# Patient Record
Sex: Female | Born: 1940 | Race: Black or African American | Hispanic: No | Marital: Single | Smoking: Former smoker
Health system: Southern US, Community
[De-identification: ages and names within clinical notes are randomized; demographics above are authoritative.]

## PROBLEM LIST (undated history)

## (undated) DIAGNOSIS — I10 Essential (primary) hypertension: Secondary | ICD-10-CM

## (undated) DIAGNOSIS — J9690 Respiratory failure, unspecified, unspecified whether with hypoxia or hypercapnia: Secondary | ICD-10-CM

## (undated) DIAGNOSIS — J449 Chronic obstructive pulmonary disease, unspecified: Secondary | ICD-10-CM

## (undated) DIAGNOSIS — R0602 Shortness of breath: Secondary | ICD-10-CM

## (undated) DIAGNOSIS — R7303 Prediabetes: Secondary | ICD-10-CM

## (undated) DIAGNOSIS — I509 Heart failure, unspecified: Secondary | ICD-10-CM

## (undated) HISTORY — DX: Essential (primary) hypertension: I10

## (undated) HISTORY — DX: Chronic obstructive pulmonary disease, unspecified: J44.9

## (undated) HISTORY — DX: Prediabetes: R73.03

## (undated) HISTORY — DX: Heart failure, unspecified: I50.9

---

## 2012-08-08 DIAGNOSIS — Z23 Encounter for immunization: Secondary | ICD-10-CM | POA: Diagnosis not present

## 2013-07-15 DIAGNOSIS — I509 Heart failure, unspecified: Secondary | ICD-10-CM

## 2013-07-15 HISTORY — DX: Heart failure, unspecified: I50.9

## 2013-07-19 ENCOUNTER — Encounter (HOSPITAL_COMMUNITY): Payer: Self-pay | Admitting: Emergency Medicine

## 2013-07-19 ENCOUNTER — Emergency Department (HOSPITAL_COMMUNITY): Payer: Medicare Other

## 2013-07-19 ENCOUNTER — Inpatient Hospital Stay (HOSPITAL_COMMUNITY)
Admission: EM | Admit: 2013-07-19 | Discharge: 2013-07-29 | DRG: 291 | Disposition: A | Discharge status: Rehabilitation Facility | Payer: Medicare Other | Attending: Family Medicine | Admitting: Family Medicine

## 2013-07-19 DIAGNOSIS — I472 Ventricular tachycardia, unspecified: Secondary | ICD-10-CM | POA: Diagnosis not present

## 2013-07-19 DIAGNOSIS — I2789 Other specified pulmonary heart diseases: Secondary | ICD-10-CM | POA: Diagnosis present

## 2013-07-19 DIAGNOSIS — I10 Essential (primary) hypertension: Secondary | ICD-10-CM

## 2013-07-19 DIAGNOSIS — F17200 Nicotine dependence, unspecified, uncomplicated: Secondary | ICD-10-CM | POA: Diagnosis present

## 2013-07-19 DIAGNOSIS — J40 Bronchitis, not specified as acute or chronic: Secondary | ICD-10-CM | POA: Diagnosis not present

## 2013-07-19 DIAGNOSIS — I613 Nontraumatic intracerebral hemorrhage in brain stem: Secondary | ICD-10-CM

## 2013-07-19 DIAGNOSIS — D696 Thrombocytopenia, unspecified: Secondary | ICD-10-CM | POA: Diagnosis not present

## 2013-07-19 DIAGNOSIS — E872 Acidosis, unspecified: Secondary | ICD-10-CM | POA: Diagnosis not present

## 2013-07-19 DIAGNOSIS — G9340 Encephalopathy, unspecified: Secondary | ICD-10-CM

## 2013-07-19 DIAGNOSIS — G929 Unspecified toxic encephalopathy: Secondary | ICD-10-CM | POA: Diagnosis not present

## 2013-07-19 DIAGNOSIS — J9600 Acute respiratory failure, unspecified whether with hypoxia or hypercapnia: Secondary | ICD-10-CM | POA: Diagnosis not present

## 2013-07-19 DIAGNOSIS — I5022 Chronic systolic (congestive) heart failure: Secondary | ICD-10-CM

## 2013-07-19 DIAGNOSIS — J438 Other emphysema: Secondary | ICD-10-CM | POA: Diagnosis not present

## 2013-07-19 DIAGNOSIS — E8729 Other acidosis: Secondary | ICD-10-CM | POA: Diagnosis present

## 2013-07-19 DIAGNOSIS — I509 Heart failure, unspecified: Secondary | ICD-10-CM

## 2013-07-19 DIAGNOSIS — I4729 Other ventricular tachycardia: Secondary | ICD-10-CM | POA: Diagnosis not present

## 2013-07-19 DIAGNOSIS — I6350 Cerebral infarction due to unspecified occlusion or stenosis of unspecified cerebral artery: Secondary | ICD-10-CM | POA: Diagnosis not present

## 2013-07-19 DIAGNOSIS — G9389 Other specified disorders of brain: Secondary | ICD-10-CM | POA: Diagnosis not present

## 2013-07-19 DIAGNOSIS — K5900 Constipation, unspecified: Secondary | ICD-10-CM | POA: Diagnosis present

## 2013-07-19 DIAGNOSIS — R0602 Shortness of breath: Secondary | ICD-10-CM | POA: Diagnosis not present

## 2013-07-19 DIAGNOSIS — J441 Chronic obstructive pulmonary disease with (acute) exacerbation: Secondary | ICD-10-CM | POA: Diagnosis present

## 2013-07-19 DIAGNOSIS — I319 Disease of pericardium, unspecified: Secondary | ICD-10-CM | POA: Diagnosis not present

## 2013-07-19 DIAGNOSIS — E876 Hypokalemia: Secondary | ICD-10-CM | POA: Diagnosis not present

## 2013-07-19 DIAGNOSIS — E870 Hyperosmolality and hypernatremia: Secondary | ICD-10-CM | POA: Diagnosis present

## 2013-07-19 DIAGNOSIS — D649 Anemia, unspecified: Secondary | ICD-10-CM | POA: Diagnosis present

## 2013-07-19 DIAGNOSIS — I5031 Acute diastolic (congestive) heart failure: Principal | ICD-10-CM | POA: Diagnosis present

## 2013-07-19 DIAGNOSIS — J90 Pleural effusion, not elsewhere classified: Secondary | ICD-10-CM | POA: Diagnosis not present

## 2013-07-19 DIAGNOSIS — I169 Hypertensive crisis, unspecified: Secondary | ICD-10-CM

## 2013-07-19 DIAGNOSIS — I619 Nontraumatic intracerebral hemorrhage, unspecified: Secondary | ICD-10-CM | POA: Diagnosis not present

## 2013-07-19 DIAGNOSIS — I160 Hypertensive urgency: Secondary | ICD-10-CM

## 2013-07-19 HISTORY — DX: Respiratory failure, unspecified, unspecified whether with hypoxia or hypercapnia: J96.90

## 2013-07-19 HISTORY — DX: Heart failure, unspecified: I50.9

## 2013-07-19 HISTORY — DX: Chronic obstructive pulmonary disease, unspecified: J44.9

## 2013-07-19 HISTORY — DX: Essential (primary) hypertension: I10

## 2013-07-19 HISTORY — DX: Shortness of breath: R06.02

## 2013-07-19 LAB — URINALYSIS, ROUTINE W REFLEX MICROSCOPIC
Bilirubin Urine: NEGATIVE
Glucose, UA: NEGATIVE mg/dL
KETONES UR: NEGATIVE mg/dL
LEUKOCYTES UA: NEGATIVE
NITRITE: NEGATIVE
PROTEIN: 100 mg/dL — AB
Specific Gravity, Urine: 1.016 (ref 1.005–1.030)
UROBILINOGEN UA: 1 mg/dL (ref 0.0–1.0)
pH: 5.5 (ref 5.0–8.0)

## 2013-07-19 LAB — CBC WITH DIFFERENTIAL/PLATELET
BASOS PCT: 0 % (ref 0–1)
Basophils Absolute: 0 10*3/uL (ref 0.0–0.1)
Eosinophils Absolute: 0.1 10*3/uL (ref 0.0–0.7)
Eosinophils Relative: 2 % (ref 0–5)
HCT: 36.5 % (ref 36.0–46.0)
HEMOGLOBIN: 11.8 g/dL — AB (ref 12.0–15.0)
Lymphocytes Relative: 24 % (ref 12–46)
Lymphs Abs: 1.1 10*3/uL (ref 0.7–4.0)
MCH: 25.7 pg — ABNORMAL LOW (ref 26.0–34.0)
MCHC: 32.3 g/dL (ref 30.0–36.0)
MCV: 79.3 fL (ref 78.0–100.0)
MONOS PCT: 10 % (ref 3–12)
Monocytes Absolute: 0.5 10*3/uL (ref 0.1–1.0)
NEUTROS ABS: 2.9 10*3/uL (ref 1.7–7.7)
NEUTROS PCT: 63 % (ref 43–77)
PLATELETS: 108 10*3/uL — AB (ref 150–400)
RBC: 4.6 MIL/uL (ref 3.87–5.11)
RDW: 17.6 % — ABNORMAL HIGH (ref 11.5–15.5)
WBC: 4.6 10*3/uL (ref 4.0–10.5)

## 2013-07-19 LAB — BASIC METABOLIC PANEL
BUN: 9 mg/dL (ref 6–23)
CO2: 31 mEq/L (ref 19–32)
Calcium: 8.5 mg/dL (ref 8.4–10.5)
Chloride: 108 mEq/L (ref 96–112)
Creatinine, Ser: 0.8 mg/dL (ref 0.50–1.10)
GFR, EST AFRICAN AMERICAN: 84 mL/min — AB (ref >90)
GFR, EST NON AFRICAN AMERICAN: 72 mL/min — AB (ref >90)
Glucose, Bld: 112 mg/dL — ABNORMAL HIGH (ref 70–99)
POTASSIUM: 3.7 meq/L (ref 3.7–5.3)
SODIUM: 149 meq/L — AB (ref 137–147)

## 2013-07-19 LAB — URINE MICROSCOPIC-ADD ON

## 2013-07-19 LAB — POCT I-STAT 3, ART BLOOD GAS (G3+)
Acid-Base Excess: 4 mmol/L — ABNORMAL HIGH (ref 0.0–2.0)
Bicarbonate: 31.6 mEq/L — ABNORMAL HIGH (ref 20.0–24.0)
O2 SAT: 96 %
PCO2 ART: 63.9 mmHg — AB (ref 35.0–45.0)
PH ART: 7.302 — AB (ref 7.350–7.450)
PO2 ART: 97 mmHg (ref 80.0–100.0)
Patient temperature: 98.6
TCO2: 33 mmol/L (ref 0–100)

## 2013-07-19 LAB — PRO B NATRIURETIC PEPTIDE: PRO B NATRI PEPTIDE: 11566 pg/mL — AB (ref 0–125)

## 2013-07-19 LAB — TROPONIN I

## 2013-07-19 MED ORDER — FUROSEMIDE 10 MG/ML IJ SOLN
40.0000 mg | Freq: Once | INTRAMUSCULAR | Status: AC
  Administered 2013-07-19: 40 mg via INTRAVENOUS
  Filled 2013-07-19: qty 4

## 2013-07-19 MED ORDER — SODIUM CHLORIDE 0.9 % IJ SOLN
3.0000 mL | Freq: Two times a day (BID) | INTRAMUSCULAR | Status: DC
  Administered 2013-07-20 – 2013-07-29 (×12): 3 mL via INTRAVENOUS

## 2013-07-19 MED ORDER — ASPIRIN EC 81 MG PO TBEC
81.0000 mg | DELAYED_RELEASE_TABLET | Freq: Every day | ORAL | Status: DC
  Administered 2013-07-19 – 2013-07-21 (×3): 81 mg via ORAL
  Filled 2013-07-19 (×3): qty 1

## 2013-07-19 MED ORDER — ACETAMINOPHEN 650 MG RE SUPP: 650.0000 mg | Freq: Four times a day (QID) | RECTAL | Status: DC | PRN

## 2013-07-19 MED ORDER — FUROSEMIDE 10 MG/ML IJ SOLN
40.0000 mg | Freq: Three times a day (TID) | INTRAMUSCULAR | Status: DC
  Administered 2013-07-19 – 2013-07-20 (×3): 40 mg via INTRAVENOUS
  Filled 2013-07-19 (×4): qty 4

## 2013-07-19 MED ORDER — NITROGLYCERIN IN D5W 200-5 MCG/ML-% IV SOLN
2.0000 ug/min | INTRAVENOUS | Status: DC
Start: 2013-07-19 — End: 2013-07-20
  Administered 2013-07-19: 10 ug/min via INTRAVENOUS
  Administered 2013-07-20: 175 ug/min via INTRAVENOUS
  Filled 2013-07-19 (×4): qty 250

## 2013-07-19 MED ORDER — IPRATROPIUM-ALBUTEROL 0.5-2.5 (3) MG/3ML IN SOLN
3.0000 mL | RESPIRATORY_TRACT | Status: DC | PRN
  Administered 2013-07-28 (×2): 3 mL via RESPIRATORY_TRACT
  Filled 2013-07-19 (×2): qty 3

## 2013-07-19 MED ORDER — FUROSEMIDE 10 MG/ML IJ SOLN: 20.0000 mg | Freq: Once | INTRAMUSCULAR | Status: DC

## 2013-07-19 MED ORDER — SODIUM CHLORIDE 0.9 % IJ SOLN: 3.0000 mL | INTRAMUSCULAR | Status: DC | PRN

## 2013-07-19 MED ORDER — SODIUM CHLORIDE 0.9 % IV SOLN: 250.0000 mL | INTRAVENOUS | Status: DC | PRN

## 2013-07-19 MED ORDER — HEPARIN SODIUM (PORCINE) 5000 UNIT/ML IJ SOLN
5000.0000 [IU] | Freq: Three times a day (TID) | INTRAMUSCULAR | Status: DC
  Administered 2013-07-19 – 2013-07-21 (×5): 5000 [IU] via SUBCUTANEOUS
  Filled 2013-07-19 (×8): qty 1

## 2013-07-19 MED ORDER — LOSARTAN POTASSIUM 50 MG PO TABS
100.0000 mg | ORAL_TABLET | Freq: Every day | ORAL | Status: DC
  Administered 2013-07-19 – 2013-07-20 (×2): 100 mg via ORAL
  Filled 2013-07-19 (×2): qty 2

## 2013-07-19 MED ORDER — SODIUM CHLORIDE 0.9 % IJ SOLN
3.0000 mL | Freq: Two times a day (BID) | INTRAMUSCULAR | Status: DC
  Administered 2013-07-21 – 2013-07-27 (×6): 3 mL via INTRAVENOUS

## 2013-07-19 MED ORDER — ACETAMINOPHEN 325 MG PO TABS: 650.0000 mg | ORAL_TABLET | Freq: Four times a day (QID) | ORAL | Status: DC | PRN

## 2013-07-19 NOTE — ED Notes (Signed)
[facility] following a eval. For CHF. And hypertension. No past Hx of HTN or CHF. Pt is not taking any MEDS.

## 2013-07-19 NOTE — H&P (Signed)
Family Medicine Teaching [facility] Ad[facility] History and Physical Service Pager: [phone]  Patient name: Angelica Mcbride Medical record number: [id number] Date of birth: 04/20/1941 Age: 73 y.o. Gender: female  Primary Care Provider: No PCP Per Patient  Chief Complaint: Dysonea  Assessment and Plan: Angelica Mcbride is a 73 y.o. year old female presenting with acute congestive heart failure with the background of likely COPD and HTN. Her dyspnea is concerning for multifactorial etiology, however for now we'll focus on treating her for volume overload due to acute CHF and begin treating for COPD exacerbation if she develops fevers or does not improve as expected.  Dyspnea: Likely acute CHF on COPD. - Considering hypertensive urgency and the nitro drip we'll admit to step down unit. - Respiratory acidosis with CO2 of 63 and pH of 7.30 with some compensation versus evidence of chronic CO2 retention with bicarbonate of 31. - EKG with left axis deviation, left ventricle hypertrophy, and nonspecific flipped T waves in lateral leads - proBNP 11K, pulmonary congestion on CXR - Diurese with 40 IV Lasix TID, will hold on treatment for COPD exacerbation - Risk Stratification labs: A1c, TSH, lipid panel - Strict I/Os, TTE in the am - Rule out ACS with 2 additional Troponins and repeat EKG - Duonebs with wheezing, however this is possibly cardiac wheezing  Hypertension with Hypertensive urgency  - Nitro drip started in ED, continue in Step down - Rule out ACS - Continue  - monitor  Hypernatremia, mild - Sodium 149, follow BMP in a.m. - No treatment necessary at this time   FEN/GI: Heart healthy diet, Strict I/O, Saline Lock IV Prophylaxis: Heparin Disposition: Step down, Tele when transitioned off of Nitro drip Code Status: Full  History of Present Illness: Angelica Mcbride is a 73 y.o. year old female presenting with dyspnea for 2 weeks. She states that her current episode began with  bilateral lower extremity edema about 2 months ago. Her legs continued to swell since then. For the past 2 weeks she's noticed progressive dyspnea, 3 pillow orthopnea, and PND. She also notes a 50-pack-year history of smoking and approximately 40 years of chronic cough productive of thick yellow or green sputum. She denies any worsening cough, sputum production, fever, or chills. She states that she has not seen a doctor in over 20 years and does not know of any medical problems that she has. She denies any history of surgeries and has had one child at the age of 73. She denies headache, vision change, weakness, numbness, chest pain, hematochezia, melena, dysuria, or confusion. She states that currently it hurts to bend her knees as they are so swollen.  IOn the ED she's been treated with 40 mg IV Lasix which produced good urine output. She states that she is breathing easier since the diuretic was given.   Patient Active Problem List   Diagnosis Date Noted  . Congestive heart failure 07/19/2013  . Hypertensive urgency 07/19/2013   Past Medical History: History reviewed. No pertinent past medical history. Past Surgical History: History reviewed. No pertinent past surgical history. Social History: History  Substance Use Topics  . Smoking status: Current Every Day Smoker -- 1.00 packs/day for 51 years    Types: Cigarettes  . Smokeless tobacco: Not on file  . Alcohol Use: No   For any additional social history documentation, please refer to relevant sections of EMR.  Family History: Family History  Problem Relation Age of Onset  . Diabetes Daughter    Allergies: No Known  Allergies No current facility-administered medications on file prior to encounter.   No current outpatient prescriptions on file prior to encounter.   Review Of Systems: Per HPI, Otherwise 12 point review of systems was performed and was unremarkable.  Physical Exam: BP 179/96  Pulse 86  Temp(Src) 97.5 F (36.4  C) (Oral)  Resp 28  Ht 5' 7.5" (1.715 m)  Wt 141 lb 5 oz (64.099 kg)  BMI 21.79 kg/m2  SpO2 97% Exam: Gen: NAD, alert, cooperative with exam HEENT: NCAT, EOMI, PERRL, MMM CV: RRR, good S1/S2, no murmur Resp: Diffuse expiratory wheeze, overall diminished breath sounds, mild increased work of breathing Abd: SNTND, BS present, no guarding or organomegaly Ext: No edema, warm Neuro: Alert and oriented, strength 4/5 in BL Upper and lower extremities, EOMI, normal sensation throughout Skin: no visible lesions  Labs and Imaging: CBC BMET   Recent Labs Lab 07/19/13 1310  WBC 4.6  HGB 11.8*  HCT 36.5  PLT 108*    Recent Labs Lab 07/19/13 1310  NA 149*  K 3.7  CL 108  CO2 31  BUN 9  CREATININE 0.80  GLUCOSE 112*  CALCIUM 8.5     ProBNP: 11,566 UA: Small hemoglobin, 100 protein, Michael a with Hyline casts  Recent Labs Lab 07/19/13 1347  PHART 7.302*  PCO2ART 63.9*  PO2ART 97.0  HCO3 31.6*  TCO2 33  O2SAT 96.0   EKG with left axis deviation, left ventricle hypertrophy, and nonspecific flipped T waves in lateral leads  Kevin Fenton, MD 07/19/2013, 7:10 PM

## 2013-07-19 NOTE — ED Provider Notes (Signed)
CSN: [id number]     Arrival date & time 07/19/13  1230 History   First MD Initiated Contact with Patient 07/19/13 1253     Chief Complaint  Patient presents with  . Shortness of Breath   (Consider location/radiation/quality/duration/timing/severity/associated sxs/prior Treatment) HPI Comments: Angelica Mcbride is a 73 y.o. female who presents for evaluation of leg swelling shortness of breath and sleepiness. The bones are persistent, and ongoing and worsening for several weeks. The patient has been sitting, sleeping, up in an office chair for many years. She does not get regular medical treatments of any type. Her daughter took her to an urgent care center today, who felt that she was in acute congestive heart failure, so sent her here. The patient has not had a recent headache, chest pain, back, pain or leg pain. She does have difficulty walking, secondary to the swelling in her legs. There are no other known modifying factors.   Patient is a 74 y.o. female presenting with shortness of breath. The history is provided by the patient and a relative.  Shortness of Breath   History reviewed. No pertinent past medical history. History reviewed. No pertinent past surgical history. No family history on file. History  Substance Use Topics  . Smoking status: Never Smoker   . Smokeless tobacco: Not on file  . Alcohol Use: No   OB History   Grav Para Term Preterm Abortions TAB SAB Ect Mult Living                 Review of Systems  Respiratory: Positive for shortness of breath.   All other systems reviewed and are negative.    Allergies  Review of patient's allergies indicates no known allergies.  Home Medications   Current Outpatient Rx  Name  Route  Sig  Dispense  Refill  . acetaminophen (TYLENOL) 500 MG tablet   Oral   Take 500 mg by mouth every 8 (eight) hours as needed for mild pain.         Marland Kitchen EPINEPHrine Base (PRIMATENE MIST IN)   Inhalation   Inhale 1-2 puffs into the lungs  every 8 (eight) hours as needed (for congestion).         Marland Kitchen guaiFENesin (MUCINEX) 600 MG 12 hr tablet   Oral   Take 600 mg by mouth 2 (two) times daily as needed for to loosen phlegm.          BP 214/173  Pulse 101  Temp(Src) 97.5 F (36.4 C) (Oral)  Resp 28  Ht 5' 7.5" (1.715 m)  Wt 141 lb 5 oz (64.099 kg)  BMI 21.79 kg/m2  SpO2 93% Physical Exam  Nursing note and vitals reviewed. Constitutional: She is oriented to person, place, and time. She appears well-developed.  Elderly, frail  HENT:  Head: Normocephalic and atraumatic.  Right Ear: External ear normal.  Left Ear: External ear normal.  Eyes: Conjunctivae and EOM are normal. Pupils are equal, round, and reactive to light.  Neck: Normal range of motion and phonation normal. Neck supple.  Cardiovascular: Normal rate, regular rhythm, normal heart sounds and intact distal pulses.   Pulmonary/Chest: Effort normal. She exhibits no bony tenderness.  Decreased breath sounds, bilateral  Abdominal: Soft. Normal appearance. There is no tenderness.  Musculoskeletal: Normal range of motion. She exhibits edema. She exhibits no tenderness.  Bilateral lower extremity  Neurological: She is alert and oriented to person, place, and time. No cranial nerve deficit or sensory deficit. She exhibits normal muscle tone.  Coordination normal.  She is somewhat sleepy, but arousable and lucid.  Skin: Skin is warm, dry and intact.  Psychiatric: She has a normal mood and affect. Her behavior is normal.    ED Course  Procedures (including critical care time) Medications  nitroGLYCERIN 0.2 mg/mL in dextrose 5 % infusion (not administered)  furosemide (LASIX) injection 40 mg (40 mg Intravenous Given 07/19/13 1645)     Patient Vitals for the past 24 hrs:  BP Temp Temp src Pulse Resp SpO2 Height Weight  07/19/13 1645 214/173 mmHg - - 101 - 93 % - -  07/19/13 1630 219/127 mmHg - - 109 - 96 % - -  07/19/13 1615 174/98 mmHg - - 84 - 97 % - -   07/19/13 1600 170/96 mmHg - - 81 - 98 % - -  07/19/13 1545 194/117 mmHg - - 100 - 97 % - -  07/19/13 1530 179/105 mmHg - - 84 - 94 % - -  07/19/13 1517 194/108 mmHg 97.5 F (36.4 C) Oral 90 28 96 % - -  07/19/13 1515 191/115 mmHg - - 87 - 96 % - -  07/19/13 1500 196/123 mmHg - - 98 - 96 % - -  07/19/13 1445 192/117 mmHg - - 95 - 95 % - -  07/19/13 1430 207/118 mmHg - - 96 - 96 % - -  07/19/13 1415 191/114 mmHg - - 85 - 98 % - -  07/19/13 1400 183/106 mmHg - - 91 28 98 % - -  07/19/13 1345 189/113 mmHg - - 91 - 98 % - -  07/19/13 1330 189/110 mmHg - - 92 21 98 % - -  07/19/13 1315 174/123 mmHg - - 91 28 99 % - -  07/19/13 1312 152/92 mmHg - - 94 28 100 % 5' 7.5" (1.715 m) 141 lb 5 oz (64.099 kg)  07/19/13 1234 198/110 mmHg 97.8 F (36.6 C) Oral 93 35 92 % - -   5:25 PM Reevaluation with update and discussion. After initial assessment and treatment, an updated evaluation reveals she remains alert and comfortable. BP has increased significantly in the last hour. NTG drip ordered . Rosi Secrist L    CRITICAL CARE Performed by: Mancel BaleWENTZ,Charmin Aguiniga L Total critical care time: 50 minutes Critical care time was exclusive of separately billable procedures and treating other patients. Critical care was necessary to treat or prevent imminent or life-threatening deterioration. Critical care was time spent personally by me on the following activities: development of treatment plan with patient and/or surrogate as well as nursing, discussions with consultants, evaluation of patient's response to treatment, examination of patient, obtaining history from patient or surrogate, ordering and performing treatments and interventions, ordering and review of laboratory studies, ordering and review of radiographic studies, pulse oximetry and re-evaluation of patient's condition.  Labs Review Labs Reviewed  CBC WITH DIFFERENTIAL - Abnormal; Notable for the following:    Hemoglobin 11.8 (*)    MCH 25.7 (*)     RDW 17.6 (*)    Platelets 108 (*)    All other components within normal limits  BASIC METABOLIC PANEL - Abnormal; Notable for the following:    Sodium 149 (*)    Glucose, Bld 112 (*)    GFR calc non Af Amer 72 (*)    GFR calc Af Amer 84 (*)    All other components within normal limits  PRO B NATRIURETIC PEPTIDE - Abnormal; Notable for the following:    Pro B Natriuretic peptide (BNP)  11566.0 (*)    All other components within normal limits  URINALYSIS, ROUTINE W REFLEX MICROSCOPIC - Abnormal; Notable for the following:    Hgb urine dipstick SMALL (*)    Protein, ur 100 (*)    All other components within normal limits  URINE MICROSCOPIC-ADD ON - Abnormal; Notable for the following:    Casts HYALINE CASTS (*)    All other components within normal limits  POCT I-STAT 3, BLOOD GAS (G3+) - Abnormal; Notable for the following:    pH, Arterial 7.302 (*)    pCO2 arterial 63.9 (*)    Bicarbonate 31.6 (*)    Acid-Base Excess 4.0 (*)    All other components within normal limits  URINE CULTURE  TROPONIN I  BLOOD GAS, ARTERIAL   Imaging Review Dg Chest Port 1 View  07/19/2013   CLINICAL DATA:  Shortness of breath, history smoking  EXAM: PORTABLE CHEST - 1 VIEW  COMPARISON:  Portable exam 1703 hr without priors for comparison  FINDINGS: Enlargement of cardiac silhouette with pulmonary vascular congestion.  Mediastinal contours normal.  Bibasilar pleural effusions and atelectasis.  Minimal accentuation of perihilar markings the question bronchitis.  No definite acute pulmonary edema or segmental consolidation.  No pneumothorax.  Bones demineralized.  IMPRESSION: Enlargement of cardiac silhouette with pulmonary vascular congestion.  Bronchitic changes with bibasilar effusions and probable mild bibasilar atelectasis.   Electronically Signed   By: Ulyses Southward M.D.   On: 07/19/2013 17:17    EKG Interpretation    Date/Time:  Monday July 19 2013 12:41:34 EST Ventricular Rate:  84 PR  Interval:  159 QRS Duration: 92 QT Interval:  411 QTC Calculation: 486 R Axis:   -56 Text Interpretation:  Sinus rhythm Probable left atrial enlargement Left anterior fascicular block Abnormal R-wave progression, late transition Left ventricular hypertrophy Nonspecific T abnormalities, lateral leads ST elevation, consider inferior injury Borderline prolonged QT interval No old tracing to compare Confirmed by [facility]  MD, Obryan Radu ([id number]) on 07/19/2013 4:35:53 PM            MDM   1. Congestive heart failure   2. Hypertensive crisis    Hypertensive crisis, new onset with congestive heart failure. She will require aggressive treatment with diuresis, nitrates, and close monitoring in a step down unit. Patient remained lucid and cooperative during the evaluation. Doubt CVA, significant bacterial infection or instability.   Nursing Notes Reviewed/ Care Coordinated, and agree without changes. Applicable Imaging Reviewed.  Interpretation of Laboratory Data incorporated into ED treatment   Plan: Admit    Flint Melter, MD 07/19/13 1752

## 2013-07-20 DIAGNOSIS — I319 Disease of pericardium, unspecified: Secondary | ICD-10-CM | POA: Diagnosis not present

## 2013-07-20 DIAGNOSIS — G9340 Encephalopathy, unspecified: Secondary | ICD-10-CM | POA: Diagnosis not present

## 2013-07-20 DIAGNOSIS — J9600 Acute respiratory failure, unspecified whether with hypoxia or hypercapnia: Secondary | ICD-10-CM | POA: Diagnosis not present

## 2013-07-20 DIAGNOSIS — E872 Acidosis, unspecified: Secondary | ICD-10-CM

## 2013-07-20 DIAGNOSIS — I5031 Acute diastolic (congestive) heart failure: Secondary | ICD-10-CM | POA: Diagnosis not present

## 2013-07-20 DIAGNOSIS — I509 Heart failure, unspecified: Secondary | ICD-10-CM | POA: Diagnosis not present

## 2013-07-20 DIAGNOSIS — I10 Essential (primary) hypertension: Secondary | ICD-10-CM | POA: Diagnosis not present

## 2013-07-20 DIAGNOSIS — I6350 Cerebral infarction due to unspecified occlusion or stenosis of unspecified cerebral artery: Secondary | ICD-10-CM | POA: Diagnosis not present

## 2013-07-20 LAB — BASIC METABOLIC PANEL
BUN: 8 mg/dL (ref 6–23)
BUN: 7 mg/dL (ref 6–23)
CALCIUM: 8.2 mg/dL — AB (ref 8.4–10.5)
CALCIUM: 8.4 mg/dL (ref 8.4–10.5)
CO2: 39 meq/L — AB (ref 19–32)
CO2: 44 mEq/L (ref 19–32)
Chloride: 99 mEq/L (ref 96–112)
Chloride: 97 mEq/L (ref 96–112)
Creatinine, Ser: 0.81 mg/dL (ref 0.50–1.10)
Creatinine, Ser: 0.93 mg/dL (ref 0.50–1.10)
GFR calc Af Amer: 70 mL/min — ABNORMAL LOW (ref >90)
GFR calc non Af Amer: 71 mL/min — ABNORMAL LOW (ref >90)
GFR calc non Af Amer: 60 mL/min — ABNORMAL LOW (ref >90)
GFR, EST AFRICAN AMERICAN: 83 mL/min — AB (ref >90)
GLUCOSE: 111 mg/dL — AB (ref 70–99)
Glucose, Bld: 115 mg/dL — ABNORMAL HIGH (ref 70–99)
Potassium: 3.4 mEq/L — ABNORMAL LOW (ref 3.7–5.3)
Potassium: 3.8 mEq/L (ref 3.7–5.3)
SODIUM: 146 meq/L (ref 137–147)
Sodium: 147 mEq/L (ref 137–147)

## 2013-07-20 LAB — URINE CULTURE
Colony Count: NO GROWTH
Culture: NO GROWTH

## 2013-07-20 LAB — POCT I-STAT 3, ART BLOOD GAS (G3+)
ACID-BASE EXCESS: 17 mmol/L — AB (ref 0.0–2.0)
Bicarbonate: 45.3 mEq/L — ABNORMAL HIGH (ref 20.0–24.0)
O2 Saturation: 81 %
PO2 ART: 49 mmHg — AB (ref 80.0–100.0)
TCO2: 47 mmol/L (ref 0–100)
pCO2 arterial: 71.4 mmHg (ref 35.0–45.0)
pH, Arterial: 7.412 (ref 7.350–7.450)

## 2013-07-20 LAB — CBC
HCT: 34.7 % — ABNORMAL LOW (ref 36.0–46.0)
Hemoglobin: 11 g/dL — ABNORMAL LOW (ref 12.0–15.0)
MCH: 25.6 pg — AB (ref 26.0–34.0)
MCHC: 31.7 g/dL (ref 30.0–36.0)
MCV: 80.7 fL (ref 78.0–100.0)
PLATELETS: 96 10*3/uL — AB (ref 150–400)
RBC: 4.3 MIL/uL (ref 3.87–5.11)
RDW: 17.1 % — ABNORMAL HIGH (ref 11.5–15.5)
WBC: 7.1 10*3/uL (ref 4.0–10.5)

## 2013-07-20 LAB — LIPID PANEL
Cholesterol: 132 mg/dL (ref 0–200)
HDL: 40 mg/dL (ref >39)
LDL Cholesterol: 80 mg/dL (ref 0–99)
Total CHOL/HDL Ratio: 3.3 RATIO
Triglycerides: 62 mg/dL (ref <150)
VLDL: 12 mg/dL (ref 0–40)

## 2013-07-20 LAB — HEMOGLOBIN A1C
HEMOGLOBIN A1C: 6.3 % — AB (ref <5.7)
Mean Plasma Glucose: 134 mg/dL — ABNORMAL HIGH (ref <117)

## 2013-07-20 LAB — TROPONIN I: Troponin I: <0.3 ng/mL (ref <0.30)

## 2013-07-20 LAB — MRSA PCR SCREENING: MRSA by PCR: NEGATIVE

## 2013-07-20 LAB — TSH: TSH: 0.657 u[IU]/mL (ref 0.350–4.500)

## 2013-07-20 LAB — GLUCOSE, CAPILLARY
GLUCOSE-CAPILLARY: 124 mg/dL — AB (ref 70–99)
GLUCOSE-CAPILLARY: 120 mg/dL — AB (ref 70–99)

## 2013-07-20 LAB — LACTIC ACID, PLASMA: Lactic Acid, Venous: 0.9 mmol/L (ref 0.5–2.2)

## 2013-07-20 MED ORDER — PNEUMOCOCCAL VAC POLYVALENT 25 MCG/0.5ML IJ INJ
0.5000 mL | INJECTION | INTRAMUSCULAR | Status: AC
  Administered 2013-07-25: 0.5 mL via INTRAMUSCULAR
  Filled 2013-07-20 (×2): qty 0.5

## 2013-07-20 MED ORDER — FUROSEMIDE 10 MG/ML IJ SOLN
40.0000 mg | Freq: Every day | INTRAMUSCULAR | Status: DC
  Administered 2013-07-21 – 2013-07-23 (×3): 40 mg via INTRAVENOUS
  Filled 2013-07-20 (×4): qty 4

## 2013-07-20 MED ORDER — POTASSIUM CHLORIDE CRYS ER 20 MEQ PO TBCR
40.0000 meq | EXTENDED_RELEASE_TABLET | Freq: Once | ORAL | Status: AC
  Administered 2013-07-20: 40 meq via ORAL
  Filled 2013-07-20: qty 2

## 2013-07-20 MED ORDER — HYDROCHLOROTHIAZIDE 10 MG/ML ORAL SUSPENSION: 6.2500 mg | Freq: Every day | ORAL | Status: DC

## 2013-07-20 MED ORDER — INFLUENZA VAC SPLIT QUAD 0.5 ML IM SUSP
0.5000 mL | INTRAMUSCULAR | Status: AC
  Administered 2013-07-25: 0.5 mL via INTRAMUSCULAR
  Filled 2013-07-20 (×2): qty 0.5

## 2013-07-20 MED ORDER — AMLODIPINE BESYLATE 5 MG PO TABS
5.0000 mg | ORAL_TABLET | Freq: Every day | ORAL | Status: DC
  Administered 2013-07-20: 5 mg via ORAL
  Filled 2013-07-20 (×2): qty 1

## 2013-07-20 MED ORDER — LOSARTAN POTASSIUM 50 MG PO TABS
50.0000 mg | ORAL_TABLET | Freq: Every day | ORAL | Status: DC
  Administered 2013-07-21: 50 mg via ORAL
  Filled 2013-07-20 (×3): qty 1

## 2013-07-20 NOTE — Progress Notes (Signed)
Family Medicine Teaching Service Daily Progress Note Intern Pager: [phone]  Patient name: Angelica Mcbride Medical record number: [id number] Date of birth: 29-Mar-1941 Age: 73 y.o. Gender: female  Primary Care Provider: No PCP Per Patient Consultants: None Code Status: Full  Pt Overview and Major Events to Date:  1/5: Admitted for acute CHF  Assessment and Plan: Arnetta Odeh is a 73 y.o. year old female presenting with acute congestive heart failure with the background of likely COPD and HTN. Her dyspnea is concerning for multifactorial etiology, however for now we'll focus on treating her for volume overload due to acute CHF and begin treating for COPD exacerbation if she develops fevers or does not improve as expected.   Acute CHF: History of worsening dyspnea in setting of no medical care. BNP: 11566.0.  - CXR with cardiac enlargement, pulmonary congestion, and bibasilar effusions on bronchitic changes - ECG 1/6 unchanged from 1/5: LAD, LVH, LAFB, and nonspecific flipped T waves in lateral leads  - Continue to diurese with Lasix 40mg  IV TID  - Recheck BMP tomorrow AM - Strict I/Os: ~1L charted UOP - TTE ordered - Risk stratification labs: Hb A1c and TSH pending.  - Lipid panel wnl, LDL 80  Hypertension with Hypertensive urgency  - Still elevated BP (152-246/72-133) though improved as NTG gtt increased (currently 200 mcg/min) - Troponin neg x3  COPD: - Respiratory acidosis with CO2 of 63 and pH of 7.30 with some compensation versus evidence of chronic CO2 retention with bicarbonate of 31.  - will hold on treatment for COPD exacerbation  - Duonebs with wheezing, however this is possibly cardiac wheezing   Hypernatremia, mild: Resolved  - Sodium 149 > 146  FEN/GI: Heart healthy diet, Strict I/O, Saline Lock IV  Prophylaxis: Heparin   Disposition: Continue ntg gtt in SDU, transfer to telemetry once off nitroglycerin  Subjective: Pt denies pain. No CP, SOB, dizziness, HA. No  appetite. Daughter at bedside reports good sleep and ability to get to bedside commode. Declines nicotine patch.   Objective: Temp:  [97.5 F (36.4 C)-98.3 F (36.8 C)] 98.1 F (36.7 C) (01/06 0815) Pulse Rate:  [69-110] 103 (01/06 0815) Resp:  [19-36] 24 (01/06 0815) BP: (152-249)/(72-173) 168/73 mmHg (01/06 0815) SpO2:  [91 %-100 %] 96 % (01/06 0815) Weight:  [128 lb 12 oz (58.4 kg)-141 lb 5 oz (64.099 kg)] 128 lb 12 oz (58.4 kg) (01/06 0500) Physical Exam: Gen: elderly female in NAD sleeping in bed HEENT: NCAT, EOMI, PERRL, MMM  CV: RRR, no murmur  Resp: Diffuse expiratory wheeze, overall diminished breath sounds, mild increased work of breathing  Abd: SNTND, BS present, no guarding or organomegaly  Ext: No edema, warm, no tenderness in bilateral legs.  Neuro: Alert and oriented, strength 4/5 in BL Upper and lower extremities, EOMI, normal sensation throughout   Laboratory:  Recent Labs Lab 07/19/13 1310  WBC 4.6  HGB 11.8*  HCT 36.5  PLT 108*    Recent Labs Lab 07/19/13 1310 07/20/13 0730  NA 149* 146  K 3.7 3.4*  CL 108 99  CO2 31 39*  BUN 9 8  CREATININE 0.80 0.81  CALCIUM 8.5 8.2*  GLUCOSE 112* 115*    07/20/2013 07:30  Cholesterol 132  Triglycerides 62  HDL 40  LDL (calc) 80  VLDL 12  Total CHOL/HDL Ratio 3.3    07/19/2013 13:08  Pro B Natriuretic peptide (BNP) 11566.0 (H)    07/19/2013 13:21  Color, Urine YELLOW  APPearance CLEAR  Specific Gravity, Urine  1.016  pH 5.5  Glucose NEGATIVE  Bilirubin Urine NEGATIVE  Ketones, ur NEGATIVE  Protein 100 (A)  Urobilinogen, UA 1.0  Nitrite NEGATIVE  Leukocytes, UA NEGATIVE  Hgb urine dipstick SMALL (A)  WBC, UA 0-2  RBC / HPF 0-2  Squamous Epithelial / LPF RARE  Bacteria, UA RARE  Casts HYALINE CASTS (A)   Imaging/Diagnostic Tests: CXR 1VW FINDINGS:  Enlargement of cardiac silhouette with pulmonary vascular  congestion.  Mediastinal contours normal.  Bibasilar pleural effusions and  atelectasis.  Minimal accentuation of perihilar markings the question bronchitis.  No definite acute pulmonary edema or segmental consolidation.  No pneumothorax.  Bones demineralized.  IMPRESSION:  Enlargement of cardiac silhouette with pulmonary vascular  congestion.  Bronchitic changes with bibasilar effusions and probable mild  bibasilar atelectasis.   Hazeline Junkeryan Grunz, MD 07/20/2013, 8:41 AM PGY-1, [facility] FPTS Intern pager: [phone], text pages welcome

## 2013-07-20 NOTE — H&P (Signed)
FMTS Attending Admission Note: Keimani Laufer MD [phone] pager office [phone] I  have seen and examined this patient, reviewed their chart. I have discussed this patient with the resident. I agree with the resident's findings, assessment and care plan. 

## 2013-07-20 NOTE — Progress Notes (Addendum)
Called to pt room for worsening lethargy and confusion. Patient markedly somnolent, poor air movement.  GCS of 11. Obtained ABG for concerns of worsening CO2 narcosis and acidosis; CBG normal PH normal, elevated CO2 but fully compensated.  Hypoxia, pt off Oxygen.  Continue Supplemental O2 Blood pressure is markedly lower than in the past.  Has responded extraordinarily well to 5 of amlodipine and 100 of losartan.  Nitroglycerin was stopped and over the next 45 minutes patient's mental status significantly improved with BP returning to SBPs 150s-160s.  Goal <180/110 Likely hypoperfusion due to relative hypotension. Lactate normal.  Continue to monitor.  Andrena MewsMichael D Glender [place], DO [facility] Resident - PGY-3 07/20/2013 11:58 PM

## 2013-07-20 NOTE — Progress Notes (Signed)
Brief Progress Note:  ECHO results reviewed.  No Systolic dysfunction noted but likely diastolic dysfunction given moderate LVH and pulmonary HTN.  BP improved but still elevated.  Plan to add CCB to avoid BB effect on COPD and no indication for HF.  Plan to wean off NITRO.  Lungs with poor air movement  throughout but without wheezes.  No crackles. 2+/4 pitting edema but legs appear to have decreased in size given skin turgor changes.  Continue to diuresis. Will plan to transfer out of SDU once off Nitro drip.  Andrena MewsMichael D Jolan [place], DO [facility] Resident - PGY-3 07/20/2013 2:49 PM

## 2013-07-20 NOTE — Progress Notes (Signed)
  Echocardiogram 2D Echocardiogram has been performed.  Angelica Mcbride, Angelica Mcbride 07/20/2013, 11:53 AM

## 2013-07-20 NOTE — Progress Notes (Signed)
2015: Called MD d/t pt being much more lethargic and unable to answer questions.   2200: Pt much more alert and able to appropriately answer questions and interact in a conversation.

## 2013-07-20 NOTE — Progress Notes (Signed)
FMTS Attending  Note: Angelica Kelsay,MD I  have seen and examined this patient, reviewed their chart. I have discussed this patient with the resident. I agree with the resident's findings, assessment and care plan.  Patient's BP seem to be improving, plan to add another class of antihypertensive to her current regimen of Losartan 100 mg and Lasix. To taper off Nitroglycerin drip. Monitor I/Os and daily weight for CHF, she seem to be doing well and getting well diuresis with Lasix 40 mg IV TID, continue that for now. F/U risk stratification labs and ECHO.

## 2013-07-20 NOTE — Progress Notes (Signed)
CRITICAL VALUE ALERT  Critical value received:  CO2 40  Date of notification:  07-20-13  Time of notification:  2304  Critical value read back:yes  Nurse who received alert:  Neville RouteAllison Horton, RN  MD notified (1st page):  MD aware of results d/t ABG drawn earlier

## 2013-07-20 NOTE — Care Management Note (Addendum)
    Page 1 of 2   07/29/2013     2:44:34 PM   CARE MANAGEMENT NOTE 07/29/2013  Patient:  Angelica Mcbride,Angelica Mcbride   Account Number:  [id number]  Date Initiated:  07/20/2013  Documentation initiated by:  Junius CreamerWELL,DEBBIE  Subjective/Objective Assessment:   adm w chf, htn     Action/Plan:   lives alone   Anticipated DC Date:  07/29/2013   Anticipated DC Plan:  LONG TERM ACUTE CARE (LTAC)  In-house referral  NA      DC Planning Services  CM consult      [facility]  [facility]  DURABLE MEDICAL EQUIPMENT   Choice offered to / List presented to:  NA   DME arranged  WALKER - Lavone NianOLLING      DME agency  [facility]     [facility] L[facility] arranged  HH-2 PT      Status of service:  Completed, signed off Medicare Important Message given?   (If response is "NO", the following Medicare IM given date fields will be blank) Date Medicare IM given:   Date Additional Medicare IM given:    Discharge Disposition:  HOME W HOME HEALTH SERVICES  Per UR Regulation:  Reviewed for med. necessity/level of care/duration of stay  If discussed at Long Length of Stay Meetings, dates discussed:   07/27/2013    Comments:  07/29/2013 Patient was evlauted for CIR today however, CIR/[name] determined patient  doing well enough to d/c home. HH:  PT and rolling walker.  [name]:  [facility]:  [name]. D/c date 07/29/2013 Donato Schultzrystal Hutchinson RN, BSN, MSHL, CCM 07/29/2013  1/6 0845 debbie dowell rn,bsn will moniter for dc planning needs as pt progresses.

## 2013-07-21 ENCOUNTER — Inpatient Hospital Stay (HOSPITAL_COMMUNITY): Payer: Medicare Other

## 2013-07-21 DIAGNOSIS — G9340 Encephalopathy, unspecified: Secondary | ICD-10-CM | POA: Diagnosis not present

## 2013-07-21 DIAGNOSIS — I509 Heart failure, unspecified: Secondary | ICD-10-CM

## 2013-07-21 DIAGNOSIS — I5031 Acute diastolic (congestive) heart failure: Secondary | ICD-10-CM | POA: Diagnosis not present

## 2013-07-21 DIAGNOSIS — I10 Essential (primary) hypertension: Secondary | ICD-10-CM | POA: Diagnosis not present

## 2013-07-21 DIAGNOSIS — I6350 Cerebral infarction due to unspecified occlusion or stenosis of unspecified cerebral artery: Secondary | ICD-10-CM | POA: Diagnosis not present

## 2013-07-21 DIAGNOSIS — J9600 Acute respiratory failure, unspecified whether with hypoxia or hypercapnia: Secondary | ICD-10-CM | POA: Diagnosis not present

## 2013-07-21 LAB — BASIC METABOLIC PANEL
BUN: 7 mg/dL (ref 6–23)
BUN: 9 mg/dL (ref 6–23)
CALCIUM: 8.2 mg/dL — AB (ref 8.4–10.5)
CHLORIDE: 98 meq/L (ref 96–112)
CO2: 42 mEq/L (ref 19–32)
CO2: 35 mEq/L — ABNORMAL HIGH (ref 19–32)
CREATININE: 0.84 mg/dL (ref 0.50–1.10)
CREATININE: 0.8 mg/dL (ref 0.50–1.10)
Calcium: 8.3 mg/dL — ABNORMAL LOW (ref 8.4–10.5)
Chloride: 94 mEq/L — ABNORMAL LOW (ref 96–112)
GFR calc Af Amer: 79 mL/min — ABNORMAL LOW (ref >90)
GFR calc non Af Amer: 68 mL/min — ABNORMAL LOW (ref >90)
GFR calc non Af Amer: 72 mL/min — ABNORMAL LOW (ref >90)
GFR, EST AFRICAN AMERICAN: 84 mL/min — AB (ref >90)
Glucose, Bld: 102 mg/dL — ABNORMAL HIGH (ref 70–99)
Glucose, Bld: 88 mg/dL (ref 70–99)
Potassium: 3.7 mEq/L (ref 3.7–5.3)
Potassium: 3.6 mEq/L — ABNORMAL LOW (ref 3.7–5.3)
Sodium: 147 mEq/L (ref 137–147)
Sodium: 147 mEq/L (ref 137–147)

## 2013-07-21 LAB — POCT I-STAT 3, ART BLOOD GAS (G3+)
ACID-BASE EXCESS: 17 mmol/L — AB (ref 0.0–2.0)
Acid-Base Excess: 16 mmol/L — ABNORMAL HIGH (ref 0.0–2.0)
Bicarbonate: 46.5 mEq/L — ABNORMAL HIGH (ref 20.0–24.0)
Bicarbonate: 47.1 mEq/L — ABNORMAL HIGH (ref 20.0–24.0)
O2 SAT: 94 %
O2 Saturation: 98 %
PCO2 ART: 86.3 mmHg — AB (ref 35.0–45.0)
PCO2 ART: 85.4 mmHg — AB (ref 35.0–45.0)
PH ART: 7.34 — AB (ref 7.350–7.450)
TCO2: 49 mmol/L (ref 0–100)
TCO2: 50 mmol/L (ref 0–100)
pH, Arterial: 7.349 — ABNORMAL LOW (ref 7.350–7.450)
pO2, Arterial: 126 mmHg — ABNORMAL HIGH (ref 80.0–100.0)
pO2, Arterial: 79 mmHg — ABNORMAL LOW (ref 80.0–100.0)

## 2013-07-21 LAB — MAGNESIUM: Magnesium: 1.3 mg/dL — ABNORMAL LOW (ref 1.5–2.5)

## 2013-07-21 LAB — TROPONIN I: Troponin I: <0.3 ng/mL

## 2013-07-21 MED ORDER — MAGNESIUM SULFATE 50 % IJ SOLN
4.0000 g | Freq: Once | INTRAVENOUS | Status: AC
  Administered 2013-07-21: 4 g via INTRAVENOUS
  Filled 2013-07-21 (×2): qty 8

## 2013-07-21 MED ORDER — POTASSIUM CHLORIDE 10 MEQ/100ML IV SOLN
10.0000 meq | INTRAVENOUS | Status: AC
  Administered 2013-07-21 (×3): 10 meq via INTRAVENOUS
  Filled 2013-07-21 (×3): qty 100

## 2013-07-21 MED ORDER — MAGNESIUM SULFATE 40 MG/ML IJ SOLN
2.0000 g | Freq: Once | INTRAMUSCULAR | Status: AC
  Administered 2013-07-22: 2 g via INTRAVENOUS
  Filled 2013-07-21: qty 50

## 2013-07-21 NOTE — Progress Notes (Signed)
Family Practice Teaching Service Interval Progress Note  Received call from nursing that patient had additional run of Vtach. Murtis SinkSam Bradshaw and I went to evaluate the patient and she continued to be altered. She has been on BiPAP for the past 3 hours and has not had an improvement in her CO2. She has bounced back and forth between bradycardia and tachycardia. Nursing prior to our arrival placed a call to CCM to have them discuss the patient with us. They will come evaluate the patient. A stat head CT was ordered. Orders were placed for troponins to be cycled. EKG was being done while in the room. Will follow-up on BMET and mag as well. Will continue to monitor the patient closely this evening. Appreciate CCM's input on this patient.  Marikay AlarEric Towanna Avery, MD Family Medicine PGY-2 Service Pager [phone]

## 2013-07-21 NOTE — Progress Notes (Signed)
Strict Intake and Output is ordered.  Due to incontinence, most output has not been accurately charted.  All unmeasured events are charted in Epic under unmeasured urine occurences.

## 2013-07-21 NOTE — Clinical Documentation Improvement (Signed)
THIS DOCUMENT IS NOT A PERMANENT PART OF THE MEDICAL RECORD  Please update your documentation with the medical record to reflect your response to this query. If you need help knowing how to do this please call [phone].  07/21/13  Dear Doctor,   Noted diagnosis of "hypertensive urgency". In the Coding world this term equates to "simple benign hypertension". If this is not what you intended, please clarify in Notes and DC summary to illustrate severity of illness and risk of mortality. Thank you.  Possible Clinical Conditions?  - Hypertension - Accelerated Hypertension - Malignant Hypertension - Other Condition (please specify)  Supporting Information: - 214/173-179/96 - NTG drip  You may use possible, probable, or suspect with inpatient documentation. Possible, probable, suspected diagnoses MUST be documented at the time of discharge.  Reviewed: additional documentation in the medical record See addendum to progress note 07/22/13  Thank You, Alycia Rossettiyan B. Jarvis NewcomerGrunz, MD, PGY-1 07/22/2013 4:47 PM

## 2013-07-21 NOTE — Progress Notes (Signed)
CRITICAL VALUE ALERT  Critical value received:  CO2 42  Date of notification:  07-21-2013  Time of notification:  0413  Critical value read back:yes  Nurse who received alert:  Neville RouteAllison Horton, RN  MD notified (1st page):  MD aware of previous critical, lab value improving

## 2013-07-21 NOTE — Progress Notes (Signed)
Family Medicine Teaching Service Daily Progress Note Intern Pager: [phone]  Patient name: Angelica Mcbride Medical record number: [id number] Date of birth: October 14, 1940 Age: 73 y.o. Gender: female  Primary Care Provider: No PCP Per Patient Consultants: None Code Status: Full  Pt Overview and Major Events to Date:  1/5: Admitted for acute CHF, lasix 40mg  IV TID 1/6: Off NTG gtt, losartan and norvasc started 1/7: Down nearly 20 lbs from admission. Decrease lasix to 40mg  IV daily  Assessment and Plan: Angelica Mcbride is a 73 y.o. year old female presenting with acute congestive heart failure with the background of likely COPD and HTN.   Acute CHF: History of worsening dyspnea in setting of no medical care. BNP: 11566.0, CXR with pulmonary congestion and cardiac enlargement. - 2D ECHO: EF 55%, likely diastolic dysfunction with moderate LVH, pulmonary HTN.  - ECG 1/6 unchanged from 1/5: LAD, LVH, LAFB, and nonspecific flipped T waves in lateral leads  - Slow diuresis with Lasix 40mg  IV daily, Cr stable 0.84  - Strict I/Os: Poorly charted quantity due to incontinence, assume more negative balance than charted given weight loss of ~20lbs. since admission - Risk stratification labs: Hb A1c 6.3, TSH 0.657, LDL 80  Hypertension with Hypertensive urgency  - Off NTG gtt 1/6, on amlodipine 5mg  and losartan 50mg   Encephalopathy:  - Relative hypotension and hypoxemia yesterday leading to lethargy/confusion, improved off NTG gtt - Continues this AM, holding norvasc due to BPs 150s/80s while in the room today - Continues to retain CO2, worsening since admission 71.4 yesterday evening, 80 today => Start BiPAP and reassess.  - MRI head to evaluate for stroke/other causes of AMS in setting of hypertensive urgency.   COPD: Presumptive Dx with 50 pack-year hx, 40 year hx chronic cough. Bronchitic changes on CXR. CO2 retention on ABG.  - Respiratory acidosis with CO2 of 63 and pH of 7.30 with some compensation  versus evidence of chronic CO2 retention with bicarbonate of 31. Repeat ABG with worsening hypercarbia and hypoxemia. Starting BiPAP with repeat ABG in 2-3 hours.  - Avoiding beta-blocker - Duonebs prn wheezing  Hypernatremia, mild: Resolved 1/6  Anemia, mild, normocytic: hgb 11.0, continue to monitor.   FEN/GI: Heart healthy diet, Strict I/O, Saline Lock IV  Prophylaxis: Heparin   Disposition: Continue respiratory management in SDU  Subjective: Pt had episode of lethargy yesterday evening. Pt poorly arousable this AM. Oriented to person only, daughter at bedside reports confusion without agitation, and that she is less lethargic than yesterday evening. No respiratory distress reported.   Objective: Temp:  [98.1 F (36.7 C)-99.8 F (37.7 C)] 99.8 F (37.7 C) (01/07 0400) Pulse Rate:  [79-107] 85 (01/07 0700) Resp:  [20-41] 41 (01/07 0700) BP: (123-176)/(49-116) 173/83 mmHg (01/07 0700) SpO2:  [91 %-100 %] 93 % (01/07 0700) Weight:  [122 lb 9.2 oz (55.6 kg)] 122 lb 9.2 oz (55.6 kg) (01/07 0500) Physical Exam: Gen: elderly female in NAD sleeping soundly HEENT: NCAT, EOMI, PERRL, MMM  CV: RRR, no murmur  Resp: Diffuse expiratory wheeze, overall diminished breath sounds, mild increased work of breathing  Abd: SNTND, BS present, no guarding or organomegaly  Ext: 2+ edema, warm, no tenderness in bilateral legs.  Neuro: Poorly arousable, oriented x1. Follows some commands. Protecting airway  Laboratory:  Recent Labs Lab 07/19/13 1310 07/20/13 2204  WBC 4.6 7.1  HGB 11.8* 11.0*  HCT 36.5 34.7*  PLT 108* 96*    Recent Labs Lab 07/20/13 0730 07/20/13 2204 07/21/13 0213  NA 146  147 147  K 3.4* 3.8 3.7  CL 99 97 98  CO2 39* 44* 42*  BUN 8 7 7   CREATININE 0.81 0.93 0.84  CALCIUM 8.2* 8.4 8.2*  GLUCOSE 115* 111* 102*    07/20/2013 07:30  Cholesterol 132  Triglycerides 62  HDL 40  LDL (calc) 80  VLDL 12  Total CHOL/HDL Ratio 3.3    07/19/2013 16:1013:10  Pro B Natriuretic  peptide (BNP) 11566.0 (H)    07/19/2013 13:21  Color, Urine YELLOW  APPearance CLEAR  Specific Gravity, Urine 1.016  pH 5.5  Glucose NEGATIVE  Bilirubin Urine NEGATIVE  Ketones, ur NEGATIVE  Protein 100 (A)  Urobilinogen, UA 1.0  Nitrite NEGATIVE  Leukocytes, UA NEGATIVE  Hgb urine dipstick SMALL (A)  WBC, UA 0-2  RBC / HPF 0-2  Squamous Epithelial / LPF RARE  Bacteria, UA RARE  Casts HYALINE CASTS (A)   ECHO 1/6 - Left ventricle: The cavity size was normal. Wall thickness was increased in a pattern of moderate LVH. The estimated ejection fraction was 55%. Regional wall motion abnormalities cannot be excluded. - Mitral valve: Mild regurgitation. - Left atrium: The atrium was mildly dilated. - Pulmonary arteries: PA peak pressure: 44mm Hg (S). - Pericardium, extracardiac: A small to moderate pericardial effusion was identified posterior to the heart. There was no evidence of hemodynamic compromise.  Imaging/Diagnostic Tests: CXR 1VW FINDINGS:  Enlargement of cardiac silhouette with pulmonary vascular  congestion.  Mediastinal contours normal.  Bibasilar pleural effusions and atelectasis.  Minimal accentuation of perihilar markings the question bronchitis.  No definite acute pulmonary edema or segmental consolidation.  No pneumothorax.  Bones demineralized.  IMPRESSION:  Enlargement of cardiac silhouette with pulmonary vascular  congestion.  Bronchitic changes with bibasilar effusions and probable mild  bibasilar atelectasis.  [name], MD 07/21/2013, 7:18 AM PGY-1, [facility] FPTS Intern pager: [phone], text pages welcome

## 2013-07-21 NOTE — Progress Notes (Signed)
FMTS Attending  Note: Angelica Stanczyk,MD I  have seen and examined this patient, reviewed their chart. I have discussed this patient with the resident. I agree with the resident's findings, assessment and care plan.  Patient denies any concern today however she was not so communicative, as per her daughter who was by her bedside, this is new for her, patient moves her head back and forth intermittently and seem a little drowsy. Could be developing change in mental status,?? Anoxic encephalopathy vs delirium. Plan to obtain MRI to r/o acute stroke in the setting of hypertensive emergency. Her BP has improved a lot since yesterday.Continue current BP regimen as listed and will monitor BP over the next 24 hrs.

## 2013-07-21 NOTE — Consult Note (Signed)
Name: Camera Krienke MRN: [id number] DOB: 11-13-1940    ADMISSION DATE:  07/19/2013 CONSULTATION DATE:  07/21/13  REFERRING MD :  Lum Babe PRIMARY SERVICE: FMTS  CHIEF COMPLAINT:  Dyspnea  BRIEF PATIENT DESCRIPTION: Angelica Mcbride is a 73 year old female minimal contact with healthcare system who presented with acute CHF, hypertensive urgency,   Chronic hypercapnia, and subsequently developed encephalopathy during hospital stay.  SIGNIFICANT EVENTS / STUDIES:  07/20/13 TTE >>>> LVEF 55% 07/21/13 CT head >>>>  LINES / TUBES: 07/19/13 LUE PIV  CULTURES: None  ANTIBIOTICS: None  HISTORY OF PRESENT ILLNESS:  Angelica Mcbride is a 73 year old female who presented to [facility] on 07/19/13 with progressive dyspnea, orthopnea, and lower extremity edema.  She had not seen a physician in 20 years.  She was diagnosed with acute CHF, hypertensive urgency, and likely COPD.  She was treated with IV lasix and diuresed ~20 lbs since admission. On hospital day 2 she was noted to have increased lethargy confusion and on day 3 was noted to have 2 runs of asymptomatic Vtach.  She was placed on BIPAP for worsening hypercapnia. A head CT is pending.  PAST MEDICAL HISTORY :  History reviewed. No pertinent past medical history. History reviewed. No pertinent past surgical history. Prior to Admission medications   Medication Sig Start Date End Date Taking? Authorizing Provider  acetaminophen (TYLENOL) 500 MG tablet Take 500 mg by mouth every 8 (eight) hours as needed for mild pain.   Yes Historical Provider, MD  EPINEPHrine Base (PRIMATENE MIST IN) Inhale 1-2 puffs into the lungs every 8 (eight) hours as needed (for congestion).   Yes Historical Provider, MD  guaiFENesin (MUCINEX) 600 MG 12 hr tablet Take 600 mg by mouth 2 (two) times daily as needed for to loosen phlegm.   Yes Historical Provider, MD   No Known Allergies  FAMILY HISTORY:  Family History  Problem Relation Age of Onset  . Diabetes Daughter    SOCIAL  HISTORY:  reports that she has been smoking Cigarettes.  She has a 51 pack-year smoking history. She does not have any smokeless tobacco history on file. She reports that she does not drink alcohol. Her drug history is not on file.  REVIEW OF SYSTEMS:  Unable to obtain secondary to mental status.  SUBJECTIVE: Patient non-verbal at this time.  VITAL SIGNS: Temp:  [97.7 F (36.5 C)-100 F (37.8 C)] 97.7 F (36.5 C) (01/07 1707) Pulse Rate:  [60-107] 98 (01/07 1707) Resp:  [14-41] 22 (01/07 1707) BP: (123-202)/(49-116) 167/102 mmHg (01/07 1707) SpO2:  [91 %-100 %] 100 % (01/07 1707) Weight:  [122 lb 9.2 oz (55.6 kg)] 122 lb 9.2 oz (55.6 kg) (01/07 0500) HEMODYNAMICS:   VENTILATOR SETTINGS:   INTAKE / OUTPUT: Intake/Output     01/06 0701 - 01/07 0700 01/07 0701 - 01/08 0700   I.V. (mL/kg) 728.3 (13.1)    Total Intake(mL/kg) 728.3 (13.1)    Urine (mL/kg/hr) 200 (0.1) 400 (0.6)   Total Output 200 400   Net +528.3 -400        Urine Occurrence 4 x      PHYSICAL EXAMINATION: General:  Laying in bed, on BIPAP, moves head from side to side to signify "no" to all questions Neuro:  Follows command to squeeze fingers, will not open eyes. HEENT:  [place]/AT Cardiovascular:  RRR, no murmur Lungs:  CTA no wheezing Abdomen:  Soft, normoactive bowel sounds, nontender Musculoskeletal:  No pedal edema Skin:  Warm, dry  LABS:  CBC  Recent Labs Lab 07/19/13 1310 07/20/13 2204  WBC 4.6 7.1  HGB 11.8* 11.0*  HCT 36.5 34.7*  PLT 108* 96*   BMET  Recent Labs Lab 07/20/13 0730 07/20/13 2204 07/21/13 0213  NA 146 147 147  K 3.4* 3.8 3.7  CL 99 97 98  CO2 39* 44* 42*  BUN 8 7 7   CREATININE 0.81 0.93 0.84  GLUCOSE 115* 111* 102*   Electrolytes  Recent Labs Lab 07/20/13 0730 07/20/13 2204 07/21/13 0213  CALCIUM 8.2* 8.4 8.2*   Sepsis Markers  Recent Labs Lab 07/20/13 2204  LATICACIDVEN 0.9   ABG  Recent Labs Lab 07/20/13 2104 07/21/13 1219 07/21/13 1709  PHART  7.412 7.340* 7.349*  PCO2ART 71.4* 86.3* 85.4*  PO2ART 49.0* 126.0* 79.0*   Cardiac Enzymes  Recent Labs Lab 07/19/13 1310 07/20/13 0020 07/20/13 0730  TROPONINI <0.30 <0.30 <0.30  PROBNP 11566.0*  --   --    Glucose  Recent Labs Lab 07/20/13 1754 07/20/13 2104  GLUCAP 124* 120*    Imaging CXR: 07/19/13 PORTABLE CHEST - 1 VIEW  COMPARISON: Portable exam 1703 hr without priors for comparison  FINDINGS:  Enlargement of cardiac silhouette with pulmonary vascular  congestion.  Mediastinal contours normal.  Bibasilar pleural effusions and atelectasis.  Minimal accentuation of perihilar markings the question bronchitis.  No definite acute pulmonary edema or segmental consolidation.  No pneumothorax.  Bones demineralized.  IMPRESSION:  Enlargement of cardiac silhouette with pulmonary vascular  congestion.  Bronchitic changes with bibasilar effusions and probable mild  bibasilar atelectasis.   ASSESSMENT / PLAN:  1. Acute resp failure, mostly all chronic COPD ABG, edema improved on pcxr but may have exac by afterload, at risk PE, -Stop BiPAP and evaluate rr and distress, may not have required much of NIMV clinically , abg does not require this, likley co2 at baseline 80 -Avoid super oxygenation -may require spiral CT chest -pcxr in am   2. AMS - Multiple possible etiology including hypertensive enceph, underperfusion with lower MAP from NTg in chronic HTn - poor autoreguation at lower MAP, r/o acute cva, TIA  Vs poor autoregulation -Lactic acid  -need STAT ct head, not done as of yet will expedite -MAP goal 25% reduction from prior highest MAP = 100 -dc NTG, which will also can increase ICP -if BP rises, and need control add int hydral, if drip needed- nicardipine  3. V Tach - Patient without symptoms during 15-17 beat run [place] today. Likely all hypomag, hypok, r/o ischemia -aggressive mag, k supp, trop, ecg 4. CHF - appears euvolemic at this time. Minimal leg  swelling.    GlobaL; autoregulation vs cva / tia, needs STAT ct head, seems improved to some extent with BP allowed to rise, see MAP goals, may need neuro to see, aggressive mag, k  supp I have personally obtained a history, examined the patient, evaluated laboratory and imaging results, formulated the assessment and plan and placed orders. CRITICAL CARE: The patient is critically ill with multiple organ systems failure and requires high complexity decision making for assessment and support, frequent evaluation and titration of therapies, application of advanced monitoring technologies and extensive interpretation of multiple databases. Critical Care Time devoted to patient care services described in this note is 35 minutes.   Mcarthur Rossettianiel J. Tyson AliasFeinstein, MD, FACP Pgr: [phone] [facility]  Carlynn PurlErik Hoffman, DO PGY-1 Internal Medicine Pager [phone] 07/21/2013, 6:59 PM

## 2013-07-21 NOTE — Clinical Documentation Improvement (Signed)
THIS DOCUMENT IS NOT A PERMANENT PART OF THE MEDICAL RECORD  Please update your documentation with the medical record to reflect your response to this query. If you need help knowing how to do this please call [phone].  07/21/13   Dear Doctor,  Patient with Acute/chronic CHF and COPD exacerbation found to be in "mild to moderate respiratory distress". Please clarify the acuity to better illustrate this patient's severity of illness and risk of mortality. Thank you.  Possible Clinical Conditions? - Acute respiratory distress - Acute respiratory failure  - Acute on chronic respiratory failure  - Other Condition (please specify)   Supporting Information: - Respiratory acidosis - 1/5 CO2 42, 1/6 CO2 40 - O2 2L [place] - increased lethargy, confusion, somnolence  You may use possible, probable, or suspect with inpatient documentation. possible, probable, suspected diagnoses MUST be documented at the time of discharge  Reviewed: additional documentation in the medical record Please see addendum to progress note 07/22/13  Thank You, Alycia Rossettiyan B. Jarvis NewcomerGrunz, MD, PGY-1 07/22/2013 4:48 PM

## 2013-07-22 ENCOUNTER — Inpatient Hospital Stay (HOSPITAL_COMMUNITY): Payer: Medicare Other

## 2013-07-22 DIAGNOSIS — I619 Nontraumatic intracerebral hemorrhage, unspecified: Secondary | ICD-10-CM | POA: Diagnosis not present

## 2013-07-22 DIAGNOSIS — I509 Heart failure, unspecified: Secondary | ICD-10-CM | POA: Diagnosis not present

## 2013-07-22 DIAGNOSIS — J9600 Acute respiratory failure, unspecified whether with hypoxia or hypercapnia: Secondary | ICD-10-CM | POA: Diagnosis not present

## 2013-07-22 DIAGNOSIS — I10 Essential (primary) hypertension: Secondary | ICD-10-CM | POA: Diagnosis not present

## 2013-07-22 DIAGNOSIS — I6350 Cerebral infarction due to unspecified occlusion or stenosis of unspecified cerebral artery: Secondary | ICD-10-CM | POA: Diagnosis not present

## 2013-07-22 DIAGNOSIS — I5031 Acute diastolic (congestive) heart failure: Secondary | ICD-10-CM | POA: Diagnosis not present

## 2013-07-22 DIAGNOSIS — G9340 Encephalopathy, unspecified: Secondary | ICD-10-CM | POA: Diagnosis not present

## 2013-07-22 LAB — BASIC METABOLIC PANEL
BUN: 10 mg/dL (ref 6–23)
CO2: 39 mEq/L — ABNORMAL HIGH (ref 19–32)
CREATININE: 0.8 mg/dL (ref 0.50–1.10)
Calcium: 8.2 mg/dL — ABNORMAL LOW (ref 8.4–10.5)
Chloride: 95 mEq/L — ABNORMAL LOW (ref 96–112)
GFR calc Af Amer: 84 mL/min — ABNORMAL LOW (ref >90)
GFR, EST NON AFRICAN AMERICAN: 72 mL/min — AB (ref >90)
Glucose, Bld: 95 mg/dL (ref 70–99)
Potassium: 4.2 mEq/L (ref 3.7–5.3)
SODIUM: 146 meq/L (ref 137–147)

## 2013-07-22 LAB — MAGNESIUM: Magnesium: 3.1 mg/dL — ABNORMAL HIGH (ref 1.5–2.5)

## 2013-07-22 LAB — LACTIC ACID, PLASMA: Lactic Acid, Venous: 0.9 mmol/L (ref 0.5–2.2)

## 2013-07-22 LAB — TROPONIN I: Troponin I: <0.3 ng/mL (ref <0.30)

## 2013-07-22 MED ORDER — HYDRALAZINE HCL 20 MG/ML IJ SOLN: 10.0000 mg | INTRAMUSCULAR | Status: DC | PRN

## 2013-07-22 MED ORDER — HYDRALAZINE HCL 20 MG/ML IJ SOLN
10.0000 mg | INTRAMUSCULAR | Status: DC | PRN
  Administered 2013-07-22 – 2013-07-25 (×7): 10 mg via INTRAVENOUS
  Filled 2013-07-22 (×6): qty 1

## 2013-07-22 MED ORDER — HYDRALAZINE HCL 20 MG/ML IJ SOLN
10.0000 mg | INTRAMUSCULAR | Status: DC | PRN
  Administered 2013-07-22: 10 mg via INTRAVENOUS
  Filled 2013-07-22: qty 1

## 2013-07-22 NOTE — Consult Note (Signed)
NEURO HOSPITALIST CONSULT NOTE    Reason for Consult: possible pontine bleed seen on CT head  HPI:                                                                                                                                          Majority of history obtained from the chart.   Angelica Mcbride is an 73 y.o. female presenting to hospital with acute congestive heart failure, hypernatremia, hypertensive urgency requiring Nitro drip. Patient was admitted for her worsening COPD.  On hospital day 2 she was noted to have worsening lethargy and confusion.  Patient was placed on BIPAP for worsening hypercapnia most recent pCO2 of 86 and 85. CT head was obtained showing a 4 mm high-density focus in the lower left pons which could represent hemorrhagic infarct.   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    History reviewed. No pertinent past medical history.  History reviewed. No pertinent past surgical history.  Family History  Problem Relation Age of Onset  . Diabetes Daughter      Social History:  reports that she has been smoking Cigarettes.  She has a 51 pack-year smoking history. She does not have any smokeless tobacco history on file. She reports that she does not drink alcohol. Her drug history is not on file.  No Known Allergies  MEDICATIONS:                                                                                                                     Scheduled: . furosemide  40 mg Intravenous Daily  . influenza vac split quadrivalent PF  0.5 mL Intramuscular Tomorrow-1000  . losartan  50 mg Oral Daily  . pneumococcal 23 valent vaccine  0.5 mL Intramuscular Tomorrow-1000  . sodium chloride  3 mL Intravenous Q12H  . sodium chloride  3 mL Intravenous Q12H   Continuous:  QJJ:HERDEY chloride, acetaminophen, acetaminophen, hydrALAZINE, ipratropium-albuterol, sodium chloride   ROS:  History obtained from unobtainable from patient due to mental status    Blood pressure 191/87, pulse 77, temperature 98.4 F (36.9 C), temperature source Oral, resp. rate 20, height 5' 7"  (1.702 m), weight 54.2 kg (119 lb 7.8 oz), SpO2 100.00%.   Neurologic Examination:                                                                                                       Mental Status: Alert, moaning, opens eyes to commands and tracks my movements in room.  Will not follow verbal commands, but will answer "no" when asked if she is in pain, count my fingers in all fields.  Cranial Nerves: II: Discs flat bilaterally; Visual fields grossly normal, pupils equal, round, reactive to light and accommodation III,IV, VI: ptosis not present, extra-ocular motions intact bilaterally V,VII: smile asymmetric on the left when wincing in pain, facial light touch sensation intact to noxious stimuli VIII: hearing normal bilaterally IX,X: gag reflex present XI: bilateral shoulder shrug XII: midline tongue extension without atrophy or fasciculations  Motor: Right : Upper extremity   4/5    Left:     Upper extremity   4/5  Lower extremity   4/5     Lower extremity   4/5 --follows minimal commands but withdraws to pain briskly and with good strength.  Tone and bulk:normal tone throughout; no atrophy noted Sensory: withdrawals from pain in all 4 extremities.  Deep Tendon Reflexes:  Right: Upper Extremity   Left: Upper extremity   biceps (C-5 to C-6) 2/4   biceps (C-5 to C-6) 2/4 tricep (C7) 2/4    triceps (C7) 2/4 Brachioradialis (C6) 2/4  Brachioradialis (C6) 2/4  Lower Extremity Lower Extremity  quadriceps (L-2 to L-4) 2/4   quadriceps (L-2 to L-4) 2/4 Achilles (S1) 1/4   Achilles (S1) 1/4  Plantars: Right: downgoing   Left: downgoing Cerebellar: Unable to assess due to patient not following commands.  Gait:  not able to obtain CV: pulses palpable throughout   Lab Results  Component Value Date/Time   CHOL 132 07/20/2013  7:30 AM    Results for orders placed during the hospital encounter of 07/19/13 (from the past 48 hour(s))  GLUCOSE, CAPILLARY     Status: Abnormal   Collection Time    07/20/13  5:54 PM      Result Value Range   Glucose-Capillary 124 (*) 70 - 99 mg/dL  POCT I-STAT 3, BLOOD GAS (G3+)     Status: Abnormal   Collection Time    07/20/13  9:04 PM      Result Value Range   pH, Arterial 7.412  7.350 - 7.450   pCO2 arterial 71.4 (*) 35.0 - 45.0 mmHg   pO2, Arterial 49.0 (*) 80.0 - 100.0 mmHg   Bicarbonate 45.3 (*) 20.0 - 24.0 mEq/L   TCO2 47  0 - 100 mmol/L   O2 Saturation 81.0     Acid-Base Excess 17.0 (*) 0.0 - 2.0 mmol/L   Patient temperature 99.3 F     Collection site RADIAL, ALLEN'S TEST ACCEPTABLE  Drawn by RT     Sample type ARTERIAL     Comment NOTIFIED PHYSICIAN    GLUCOSE, CAPILLARY     Status: Abnormal   Collection Time    07/20/13  9:04 PM      Result Value Range   Glucose-Capillary 120 (*) 70 - 99 mg/dL  LACTIC ACID, PLASMA     Status: None   Collection Time    07/20/13 10:04 PM      Result Value Range   Lactic Acid, Venous 0.9  0.5 - 2.2 mmol/L  BASIC METABOLIC PANEL     Status: Abnormal   Collection Time    07/20/13 10:04 PM      Result Value Range   Sodium 147  137 - 147 mEq/L   Potassium 3.8  3.7 - 5.3 mEq/L   Chloride 97  96 - 112 mEq/L   CO2 44 (*) 19 - 32 mEq/L   Comment: CRITICAL RESULT CALLED TO, READ BACK BY AND VERIFIED WITH:     HORTON,A RN 07/20/2013 2301 JORDANS     REPEATED TO VERIFY   Glucose, Bld 111 (*) 70 - 99 mg/dL   BUN 7  6 - 23 mg/dL   Creatinine, Ser 0.93  0.50 - 1.10 mg/dL   Calcium 8.4  8.4 - 10.5 mg/dL   GFR calc non Af Amer 60 (*) >90 mL/min   GFR calc Af Amer 70 (*) >90 mL/min   Comment: (NOTE)     The eGFR has been calculated using the CKD EPI equation.     This calculation has not been validated in all  clinical situations.     eGFR's persistently <90 mL/min signify possible Chronic Kidney     Disease.  CBC     Status: Abnormal   Collection Time    07/20/13 10:04 PM      Result Value Range   WBC 7.1  4.0 - 10.5 K/uL   RBC 4.30  3.87 - 5.11 MIL/uL   Hemoglobin 11.0 (*) 12.0 - 15.0 g/dL   HCT 34.7 (*) 36.0 - 46.0 %   MCV 80.7  78.0 - 100.0 fL   MCH 25.6 (*) 26.0 - 34.0 pg   MCHC 31.7  30.0 - 36.0 g/dL   RDW 17.1 (*) 11.5 - 15.5 %   Platelets 96 (*) 150 - 400 K/uL   Comment: CONSISTENT WITH PREVIOUS RESULT  BASIC METABOLIC PANEL     Status: Abnormal   Collection Time    07/21/13  2:13 AM      Result Value Range   Sodium 147  137 - 147 mEq/L   Potassium 3.7  3.7 - 5.3 mEq/L   Chloride 98  96 - 112 mEq/L   CO2 42 (*) 19 - 32 mEq/L   Comment: CRITICAL RESULT CALLED TO, READ BACK BY AND VERIFIED WITH:     HORTON,A RN 07/21/2013 0408 JORDANS     REPEATED TO VERIFY   Glucose, Bld 102 (*) 70 - 99 mg/dL   BUN 7  6 - 23 mg/dL   Creatinine, Ser 0.84  0.50 - 1.10 mg/dL   Calcium 8.2 (*) 8.4 - 10.5 mg/dL   GFR calc non Af Amer 68 (*) >90 mL/min   GFR calc Af Amer 79 (*) >90 mL/min   Comment: (NOTE)     The eGFR has been calculated using the CKD EPI equation.     This calculation has not been validated in all clinical situations.  eGFR's persistently <90 mL/min signify possible Chronic Kidney     Disease.  POCT I-STAT 3, BLOOD GAS (G3+)     Status: Abnormal   Collection Time    07/21/13 12:19 PM      Result Value Range   pH, Arterial 7.340 (*) 7.350 - 7.450   pCO2 arterial 86.3 (*) 35.0 - 45.0 mmHg   pO2, Arterial 126.0 (*) 80.0 - 100.0 mmHg   Bicarbonate 46.5 (*) 20.0 - 24.0 mEq/L   TCO2 49  0 - 100 mmol/L   O2 Saturation 98.0     Acid-Base Excess 16.0 (*) 0.0 - 2.0 mmol/L   Collection site RADIAL, ALLEN'S TEST ACCEPTABLE     Drawn by Operator     Sample type ARTERIAL     Comment NOTIFIED PHYSICIAN    BASIC METABOLIC PANEL     Status: Abnormal   Collection Time     07/21/13  5:05 PM      Result Value Range   Sodium 147  137 - 147 mEq/L   Potassium 3.6 (*) 3.7 - 5.3 mEq/L   Chloride 94 (*) 96 - 112 mEq/L   CO2 35 (*) 19 - 32 mEq/L   Glucose, Bld 88  70 - 99 mg/dL   BUN 9  6 - 23 mg/dL   Creatinine, Ser 0.80  0.50 - 1.10 mg/dL   Calcium 8.3 (*) 8.4 - 10.5 mg/dL   GFR calc non Af Amer 72 (*) >90 mL/min   GFR calc Af Amer 84 (*) >90 mL/min   Comment: (NOTE)     The eGFR has been calculated using the CKD EPI equation.     This calculation has not been validated in all clinical situations.     eGFR's persistently <90 mL/min signify possible Chronic Kidney     Disease.  MAGNESIUM     Status: Abnormal   Collection Time    07/21/13  5:05 PM      Result Value Range   Magnesium 1.3 (*) 1.5 - 2.5 mg/dL  POCT I-STAT 3, BLOOD GAS (G3+)     Status: Abnormal   Collection Time    07/21/13  5:09 PM      Result Value Range   pH, Arterial 7.349 (*) 7.350 - 7.450   pCO2 arterial 85.4 (*) 35.0 - 45.0 mmHg   pO2, Arterial 79.0 (*) 80.0 - 100.0 mmHg   Bicarbonate 47.1 (*) 20.0 - 24.0 mEq/L   TCO2 50  0 - 100 mmol/L   O2 Saturation 94.0     Acid-Base Excess 17.0 (*) 0.0 - 2.0 mmol/L   Collection site RADIAL, ALLEN'S TEST ACCEPTABLE     Drawn by Operator     Sample type ARTERIAL     Comment NOTIFIED PHYSICIAN    TROPONIN I     Status: None   Collection Time    07/21/13  7:04 PM      Result Value Range   Troponin I <0.30  <0.30 ng/mL   Comment:            Due to the release kinetics of cTnI,     a negative result within the first hours     of the onset of symptoms does not rule out     myocardial infarction with certainty.     If myocardial infarction is still suspected,     repeat the test at appropriate intervals.  LACTIC ACID, PLASMA     Status: None   Collection Time  07/21/13 11:15 PM      Result Value Range   Lactic Acid, Venous 0.9  0.5 - 2.2 mmol/L  BASIC METABOLIC PANEL     Status: Abnormal   Collection Time    07/22/13  4:52 AM       Result Value Range   Sodium 146  137 - 147 mEq/L   Potassium 4.2  3.7 - 5.3 mEq/L   Chloride 95 (*) 96 - 112 mEq/L   CO2 39 (*) 19 - 32 mEq/L   Glucose, Bld 95  70 - 99 mg/dL   BUN 10  6 - 23 mg/dL   Creatinine, Ser 0.80  0.50 - 1.10 mg/dL   Calcium 8.2 (*) 8.4 - 10.5 mg/dL   GFR calc non Af Amer 72 (*) >90 mL/min   GFR calc Af Amer 84 (*) >90 mL/min   Comment: (NOTE)     The eGFR has been calculated using the CKD EPI equation.     This calculation has not been validated in all clinical situations.     eGFR's persistently <90 mL/min signify possible Chronic Kidney     Disease.  TROPONIN I     Status: None   Collection Time    07/22/13  4:52 AM      Result Value Range   Troponin I <0.30  <0.30 ng/mL   Comment:            Due to the release kinetics of cTnI,     a negative result within the first hours     of the onset of symptoms does not rule out     myocardial infarction with certainty.     If myocardial infarction is still suspected,     repeat the test at appropriate intervals.  MAGNESIUM     Status: Abnormal   Collection Time    07/22/13  4:52 AM      Result Value Range   Magnesium 3.1 (*) 1.5 - 2.5 mg/dL    Ct Head Wo Contrast  07/22/2013   CLINICAL DATA:  Mental status changes.  Encephalopathy.  EXAM: CT HEAD WITHOUT CONTRAST  TECHNIQUE: Contiguous axial images were obtained from the base of the skull through the vertex without intravenous contrast.  COMPARISON:  None.  FINDINGS: Skull and Sinuses:No significant abnormality.  Orbits: No acute abnormality.  Brain: There is a 4 mm high-density focus in the lower left pons. There may be minimal surrounding low-attenuation.  No evidence of acute large vessel territory infarct, hydrocephalus, mass lesion, or shift. There have been perforator infarcts in the bilateral basal ganglia, the larger in the right lenticulostriate distribution affecting the caudate and anterior internal capsule. Bilateral putaminal lacunar infarcts. Right  thalamic lacunar infarct. Diffuse small vessel ischemic white matter injury.  Critical Value/emergent results were called by telephone at the time of interpretation on 07/22/2013 at 2:25 AM to Dr. Earnest Conroy", who verbally acknowledged these results.  IMPRESSION: 1. 4 mm presumed hemorrhage in the lower left pons. 2. Extensive small-vessel ischemic injury, with remote appearing bilateral perforator infarcts.   Electronically Signed   By: Jorje Guild M.D.   On: 07/22/2013 02:29   Dg Chest Port 1 View  07/21/2013   CLINICAL DATA:  Altered mental status.  Hypercarbia.  EXAM: PORTABLE CHEST - 1 VIEW  COMPARISON:  07/19/2013  FINDINGS: There is chronic cardiomegaly. Pulmonary vascularity is now within normal limits. Decrease in the small right effusion. Small left effusion is unchanged. No acute osseous abnormality.  IMPRESSION:  Improving congestive heart failure.   Electronically Signed   By: Rozetta Nunnery M.D.   On: 07/21/2013 20:24    Assessment and plan per attending neurologist  Etta Quill PA-C [facility] [phone]  07/22/2013, 1:14 PM   Assessment/Plan: 73 YO female with confusion and encephalopathy in the setting of COPD exacerbation and hypercapnia. CT obtained shows a 4 mm hyperdensity region in left lower pons concerning for bleed. If bleed ,likely hypertensive given location and  Admission BP's in 190-200's. Exam shows left lower facial weakness but no other lateralizing abnormalities.   Recommend: 1) MRI brain to confirm pontine abnormality is a hemorrhagic infarct.  2) Goal systolic BP <383 3) Hold anitplatelet therapy for now 4) Stroke risk assessment if MRI confirms pontine stroke (carotid Doppler study, echocardiogram, hemoglobin A1c, fasting lipid panel, MRA brain without contrast) 5) PT and OT consults  I personally participate in this patient's evaluation and management, including formulating the above clinical impression and management recommendations.   Rush Farmer  M.D. [facility] [phone]

## 2013-07-22 NOTE — Progress Notes (Signed)
FMTS Attending  Note: Ladonya Jerkins,MD I  have seen and examined this patient, reviewed their chart. I have discussed this patient with the resident. I agree with the resident's findings, assessment and care plan.  

## 2013-07-22 NOTE — Progress Notes (Signed)
INITIAL NUTRITION ASSESSMENT  DOCUMENTATION CODES Per approved criteria  -Not Applicable   INTERVENTION: 1.  Modify diet; resume PO diet once medically appropriate per MD discretion.  NUTRITION DIAGNOSIS: Inadequate oral intake related to inability to eat as evidenced by NPO.   Monitor:  1.  Food/Beverage; pt meeting >/=90% estimated needs with tolerance. 2.  Wt/wt change; monitor trends  Reason for Assessment: Low Braden  73 y.o. female  Admitting Dx: dyspnea  ASSESSMENT: Pt admitted with dyspnea, acute CHF, and subsequently developed AMS.  Pt underwent CT which showed possible pontine stroke.  She has left facial weakness. RD met with pt who is not oriented at time of visit.  She is able to answer questions and states she is hungry. She is currently NPO due to possible stroke.   Nutrition Focused Physical Exam: Subcutaneous Fat:  Orbital Region: WNL Upper Arm Region: WNL Thoracic and Lumbar Region: WNL  Muscle:  Temple Region: WNL Clavicle Bone Region: mild-moderate wasting Clavicle and Acromion Bone Region: WNL Scapular Bone Region: mild wasting Dorsal Hand: WNL Patellar Region: WNL Anterior Thigh Region: WNL Posterior Calf Region: WNL  Edema: none present  Height: Ht Readings from Last 1 Encounters:  07/19/13 5' 7"  (1.702 m)    Weight: Wt Readings from Last 1 Encounters:  07/22/13 119 lb 7.8 oz (54.2 kg)    Ideal Body Weight: 135 lbs  % Ideal Body Weight: 88%  Wt Readings from Last 10 Encounters:  07/22/13 119 lb 7.8 oz (54.2 kg)    Usual Body Weight: unknown  % Usual Body Weight: unable to assess  BMI:  Body mass index is 18.71 kg/(m^2).  Estimated Nutritional Needs: Kcal: 1520-1630 Protein: 65-76g Fluid: >1.5 L/day  Skin: no issues noted  Diet Order: NPO  EDUCATION NEEDS: -No education needs identified at this time   Intake/Output Summary (Last 24 hours) at 07/22/13 1432 Last data filed at 07/22/13 0800  Gross per 24 hour   Intake    390 ml  Output    425 ml  Net    -35 ml    Last BM: 1/4  Labs:   Recent Labs Lab 07/21/13 0213 07/21/13 1705 07/22/13 0452  NA 147 147 146  K 3.7 3.6* 4.2  CL 98 94* 95*  CO2 42* 35* 39*  BUN 7 9 10   CREATININE 0.84 0.80 0.80  CALCIUM 8.2* 8.3* 8.2*  MG  --  1.3* 3.1*  GLUCOSE 102* 88 95    CBG (last 3)   Recent Labs  07/20/13 1754 07/20/13 2104  GLUCAP 124* 120*    Scheduled Meds: . furosemide  40 mg Intravenous Daily  . influenza vac split quadrivalent PF  0.5 mL Intramuscular Tomorrow-1000  . losartan  50 mg Oral Daily  . pneumococcal 23 valent vaccine  0.5 mL Intramuscular Tomorrow-1000  . sodium chloride  3 mL Intravenous Q12H  . sodium chloride  3 mL Intravenous Q12H    Continuous Infusions:   History reviewed. No pertinent past medical history.  History reviewed. No pertinent past surgical history.  Brynda Greathouse, MS RD LDN Clinical Inpatient Dietitian Pager: [phone] Weekend/After hours pager: [phone]

## 2013-07-22 NOTE — Progress Notes (Signed)
Sung AmabileSimonds, MD paged regarding pt mental and respiratory status. MRI canceled due to pt instability. STAT CT of head ordered. Will continue to monitor.

## 2013-07-22 NOTE — Progress Notes (Signed)
eLink Physician-Brief Progress Note Patient Name: Faye RamsayJoan Bagsby DOB: 08-23-1940 MRN: [id number]  Date of Service  07/22/2013   HPI/Events of Note   CT head >>> 4 mm presumed hemorrhage in the lower left pons   eICU Interventions   Heparin / ASA d/c'd SCDs ordered    Intervention Category Intermediate Interventions: Communication with other healthcare providers and/or family  Lonia FarberZUBELEVITSKIY, Anne-Marie Genson 07/22/2013, 2:39 AM

## 2013-07-22 NOTE — Progress Notes (Signed)
Tyson AliasFeinstein, MD at bedside. Pt oriented to place, disoriented to time, place, and situation. MD aware. Pt taken off of BiPaP and placed on 6L [place]. Tolerating at this time. Will continue to monitor.

## 2013-07-22 NOTE — Progress Notes (Addendum)
Family Medicine Teaching Service Daily Progress Note Intern Pager: [phone]  Patient name: Angelica Mcbride Medical record number: [id number] Date of birth: 11-08-1940 Age: 73 y.o. Gender: female  Primary Care Provider: No PCP Per Patient Consultants: CCM, neurology Code Status: Full  Pt Overview and Major Events to Date:  1/5: Admitted for acute CHF, lasix 40mg  IV TID 1/6: Off NTG gtt, losartan and norvasc started 1/7: Down nearly 20 lbs from admission. Decrease lasix to 40mg  IV daily  Assessment and Plan: Angelica RamsayJoan Ennen is a 73 y.o. year old female presenting with acute congestive heart failure with the background of likely COPD and HTN.   Acute CHF: History of worsening dyspnea in setting of no medical care. BNP: 11566.0, CXR with pulmonary congestion and cardiac enlargement. - 2D ECHO: EF 55%, likely diastolic dysfunction with moderate LVH, pulmonary HTN.  - ECG 1/6 unchanged from 1/5: LAD, LVH, LAFB, and nonspecific flipped T waves in lateral leads  - Slow diuresis with Lasix 40mg  IV daily, Cr stable 0.84  - Strict I/Os: Poorly charted quantity due to incontinence, assume more negative balance than charted given weight loss of ~20lbs. 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 Allow permissive HTN given CVA.  - Off NTG gtt 1/6, amlodipine 5mg  and losartan 50mg  d/c'ed 1/8  Hemorrhagic infarct: CT head shows presumed 4mm left lower pons hemorrhage 1/8  - Stop all BP meds to allow permissive HTN in setting of chronic HTN - Anticoagulants held - MRI head today - Neurology consulted  Acute respiratory failure: Presumptive COPD dx with 50 pack-year hx, 40 year hx chronic cough. Bronchitic changes on CXR. Chronic, compensated respiratory acidosis and hypoxia on ABG. - Respiratory status stable requiring 6L O2 by [place] after trial of BiPAP  - Avoiding beta-blocker - Duonebs prn  wheezing  Paroxysmal ventricular tachycardia Noted 2 runs 1/7, given Mag. No symptoms - ECG 1/7 showed sinus rhythm with PVCs, LAD, LAFB, TWI in lateral leads; ECG 1/8 stable - Troponins neg x2  Hypomagnesemia Noted 1/7 - 1.9 > 3.1 s/p repletion  Hypernatremia, mild: Resolved 1/6  Anemia, mild, normocytic: hgb 11.0, continue to monitor.   FEN/GI: Heart healthy diet, Strict I/O, Saline Lock IV  Prophylaxis: SCDs, anti-coagulants stopped for brain bleed  Disposition: Continue respiratory management, stroke work up in [facility]  Subjective: Pt poorly arousable, oriented to person only, daughter not at bedside.   Objective: Temp:  [97.7 F (36.5 C)-99 F (37.2 C)] 99 F (37.2 C) (01/08 0400) Pulse Rate:  [60-102] 92 (01/08 0800) Resp:  [13-32] 22 (01/08 0800) BP: (127-202)/(68-110) 191/90 mmHg (01/08 0800) SpO2:  [95 %-100 %] 100 % (01/08 0800) FiO2 (%):  [100 %] 100 % (01/07 1900) Weight:  [119 lb 7.8 oz (54.2 kg)] 119 lb 7.8 oz (54.2 kg) (01/08 0500) Physical Exam: Gen: elderly female in NAD sleeping soundly HEENT: NCAT, EOMI, PERRL, MMM  CV: RRR, no murmur  Resp: Diffuse expiratory wheeze, overall diminished breath sounds, normal work of breathing  Abd: SNTND, BS present, no guarding or organomegaly  Ext: 2+ edema, warm, no tenderness in bilateral legs.  Neuro: Poorly arousable, oriented x1. Follows some commands. Protecting airway  Laboratory:  Recent Labs Lab 07/19/13 1310 07/20/13 2204  WBC 4.6 7.1  HGB 11.8* 11.0*  HCT 36.5 34.7*  PLT 108* 96*    Recent Labs Lab 07/21/13 0213 07/21/13 1705 07/22/13 0452  NA 147 147 146  K 3.7  3.6* 4.2  CL 98 94* 95*  CO2 42* 35* 39*  BUN 7 9 10   CREATININE 0.84 0.80 0.80  CALCIUM 8.2* 8.3* 8.2*  GLUCOSE 102* 88 95    07/20/2013 07:30  Cholesterol 132  Triglycerides 62  HDL 40  LDL (calc) 80  VLDL 12  Total CHOL/HDL Ratio 3.3    07/19/2013 16:10  Pro B Natriuretic peptide (BNP) 11566.0 (H)    07/19/2013 13:21   Color, Urine YELLOW  APPearance CLEAR  Specific Gravity, Urine 1.016  pH 5.5  Glucose NEGATIVE  Bilirubin Urine NEGATIVE  Ketones, ur NEGATIVE  Protein 100 (A)  Urobilinogen, UA 1.0  Nitrite NEGATIVE  Leukocytes, UA NEGATIVE  Hgb urine dipstick SMALL (A)  WBC, UA 0-2  RBC / HPF 0-2  Squamous Epithelial / LPF RARE  Bacteria, UA RARE  Casts HYALINE CASTS (A)   ECHO 1/6 - Left ventricle: The cavity size was normal. Wall thickness was increased in a pattern of moderate LVH. The estimated ejection fraction was 55%. Regional wall motion abnormalities cannot be excluded. - Mitral valve: Mild regurgitation. - Left atrium: The atrium was mildly dilated. - Pulmonary arteries: PA peak pressure: 44mm Hg (S). - Pericardium, extracardiac: A small to moderate pericardial effusion was identified posterior to the heart. There was no evidence of hemodynamic compromise.  Imaging/Diagnostic Tests: CXR 1VW FINDINGS:  Enlargement of cardiac silhouette with pulmonary vascular  congestion.  Mediastinal contours normal.  Bibasilar pleural effusions and atelectasis.  Minimal accentuation of perihilar markings the question bronchitis.  No definite acute pulmonary edema or segmental consolidation.  No pneumothorax.  Bones demineralized.  IMPRESSION:  Enlargement of cardiac silhouette with pulmonary vascular  congestion.  Bronchitic changes with bibasilar effusions and probable mild  bibasilar atelectasis.  CT HEAD WITHOUT CONTRAST  FINDINGS:  Skull and Sinuses:No significant abnormality.  Orbits: No acute abnormality.  Brain: There is a 4 mm high-density focus in the lower left pons.  There may be minimal surrounding low-attenuation.  No evidence of acute large vessel territory infarct, hydrocephalus,  mass lesion, or shift. There have been perforator infarcts in the  bilateral basal ganglia, the larger in the right lenticulostriate  distribution affecting the caudate and anterior  internal capsule.  Bilateral putaminal lacunar infarcts. Right thalamic lacunar  infarct. Diffuse small vessel ischemic white matter injury.  Critical Value/emergent results were called by telephone at the time  of interpretation on 07/22/2013 at 2:25 AM to Dr. Herma Carson", who verbally  acknowledged these results.  IMPRESSION:  1. 4 mm presumed hemorrhage in the lower left pons.  2. Extensive small-vessel ischemic injury, with remote appearing  bilateral perforator infarcts.  Hazeline Junker, MD 07/22/2013, 8:29 AM PGY-1, [facility] Family Medicine FPTS Intern pager: [phone], text pages welcome

## 2013-07-23 ENCOUNTER — Inpatient Hospital Stay (HOSPITAL_COMMUNITY): Payer: Medicare Other

## 2013-07-23 DIAGNOSIS — I509 Heart failure, unspecified: Secondary | ICD-10-CM | POA: Diagnosis not present

## 2013-07-23 DIAGNOSIS — J438 Other emphysema: Secondary | ICD-10-CM | POA: Diagnosis not present

## 2013-07-23 DIAGNOSIS — J90 Pleural effusion, not elsewhere classified: Secondary | ICD-10-CM | POA: Diagnosis not present

## 2013-07-23 DIAGNOSIS — E872 Acidosis: Secondary | ICD-10-CM | POA: Diagnosis not present

## 2013-07-23 DIAGNOSIS — I10 Essential (primary) hypertension: Secondary | ICD-10-CM | POA: Diagnosis not present

## 2013-07-23 DIAGNOSIS — I613 Nontraumatic intracerebral hemorrhage in brain stem: Secondary | ICD-10-CM | POA: Diagnosis present

## 2013-07-23 DIAGNOSIS — I619 Nontraumatic intracerebral hemorrhage, unspecified: Secondary | ICD-10-CM

## 2013-07-23 MED ORDER — LOSARTAN POTASSIUM 50 MG PO TABS
100.0000 mg | ORAL_TABLET | Freq: Every day | ORAL | Status: DC
  Filled 2013-07-23 (×2): qty 2

## 2013-07-23 MED ORDER — AMLODIPINE BESYLATE 5 MG PO TABS
5.0000 mg | ORAL_TABLET | Freq: Every day | ORAL | Status: DC
  Filled 2013-07-23 (×3): qty 1

## 2013-07-23 MED ORDER — IOHEXOL 350 MG/ML SOLN
80.0000 mL | Freq: Once | INTRAVENOUS | Status: AC | PRN
  Administered 2013-07-23: 80 mL via INTRAVENOUS

## 2013-07-23 MED ORDER — LORAZEPAM 2 MG/ML IJ SOLN: 1.0000 mg | Freq: Once | INTRAMUSCULAR | Status: DC

## 2013-07-23 NOTE — Discharge Summary (Signed)
Family Medicine Teaching [facility] Discharge Summary  Patient name: Angelica Mcbride Medical record number: [id number] Date of birth: September 10, 1940 Age: 73 y.o. Gender: female Date of Admission: 07/19/2013  Date of Discharge: 07/29/2013 Admitting Physician: Nestor Ramp, MD  Primary Care Provider: No PCP Per Patient Consultants: Neurology, critical care medicine, PM&R  Indication for Hospitalization: Acute respiratory failure  Discharge Diagnoses/Problem List:  Patient Active Problem List   Diagnosis Date Noted  . 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 Acute congestive heart failure 07/19/2013  . Hypertensive urgency 07/19/2013  . Respiratory acidosis 07/19/2013  . HTN (hypertension) 07/19/2013   Disposition: Discharged to [facility] inpatient rehabilitation  Discharge Condition: Stable  Discharge Exam:  Gen: Thin, elderly female in NAD.  HEENT: NCAT, EOMI, MMM, supple neck with full range of motion.  CV: RRR, no murmur  Resp: Non-labored, CTAB, no wheezing  Abd: SNTND, BS present, no guarding or organomegaly  Ext: no edema, warm, no tenderness in bilateral legs.  Neuro: A&O x3. Speech normal, gait steady  Brief Hospital Course:  Angelica Mcbride is a 73 y.o. 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   A nitroglycerin drip was started for severe HTN with good response, though the patient became progressively more encephalopathic with lowered blood pressures, so permissive HTN was allowed. High-dose lasix was administered with resultant 20lbs. diuresis. ABG showed marked hypercapnia without acidosis (pCO2 80), and chest x-ray showed bronchitic changes consistent with COPD due to a 50-pack-year history of smoking. Cardiac enzymes were negative and CTA was negative for PE. 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 Neurology was consulted and CVA workup included carotid dopplers without evidence of ICA stenosis, echocardiogram showing preserved EF (55%), likely LVH and likely diastolic dysfunction, and no wall motion abnormalities. Repeat head imaging showed partial resolution of pontine hemorrhage. Neurology consultant opined that the hemorrhage would not explain the encephalopathy and that no further work up was necessary. She was started on aspirin 81mg  for secondary stroke prevention.   On day 8 of admission the patient was found to be more interactive, likely due to continued resolution of hemorrhage and re-equilibration to lower blood pressures. Oral losartan 50mg  and norvasc 5mg  were started on 1/13. She will be discharged to [facility] 1/15.   Iatrogenic hypernatremia was treated by discontinuing lasix and modifying IV fluids with resolution on 1/12.  Paroxysmal ventricular tachycardia was noted on 1/7 and was resolved after administration of magnesium.  Mild normocytic anemia and thrombocytopenia were monitored throughout the admission and no transfusion was necessary.   Issues for Follow Up:  - Follow up BP control and volume status on norvasc 5mg , losartan 50mg , and lasix 40mg . Dry weight approximately 120-125lbs.  - Follow up physical therapy progress.   - 4.5 mm nodule over the right middle lobe found on CT angiogram 07/23/13. Recommend follow-up noncontrast chest CT in 6 months.   Significant Procedures: None  Significant Labs and Imaging:   Recent Labs Lab 07/26/13 0700 07/27/13 0302  WBC 11.9* 8.7  HGB 13.7 12.5  HCT 42.7 37.4  PLT 128* 143*    Recent Labs Lab 07/25/13 1830 07/26/13 0700  07/27/13 0302 07/27/13 1327 07/28/13 0245 07/29/13 0515 07/29/13 0957  NA 155* 151*  < > 139 142 139 139 138  K 3.6* 4.1  < >  3.6* 3.5* 3.4* 4.2 3.8  CL 98 99  < > 92* 95* 96 105 98  CO2 37* 36*  < > 36* 37* 33* 17* 30  GLUCOSE 95 87  < > 123* 146* 112*  81 133*  BUN 36* 34*  < > 26* 24* 20 41* 13  CREATININE 1.01 0.93  < > 0.83 1.02 0.89 2.92* 0.81  CALCIUM 8.7 8.6  < > 8.5 8.3* 8.2* 8.7 8.3*  ALKPHOS  --  47  --   --   --   --   --   --   AST  --  33  --   --   --   --   --   --   ALT  --  16  --   --   --   --   --   --   ALBUMIN  --  2.7*  --   --   --   --   --   --   < > = values in this interval not displayed.  Lab  07/20/13 2104  07/21/13 1219  07/21/13 1709  07/24/13 1110   PHART  7.412  7.340*  7.349*  7.481*   PCO2ART  71.4*  86.3*  85.4*  60.5*   PO2ART  49.0*  126.0*  79.0*  80.0   HCO3  45.3*  46.5*  47.1*  45.1*   TCO2  47  49  50  47   O2SAT  81.0  98.0  94.0  96.0     07/20/13 0020  07/20/13 0730   HGBA1C  6.3*  --   TRIG  --  62   CHOL  --  132   HDL  --  40   LDLCALC  --  80   TSH  0.657  --    Lab  07/21/13 1904  07/22/13 0452   TROPONINI  <0.30  <0.30    1/5 U/A - 100 protein, neg UTI  1/5 1V CXR - Vascular Congestion, + Bronchitis  1/7 1V CXR - Improving congestion  1/8 CT Head w/o contrast - 4mm L pons hemorrhage; extensive small-vessel ischemic changes with remote infarcts  1/8 MRI Head w/o contrast - 5mm L pons hemorrhage; 3mm L pons acute infarct; remote B basal ganglia, thalamic, cerebellar infarcts; moderate microvascular ischemia  1/9 - CTA Chest - neg for PE, + nodule, mild emphysema; small B pleural effusions; small pericardial effusion  ECHO 1/6: + LVH, EF 55% poorly visualized wall motion. PA peak pressure . Small pericardial effusion  Carotid doppler study: 1-39% B; some tortuosity with ? 40-50% at L mid ICA  Results/Tests Pending at Time of Discharge: None  Discharge Medications:    Medication List         acetaminophen 500 MG tablet  Commonly known as:  TYLENOL  Take 500 mg by mouth every 8 (eight) hours as needed for mild pain.     amLODipine 5 MG tablet  Commonly known as:  NORVASC  Take 1 tablet (5 mg total) by mouth daily.     aspirin 81 MG chewable tablet   Chew 1 tablet (81 mg total) by mouth daily.     atorvastatin 40 MG tablet  Commonly known as:  LIPITOR  Take 1 tablet (40 mg total) by mouth daily at 6 PM.     furosemide 40 MG tablet  Commonly known as:  LASIX  Take 1 tablet (40 mg total) by mouth daily.  guaiFENesin 600 MG 12 hr tablet  Commonly known as:  MUCINEX  Take 600 mg by mouth 2 (two) times daily as needed for to loosen phlegm.     losartan 50 MG tablet  Commonly known as:  COZAAR  Take 1 tablet (50 mg total) by mouth daily.     PRIMATENE MIST IN  Inhale 1-2 puffs into the lungs every 8 (eight) hours as needed (for congestion).       Discharge Instructions: Please refer to Patient Instructions section of EMR for full details.  Patient was counseled important signs and symptoms that should prompt return to medical care, changes in medications, dietary instructions, activity restrictions, and follow up appointments.   Follow-Up Appointments: Follow-up Information   Follow up with Kevin FentonBradshaw, Samuel, MD On 08/02/2013. (3:00pm)    Specialty:  Family Medicine   Contact information:   [address] [place] [address] [phone]       Hazeline Junkeryan Grunz, MD 08/01/2013, 10:02 AM PGY-1, [facility]

## 2013-07-23 NOTE — Progress Notes (Signed)
Stroke Team Progress Note  HISTORY Angelica Mcbride is an 73 y.o. female presenting to hospital 07/19/2013 with acute congestive heart failure, hypernatremia, hypertensive urgency requiring Nitro drip and worsening COPD. On hospital day 2 she was noted to have worsening lethargy and confusion. Patient was placed on BIPAP for worsening hypercapnia most recent pCO2 of 86 and 85. CT head was obtained showing a 4 mm high-density focus in the lower left pons which could represent hemorrhagic infarct. 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 History reviewed. Pt with no pertinent past medical history.  SUBJECTIVE Her daughter is at the bedside.  Overall she feels her condition is gradually improving. She has no prior h/o stroke and has not been to a Md in years and was on no medicines.  OBJECTIVE Most recent Vital Signs: Filed Vitals:   07/23/13 0500 07/23/13 0600 07/23/13 0700 07/23/13 0728  BP:  177/66 164/80 163/82  Pulse: 111 84 101 108  Temp:    98.6 F (37 C)  TempSrc:    Oral  Resp: 18 28 17 12   Height:      Weight: 51.9 kg (114 lb 6.7 oz)     SpO2: 95% 96% 96% 91%   CBG (last 3)   Recent Labs  07/20/13 1754 07/20/13 2104  GLUCAP 124* 120*    IV Fluid Intake:     MEDICATIONS  . furosemide  40 mg Intravenous Daily  . influenza vac split quadrivalent PF  0.5 mL Intramuscular Tomorrow-1000  . losartan  50 mg Oral Daily  . pneumococcal 23 valent vaccine  0.5 mL Intramuscular Tomorrow-1000  . sodium chloride  3 mL Intravenous Q12H  . sodium chloride  3 mL Intravenous Q12H   PRN:  sodium chloride, acetaminophen, acetaminophen, hydrALAZINE, ipratropium-albuterol, sodium chloride  Diet:  NPO  Activity:  Up as tolerated DVT Prophylaxis:  supraclavicular d  CLINICALLY SIGNIFICANT STUDIES Basic Metabolic Panel:  Recent Labs Lab 07/21/13 1705 07/22/13 0452  NA 147 146  K 3.6* 4.2  CL 94* 95*  CO2 35* 39*  GLUCOSE 88 95  BUN 9 10  CREATININE  0.80 0.80  CALCIUM 8.3* 8.2*  MG 1.3* 3.1*   Liver Function Tests: No results found for this basename: AST, ALT, ALKPHOS, BILITOT, PROT, ALBUMIN,  in the last 168 hours CBC:  Recent Labs Lab 07/19/13 1310 07/20/13 2204  WBC 4.6 7.1  NEUTROABS 2.9  --   HGB 11.8* 11.0*  HCT 36.5 34.7*  MCV 79.3 80.7  PLT 108* 96*   Coagulation: No results found for this basename: LABPROT, INR,  in the last 168 hours Cardiac Enzymes:  Recent Labs Lab 07/20/13 0730 07/21/13 1904 07/22/13 0452  TROPONINI <0.30 <0.30 <0.30   Urinalysis:  Recent Labs Lab 07/19/13 1321  COLORURINE YELLOW  LABSPEC 1.016  PHURINE 5.5  GLUCOSEU NEGATIVE  HGBUR SMALL*  BILIRUBINUR NEGATIVE  KETONESUR NEGATIVE  PROTEINUR 100*  UROBILINOGEN 1.0  NITRITE NEGATIVE  LEUKOCYTESUR NEGATIVE   Lipid Panel    Component Value Date/Time   CHOL 132 07/20/2013 0730   TRIG 62 07/20/2013 0730   HDL 40 07/20/2013 0730   CHOLHDL 3.3 07/20/2013 0730   VLDL 12 07/20/2013 0730   LDLCALC 80 07/20/2013 0730   HgbA1C  Lab Results  Component Value Date   HGBA1C 6.3* 07/20/2013    Urine Drug Screen:   No results found for this basename: labopia, cocainscrnur, labbenz, amphetmu, thcu, labbarb    Alcohol Level: No results found for this basename:  ETH,  in the last 168 hours   CT of the brain  07/22/2013    1. 4 mm presumed hemorrhage in the lower left pons. 2. Extensive small-vessel ischemic injury, with remote appearing bilateral perforator infarcts.     MRI of the brain  07/22/2013   1. 5 mm focus of hemorrhage in the left pons. 2. Possible 3 mm focus of acute infarct in the left pons. 3. Remote bilateral basal ganglia, thalamic, and cerebellar infarcts as above. Moderate chronic microvascular ischemia.  MRA of the brain  Not done  2D Echocardiogram  07/20/13 Normal EF  Carotid Doppler    CXR  07/21/2013    Improving congestive heart failure.   EKG  normal sinus rhythm.   Therapy Recommendations  pending  Physical Exam   Frail  elderly african american lady not in distress. t. Afebrile. Head is nontraumatic. Neck is supple without bruit. Hearing is normal. Cardiac exam no murmur or gallop. Lungs are clear to auscultation. Distal pulses are well felt. Neurological Exam ;  Awake  Alert oriented x 1.diminished attention, registration and recall. Follows 2 step commands well. Normal speech and language except mild tangentiality and confusion.Marland Kitchen.eye movements full without nystagmus.fundi were not visualized. Vision acuity and fields appear normal. Hearing is normal. Palatal movements are normal. Face symmetric. Tongue midline. Normal strength, tone, reflexes and coordination. Normal sensation. Gait deferred. ASSESSMENT  Angelica Mcbride is a 73 y.o. female who developed worsening lethary and confusion in hospital. Imaging confirms a tiny Left pontine microhemorrhage versus old calcified infarct unlikely to explain present clinical presentation. 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  On no antithrombotics prior to admission. Now on no antithrombotics secondary to hemorrhage for secondary stroke prevention.  Altered mental status likely from hypertensive emergency and CO 2 narcosis from COPd exacerbation and not from tiny Pontine microhemorrhage  Acute CHF  Malignant hypertension   Acute respiratory failure  Paroxysmal ventricular tachycardia  LDL 80  HgbA1c 6.3  Hospital day # 4  TREATMENT/PLAN  Continue to hold antipletelets for secondary stroke prevention.Repeat Ct head in am and if hemorrhage versus calcification stable stary aspirin 81 mg daily for stroke prevention as she has had multiple lacunar infarcts  Strict control of HT with BP goal below 130/90. Check TCD  D/W daughter and family Practice resident  Annie MainSHARON BIBY, MSN, RN, ANVP-BC, ANP-BC,  Lawernce IonGNP-BC [facility] Pager: [phone] 07/23/2013 8:19 AM  I have personally obtained a history, examined the patient, evaluated imaging results, and formulated the assessment and plan of care. I agree with the above. Delia HeadyPramod Sethi, MD

## 2013-07-23 NOTE — Progress Notes (Signed)
Stroke Team Progress Note  HISTORY Majority of history obtained from the chart.  Angelica Mcbride is an 73 y.o. female presenting to hospital with acute congestive heart failure, hypernatremia, hypertensive urgency requiring Nitro drip. Patient was admitted for her worsening COPD. On hospital day 2 she was noted to have worsening lethargy and confusion. Patient was placed on BIPAP for worsening hypercapnia most recent pCO2 of 86 and 85. CT head was obtained showing a 4 mm high-density focus in the lower left pons which could represent hemorrhagic infarct. 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      Patient was not a TPA candidate secondary to  Possible hemorrhage on CT. She was admitted to the  ICU  for further evaluation and treatment.  SUBJECTIVE Her  RN and daughter is at the bedside.  Overall she feels her condition is gradually improving. She is more alert and interactive  OBJECTIVE Most recent Vital Signs: Filed Vitals:   07/23/13 0800 07/23/13 0900 07/23/13 1000 07/23/13 1200  BP: 156/73 146/62 155/66 175/67  Pulse: 105 92 98   Temp:    98.3 F (36.8 C)  TempSrc:    Oral  Resp: 16 31 16    Height:      Weight:      SpO2: 96% 96% 97% 99%   CBG (last 3)   Recent Labs  07/20/13 1754 07/20/13 2104  GLUCAP 124* 120*    IV Fluid Intake:     MEDICATIONS  . amLODipine  5 mg Oral Daily  . furosemide  40 mg Intravenous Daily  . influenza vac split quadrivalent PF  0.5 mL Intramuscular Tomorrow-1000  . LORazepam  1 mg Intravenous Once  . [START ON 07/24/2013] losartan  100 mg Oral Daily  . pneumococcal 23 valent vaccine  0.5 mL Intramuscular Tomorrow-1000  . sodium chloride  3 mL Intravenous Q12H  . sodium chloride  3 mL Intravenous Q12H   PRN:  sodium chloride, acetaminophen, acetaminophen, hydrALAZINE, ipratropium-albuterol, sodium chloride  Diet:  NPO  s Activity:  Bedrest  DVT Prophylaxis:  SCDs CLINICALLY SIGNIFICANT STUDIES Basic  Metabolic Panel:  Recent Labs Lab 07/21/13 1705 07/22/13 0452  NA 147 146  K 3.6* 4.2  CL 94* 95*  CO2 35* 39*  GLUCOSE 88 95  BUN 9 10  CREATININE 0.80 0.80  CALCIUM 8.3* 8.2*  MG 1.3* 3.1*   Liver Function Tests: No results found for this basename: AST, ALT, ALKPHOS, BILITOT, PROT, ALBUMIN,  in the last 168 hours CBC:  Recent Labs Lab 07/19/13 1310 07/20/13 2204  WBC 4.6 7.1  NEUTROABS 2.9  --   HGB 11.8* 11.0*  HCT 36.5 34.7*  MCV 79.3 80.7  PLT 108* 96*   Coagulation: No results found for this basename: LABPROT, INR,  in the last 168 hours Cardiac Enzymes:  Recent Labs Lab 07/20/13 0730 07/21/13 1904 07/22/13 0452  TROPONINI <0.30 <0.30 <0.30   Urinalysis:  Recent Labs Lab 07/19/13 1321  COLORURINE YELLOW  LABSPEC 1.016  PHURINE 5.5  GLUCOSEU NEGATIVE  HGBUR SMALL*  BILIRUBINUR NEGATIVE  KETONESUR NEGATIVE  PROTEINUR 100*  UROBILINOGEN 1.0  NITRITE NEGATIVE  LEUKOCYTESUR NEGATIVE   Lipid Panel    Component Value Date/Time   CHOL 132 07/20/2013 0730   TRIG 62 07/20/2013 0730   HDL 40 07/20/2013 0730   CHOLHDL 3.3 07/20/2013 0730   VLDL 12 07/20/2013 0730   LDLCALC 80 07/20/2013 0730   HgbA1C  Lab Results  Component Value Date  HGBA1C 6.3* 07/20/2013    Urine Drug Screen:   No results found for this basename: labopia, cocainscrnur, labbenz, amphetmu, thcu, labbarb    Alcohol Level: No results found for this basename: ETH,  in the last 168 hours  Ct Head Wo Contrast  07/22/2013   CLINICAL DATA:  Mental status changes.  Encephalopathy.  EXAM: CT HEAD WITHOUT CONTRAST  TECHNIQUE: Contiguous axial images were obtained from the base of the skull through the vertex without intravenous contrast.  COMPARISON:  None.  FINDINGS: Skull and Sinuses:No significant abnormality.  Orbits: No acute abnormality.  Brain: There is a 4 mm high-density focus in the lower left pons. There may be minimal surrounding low-attenuation.  No evidence of acute large vessel territory  infarct, hydrocephalus, mass lesion, or shift. There have been perforator infarcts in the bilateral basal ganglia, the larger in the right lenticulostriate distribution affecting the caudate and anterior internal capsule. Bilateral putaminal lacunar infarcts. Right thalamic lacunar infarct. Diffuse small vessel ischemic white matter injury.  Critical Value/emergent results were called by telephone at the time of interpretation on 07/22/2013 at 2:25 AM to Dr. Herma Carson", who verbally acknowledged these results.  IMPRESSION: 1. 4 mm presumed hemorrhage in the lower left pons. 2. Extensive small-vessel ischemic injury, with remote appearing bilateral perforator infarcts.   Electronically Signed   By: Tiburcio Pea M.D.   On: 07/22/2013 02:29   Ct Angio Chest Pe W/cm &/or Wo Cm  07/23/2013   CLINICAL DATA:  Congestive heart failure. Worsening COPD. Rule out pulmonary embolism.  EXAM: CT ANGIOGRAPHY CHEST WITH CONTRAST  TECHNIQUE: Multidetector CT imaging of the chest was performed using the standard protocol during bolus administration of intravenous contrast. Multiplanar CT image reconstructions including MIPs were obtained to evaluate the vascular anatomy.  CONTRAST:  80mL OMNIPAQUE IOHEXOL 350 MG/ML SOLN  COMPARISON:  Chest x-ray 07/21/2013  FINDINGS: Lungs are adequately inflated with mild emphysematous disease. There are small bilateral pleural effusions left worse than right with associated compressive atelectasis in the left base. There is a 4.5 mm nodule over the right middle lobe. There is mild cardiomegaly. There is a small pericardial effusion. The pulmonary arterial system is within normal without evidence of emboli. There is no significant hilar, mediastinal or axillary adenopathy. There is very minimal calcification over the left anterior descending coronary artery.  Limited images through the upper abdomen demonstrate a sub cm hypodensity over the dome of the liver likely a small cyst or hemangioma. Remainder  of the exam is unremarkable.  Review of the MIP images confirms the above findings.  IMPRESSION: No evidence of pulmonary embolism.  Small pericardial effusion with mild cardiomegaly.  Mild emphysematous disease. Small bilateral pleural effusions left greater than right with associated compressive atelectasis in the left base.  4.5 mm nodule over the right middle lobe. Recommend follow-up noncontrast chest CT in 6 months. This recommendation follows the consensus statement: Guidelines for Management of Small Pulmonary Nodules Detected on CT Scans: A Statement from the Fleischner Society as published in Radiology 2005; 237:395-400. Online at: DietDisorder.cz.  Sub cm hypodensity over the dome of the liver likely a cyst or hemangioma.   Electronically Signed   By: Elberta Fortis M.D.   On: 07/23/2013 14:03   Mr Brain Wo Contrast  07/22/2013   CLINICAL DATA:  Altered mental status. Presumed pontine hemorrhage on CT.  EXAM: MRI HEAD WITHOUT CONTRAST  TECHNIQUE: Multiplanar, multiecho pulse sequences of the brain and surrounding structures were obtained without intravenous contrast.  COMPARISON:  Head CT 07/22/2013  FINDINGS: Images are moderately degraded by motion artifact.  There is question of a 3 mm focus of restricted diffusion in the left pons (series 8, image 10). As seen on recent CT, there is a 5 mm small focus of hemorrhage in the left pons. Remote pontine infarcts are noted. Remote infarcts are also noted involving the left cerebellum, bilateral basal ganglia and thalami, and left centrum semiovale. Periventricular T2 hyperintensities are compatible with moderate chronic small vessel ischemic disease. There is moderate cerebral atrophy. Two punctate, remote microhemorrhages are questioned in the right occipital lobe, with with an additional one questioned adjacent to the body of the left lateral ventricle. Major intracranial vascular flow voids are unremarkable. Orbits  are normal. Visualized paranasal sinuses and mastoid air cells are clear.  IMPRESSION: 1. 5 mm focus of hemorrhage in the left pons. 2. Possible 3 mm focus of acute infarct in the left pons. 3. Remote bilateral basal ganglia, thalamic, and cerebellar infarcts as above. Moderate chronic microvascular ischemia.   Electronically Signed   By: Sebastian AcheAllen  Grady   On: 07/22/2013 18:53   Dg Chest Port 1 View  07/21/2013   CLINICAL DATA:  Altered mental status.  Hypercarbia.  EXAM: PORTABLE CHEST - 1 VIEW  COMPARISON:  07/19/2013  FINDINGS: There is chronic cardiomegaly. Pulmonary vascularity is now within normal limits. Decrease in the small right effusion. Small left effusion is unchanged. No acute osseous abnormality.  IMPRESSION: Improving congestive heart failure.   Electronically Signed   By: Geanie CooleyJim  Maxwell M.D.   On: 07/21/2013 20:24      2D Echocardiogram  Normal EF  Carotid Doppler  40-59 % left mid ICA stenosis  CXR    EKG  Normal sinus rhythm Biatrial enlargement Left anterior fascicular block Left ventricular hypertrophy T wave abnormality, consider lateral ischemia unchanged from prior tracing Prolonged QT Therapy Recommendations pending Physical Exam   Frail elderly african american lady not in distress.on nasal oxygen mask. . Afebrile. Head is nontraumatic. Neck is supple without bruit.  . Cardiac exam no murmur or gallop. Lungs are clear to auscultation. Distal pulses are well felt. Neurological Exam : Awake and alert at present. Follows simple midline and one-step commands only. Distractible. Diminished attention, registration and recall. Disoriented to time place and person. Speaks only short sentences and few words. Extraocular moments are full range without nystagmus. Pupils irregular but reactive. Blinks to threat bilaterally. No facial weakness. Able to move all 4 extremities equally well against gravity without any focal weakness. Deep tendon reflexes are symmetric. Plantars are both  downgoing. Gait was not tested. No flaps or tremors are noted. ASSESSMENT Ms. Angelica Mcbride is a 73 y.o. female  who developed lethargy and confusion 2 days after admission for congestive heart failure, hyper cardia and hypertensive urgency. She has a nonfocal exam. CT scan of the head showed a 4 mm left pontine hyperdensity an MRI scan shows multiple remote age lacunar infarcts as well as one tiny 2 mm left pontine diffusion-positive lesions as well as a small 4 mm left pontine hemorrhage versus calcification. The patient's clinical presentation and exam findings are not compatible with acute pontine hemorrhage or infarct and these imaging findings are likely incidental and related to hypertensive microvascular disease and hypertensive urgency. Have primary neurological presentation is likely related to encephalopathy from CO2 narcosis and hypertensive urgency. On no prior to admission. Now on  no for secondary stroke prevention. Patient with resultant improving mental status and confusion  Work up underway.      Hospital day # 4  TREATMENT/PLAN  Continue ongoing medical management for her encephalopathy and strict blood pressure control. Repeat CT scan of the head in a.m. and may consider starting aspirin 81 mg if CT scan hyperdensity is decreased or stable. Mobilize patient out of bed with physical occupational therapy consults. No further stroke workup is necessary at this time.  Annie Main, MSN, RN, ANVP-BC, ANP-BC, [facility] Pager: [phone] 07/23/2013 2:54 PM  I have personally obtained a history, examined the patient, evaluated imaging results, and formulated the assessment and plan of care. I agree with the above. Delia Heady, MD

## 2013-07-23 NOTE — Progress Notes (Signed)
SLP Cancellation Note  Patient Details Name: Faye RamsayJoan Schinke MRN: [id number] DOB: 1941/04/12   Cancelled treatment:       Reason Eval/Treat Not Completed: Patient not sufficiently alert to participate in swallow assessment.  Will return next date.   Blenda MountsCouture, Carlita Whitcomb Laurice 07/23/2013, 2:42 PM

## 2013-07-23 NOTE — Progress Notes (Signed)
Family medicine intern paged regarding pt continued HTN despite use of PRN hydralazine dosing and inability to administer PO meds d/t pt lethargy, transient ability to follow commands, concern for aspiration.

## 2013-07-23 NOTE — Progress Notes (Signed)
Family Medicine Teaching Service Daily Progress Note Intern Pager: [phone]  Patient name: Angelica Mcbride Medical record number: [id number] Date of birth: [birth year] Age: 73 y.o. Gender: female  Primary Care Provider: No PCP Per Patient Consultants: CCM, neurology Code Status: Full  Pt Overview and Major Events to Date:  1/5: Admitted for acute CHF, lasix 40mg  IV TID 1/6: Off NTG gtt, losartan and norvasc started 1/7: Down nearly 20 lbs from admission. Decrease lasix to 40mg  IV daily 1/8: MRI shows pontine hemmorhage with infarct and multiple bilateral likely hypertensive infarcts  Assessment and Plan: Angelica Mcbride is a 73 y.o. year old female presenting with acute congestive heart failure with the background of likely COPD and HTN.   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  - Goal BP < 160mmHg - Anticoagulants held - Neurology consult appreciated - Carotid doppler study: 1-39% stenosis bilaterally without substantial plaque formation (preliminary read only) - Proceed with MRA brain without contrast - Hb A1c 6.3, TSH 0.657, LDL 80 - Consult SLP, PT, OT; consider palliative care consult  Malignant Hypertension: Likely cause of pontine hemorrhage. - Off NTG gtt 1/6, amlodipine 5mg  and losartan 50mg  d/c'ed 1/8  Acute CHF: History of worsening dyspnea in setting of no medical care. BNP: 11566.0, CXR with pulmonary congestion and cardiac enlargement. - 2D ECHO: EF 55%, likely diastolic dysfunction with moderate LVH, pulmonary HTN.  - ECG 1/6 unchanged from 1/5: LAD, LVH, LAFB, and nonspecific flipped T waves in lateral leads  - Lasix 40mg  IV daily, Cr stable with weight loss of ~20lbs. since admission  Acute respiratory failure: Presumptive COPD dx with 50 pack-year hx, 40 year hx chronic cough. Bronchitic changes on CXR. Chronic, compensated respiratory  acidosis and hypoxia on ABG. - O2 prn - Avoiding beta-blocker - Duonebs prn wheezing  Paroxysmal ventricular tachycardia Noted 2 runs 1/7, given Mag. No symptoms. - ECG 1/7 showed sinus rhythm with PVCs, LAD, LAFB, TWI in lateral leads; ECG 1/8 stable - Troponins neg x2  Hypomagnesemia Resolved 1/8 s/p repletion  Hypernatremia, mild: Resolved 1/6  Anemia, mild, normocytic: hgb 11.0, continue to monitor.   FEN/GI: Heart healthy diet, Strict I/O, Saline Lock IV  Prophylaxis: SCDs, anti-coagulants stopped for brain bleed  Disposition: Continue respiratory management, stroke work up in SDU  Subjective: Pt poorly arousable, makes only moaning sounds in response to voice commands. Withdraws to nailbed pressure.   Objective: Temp:  [97.4 F (36.3 C)-98.7 F (37.1 C)] 98.6 F (37 C) (01/09 0728) Pulse Rate:  [66-111] 108 (01/09 0728) Resp:  [12-36] 12 (01/09 0728) BP: (149-194)/(61-148) 163/82 mmHg (01/09 0728) SpO2:  [91 %-100 %] 91 % (01/09 0728) Weight:  [114 lb 6.7 oz (51.9 kg)] 114 lb 6.7 oz (51.9 kg) (01/09 0500) Physical Exam: Gen: elderly lethargic female in NAD  HEENT: NCAT, EOMI, PERRL, MMM  CV: RRR, no murmur  Resp: Overall diminished breath sounds, normal work of breathing, no wheezes noted  Abd: SNTND, BS present, no guarding or organomegaly  Ext: 2+ edema, warm, no tenderness in bilateral legs.  Neuro: Poorly arousable, oriented x1. Follows some commands. Protecting airway  Laboratory:  Recent Labs Lab 07/19/13 1310 07/20/13 2204  WBC 4.6 7.1  HGB 11.8* 11.0*  HCT 36.5 34.7*  PLT 108* 96*    Recent Labs Lab 07/21/13 0213 07/21/13 1705 07/22/13 0452  NA 147 147 146  K 3.7 3.6* 4.2  CL 98 94* 95*  CO2 42* 35* 39*  BUN 7 9 10   CREATININE 0.84 0.80 0.80  CALCIUM 8.2* 8.3* 8.2*  GLUCOSE 102* 88 95    07/20/2013 07:30  Cholesterol 132  Triglycerides 62  HDL 40  LDL (calc) 80  VLDL 12  Total CHOL/HDL Ratio 3.3    07/19/2013 02:72  Pro B  Natriuretic peptide (BNP) 11566.0 (H)    07/19/2013 13:21  Color, Urine YELLOW  APPearance CLEAR  Specific Gravity, Urine 1.016  pH 5.5  Glucose NEGATIVE  Bilirubin Urine NEGATIVE  Ketones, ur NEGATIVE  Protein 100 (A)  Urobilinogen, UA 1.0  Nitrite NEGATIVE  Leukocytes, UA NEGATIVE  Hgb urine dipstick SMALL (A)  WBC, UA 0-2  RBC / HPF 0-2  Squamous Epithelial / LPF RARE  Bacteria, UA RARE  Casts HYALINE CASTS (A)   ECHO 1/6 - Left ventricle: The cavity size was normal. Wall thickness was increased in a pattern of moderate LVH. The estimated ejection fraction was 55%. Regional wall motion abnormalities cannot be excluded. - Mitral valve: Mild regurgitation. - Left atrium: The atrium was mildly dilated. - Pulmonary arteries: PA peak pressure: 44mm Hg (S). - Pericardium, extracardiac: A small to moderate pericardial effusion was identified posterior to the heart. There was no evidence of hemodynamic compromise.  Imaging/Diagnostic Tests: CXR 1VW FINDINGS:  Enlargement of cardiac silhouette with pulmonary vascular  congestion.  Mediastinal contours normal.  Bibasilar pleural effusions and atelectasis.  Minimal accentuation of perihilar markings the question bronchitis.  No definite acute pulmonary edema or segmental consolidation.  No pneumothorax.  Bones demineralized.  IMPRESSION:  Enlargement of cardiac silhouette with pulmonary vascular  congestion.  Bronchitic changes with bibasilar effusions and probable mild  bibasilar atelectasis.  CT HEAD WITHOUT CONTRAST  FINDINGS:  Skull and Sinuses:No significant abnormality.  Orbits: No acute abnormality.  Brain: There is a 4 mm high-density focus in the lower left pons.  There may be minimal surrounding low-attenuation.  No evidence of acute large vessel territory infarct, hydrocephalus,  mass lesion, or shift. There have been perforator infarcts in the  bilateral basal ganglia, the larger in the right  lenticulostriate  distribution affecting the caudate and anterior internal capsule.  Bilateral putaminal lacunar infarcts. Right thalamic lacunar  infarct. Diffuse small vessel ischemic white matter injury.  Critical Value/emergent results were called by telephone at the time  of interpretation on 07/22/2013 at 2:25 AM to Dr. Herma Carson", who verbally  acknowledged these results.  IMPRESSION:  1. 4 mm presumed hemorrhage in the lower left pons.  2. Extensive small-vessel ischemic injury, with remote appearing  bilateral perforator infarcts.  MRI HEAD WITHOUT CONTRAST  FINDINGS:  Images are moderately degraded by motion artifact.  There is question of a 3 mm focus of restricted diffusion in the  left pons (series 8, image 10). As seen on recent CT, there is a 5  mm small focus of hemorrhage in the left pons. Remote pontine  infarcts are noted. Remote infarcts are also noted involving the  left cerebellum, bilateral basal ganglia and thalami, and left  centrum semiovale. Periventricular T2 hyperintensities are  compatible with moderate chronic small vessel ischemic disease.  There is moderate cerebral atrophy. Two punctate, remote  microhemorrhages are questioned in the right occipital lobe, with  with an additional one questioned adjacent to the body of the left  lateral ventricle. Major intracranial vascular flow voids are  unremarkable. Orbits are normal. Visualized paranasal sinuses and  mastoid air cells are clear.  IMPRESSION:  1. 5 mm focus of hemorrhage in the left pons.  2. Possible 3 mm focus of acute infarct in the left pons.  3. Remote bilateral basal ganglia, thalamic, and cerebellar infarcts  as above. Moderate chronic microvascular ischemia.  CAROTID DOPPLER PRELIMINARY RESULTS Right = appears to be 1-39% ICA stenosis.  Left = appears to be 1-39% ICA stenosis, although velocities increase to 40-59% range at mid ICA. This could be due to tortuosity since no plaque formation is  noted at this area.   Hazeline Junker, MD 07/23/2013, 8:35 AM PGY-1, [facility] FPTS Intern pager: [phone], text pages welcome

## 2013-07-23 NOTE — Progress Notes (Signed)
FMTS Attending  Note: Angelica Richens,MD I  have seen and examined this patient, reviewed their chart. I have discussed this patient with the resident. I agree with the resident's findings, assessment and care plan.  

## 2013-07-23 NOTE — Progress Notes (Signed)
*  PRELIMINARY RESULTS* Vascular Ultrasound Carotid Duplex (Doppler) has been completed.  Preliminary findings: Technically limited due to poor patient cooperation and increased movement.  Right = appears to be 1-39% ICA stenosis. Left = appears to be 1-39% ICA stenosis, although velocities increase to 40-59% range at mid ICA. This could be due to tortuosity since no plaque formation is noted at this area.   Farrel DemarkJill Eunice, RDMS, RVT  07/23/2013, 9:46 AM

## 2013-07-23 NOTE — Progress Notes (Signed)
PT Cancellation Note  Patient Details Name: Faye RamsayJoan Wingate MRN: [id number] DOB: [birth year]   Cancelled Treatment:    Reason Eval/Treat Not Completed: Patient at procedure or test/unavailable. Pt at CT   Delorse Lekabor, Keimani Laufer Beth 07/23/2013, 1:26 PM Delaney MeigsMaija Tabor Chester Sibert, PT [phone]

## 2013-07-24 ENCOUNTER — Inpatient Hospital Stay (HOSPITAL_COMMUNITY): Payer: Medicare Other

## 2013-07-24 DIAGNOSIS — I10 Essential (primary) hypertension: Secondary | ICD-10-CM | POA: Diagnosis not present

## 2013-07-24 DIAGNOSIS — I509 Heart failure, unspecified: Secondary | ICD-10-CM | POA: Diagnosis not present

## 2013-07-24 DIAGNOSIS — G9389 Other specified disorders of brain: Secondary | ICD-10-CM | POA: Diagnosis not present

## 2013-07-24 LAB — BASIC METABOLIC PANEL
BUN: 29 mg/dL — ABNORMAL HIGH (ref 6–23)
CHLORIDE: 96 meq/L (ref 96–112)
CO2: 40 mEq/L (ref 19–32)
CREATININE: 1.17 mg/dL — AB (ref 0.50–1.10)
Calcium: 9.1 mg/dL (ref 8.4–10.5)
GFR calc non Af Amer: 46 mL/min — ABNORMAL LOW (ref >90)
GFR, EST AFRICAN AMERICAN: 53 mL/min — AB (ref >90)
Glucose, Bld: 127 mg/dL — ABNORMAL HIGH (ref 70–99)
POTASSIUM: 3.8 meq/L (ref 3.7–5.3)
Sodium: 152 mEq/L — ABNORMAL HIGH (ref 137–147)

## 2013-07-24 LAB — CBC
HEMATOCRIT: 40.1 % (ref 36.0–46.0)
HEMOGLOBIN: 13 g/dL (ref 12.0–15.0)
MCH: 25.9 pg — ABNORMAL LOW (ref 26.0–34.0)
MCHC: 32.4 g/dL (ref 30.0–36.0)
MCV: 80 fL (ref 78.0–100.0)
Platelets: 114 10*3/uL — ABNORMAL LOW (ref 150–400)
RBC: 5.01 MIL/uL (ref 3.87–5.11)
RDW: 17.3 % — AB (ref 11.5–15.5)
WBC: 15.8 10*3/uL — AB (ref 4.0–10.5)

## 2013-07-24 LAB — POCT I-STAT 3, ART BLOOD GAS (G3+)
Acid-Base Excess: 18 mmol/L — ABNORMAL HIGH (ref 0.0–2.0)
BICARBONATE: 45.1 meq/L — AB (ref 20.0–24.0)
O2 Saturation: 96 %
PH ART: 7.481 — AB (ref 7.350–7.450)
PO2 ART: 80 mmHg (ref 80.0–100.0)
TCO2: 47 mmol/L (ref 0–100)
pCO2 arterial: 60.5 mmHg (ref 35.0–45.0)

## 2013-07-24 NOTE — Progress Notes (Signed)
Intern paged to discuss use of Lasix while pt NPO and not receiving any IVF. Also expressed concerns over pt continued confusion, lethargy and previous CO2s on ABGs.

## 2013-07-24 NOTE — Progress Notes (Signed)
FMTS Attending  Note: Lindora Alviar,MD I  have seen and examined this patient, reviewed their chart. I have discussed this patient with the resident. I agree with the resident's findings, assessment and care plan.  

## 2013-07-24 NOTE — Evaluation (Signed)
Physical Therapy Evaluation Patient Details Name: Angelica Mcbride MRN: [id number] DOB: Sep 23, 1940 Today's Date: 07/24/2013 Time: 4098-1191 PT Time Calculation (min): 20 min  PT Assessment / Plan / Recommendation History of Present Illness  Angelica Mcbride is an 73 y.o. female presenting to hospital with acute congestive heart failure, hypernatremia, hypertensive urgency requiring Nitro drip. Patient was admitted for her worsening COPD. On hospital day 2 she was noted to have worsening lethargy and confusion. Patient was placed on BIPAP for worsening hypercapnia most recent pCO2 of 86 and 85. CT head was obtained showing a 4 mm high-density focus in the lower left pons which could represent hemorrhagic infarct. 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    Clinical Impression  Pt admitted with the above. Pt currently with functional limitations due to the deficits listed below (see PT Problem List). Pt very lethargic and only opened eyes sitting EOB.  Pt attempted to return to supine while EOB and resisting standing.  Pt appears to be perseverating on "[facility]" when asking orientation questions.  Pt eventually stopped attempting to answer questions and began saying "oh man" over and over.  Pt will benefit from skilled PT to increase their independence and safety with mobility to allow discharge to the venue listed below.      PT Assessment  Patient needs continued PT services    Follow Up Recommendations  CIR    Equipment Recommendations   (TBD)    Recommendations for Other Services Rehab consult   Frequency Min 4X/week    Precautions / Restrictions Precautions Precautions: Fall Restrictions Weight Bearing Restrictions: No   Pertinent Vitals/Pain Pt answered "no" to no pain      Mobility  Bed Mobility Overal bed mobility: Needs Assistance Bed Mobility: Supine to Sit;Sit to Supine Supine to sit: Min assist Sit to supine: Mod assist General bed mobility  comments: (A) to maintain safety due to pt impulsive during bed mobility; Transfers General transfer comment: Pt unable to complete transfer due to resisting movement and attemting to return to supine while sitting EOB Modified Rankin (Stroke Patients Only) Pre-Morbid Rankin Score: No symptoms Modified Rankin: Severe disability    Exercises     PT Diagnosis: Difficulty walking;Generalized weakness;Abnormality of gait  PT Problem List: Decreased strength;Decreased activity tolerance;Decreased balance;Decreased mobility;Decreased cognition;Decreased knowledge of use of DME;Decreased safety awareness PT Treatment Interventions: DME instruction;Gait training;Functional mobility training;Therapeutic activities;Therapeutic exercise;Balance training;Neuromuscular re-education;Cognitive remediation;Patient/family education     PT Goals(Current goals can be found in the care plan section) Acute Rehab PT Goals Patient Stated Goal: Pt did not set PT Goal Formulation: Patient unable to participate in goal setting Time For Goal Achievement: 08/07/13 Potential to Achieve Goals: Good  Visit Information  Last PT Received On: 07/24/13 Assistance Needed: +1 History of Present Illness: Angelica Mcbride is an 73 y.o. female presenting to hospital with acute congestive heart failure, hypernatremia, hypertensive urgency requiring Nitro drip. Patient was admitted for her worsening COPD. On hospital day 2 she was noted to have worsening lethargy and confusion. Patient was placed on BIPAP for worsening hypercapnia most recent pCO2 of 86 and 85. CT head was obtained showing a 4 mm high-density focus in the lower left pons which could represent hemorrhagic infarct. 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         Prior Functioning  Home Living Family/patient expects to be discharged to:: Private residence Living Arrangements: Children Additional Comments: No caregivers present and  pt with minimal verbalization and  unable to answer questions at this time.  Prior Function Comments: No caregivers present and pt with minimal verbalization and unable to answer questions at this time. Communication Communication: Receptive difficulties;Expressive difficulties (Pt appears to be perseverating on "[facility]" when asking orient )    Cognition  Cognition Arousal/Alertness: Lethargic (able to wake up when sitting EOB but quickly returns to slee) Behavior During Therapy: Restless Overall Cognitive Status: Impaired/Different from baseline Area of Impairment: Orientation;Following commands;Awareness;Problem solving;Attention Orientation Level: Disoriented to;Person;Time;Situation Current Attention Level: Focused Following Commands: Follows one step commands inconsistently Awareness: Intellectual Problem Solving: Slow processing;Difficulty sequencing    Extremity/Trunk Assessment Lower Extremity Assessment Lower Extremity Assessment: Difficult to assess due to impaired cognition   Balance Balance Overall balance assessment: Needs assistance Sitting-balance support: Feet supported Sitting balance-Leahy Scale: Fair  End of Session PT - End of Session Equipment Utilized During Treatment: Oxygen (2L) Activity Tolerance: Patient limited by lethargy Patient left: in bed;with call bell/phone within reach Nurse Communication: Mobility status  GP     Tyrees Chopin 07/24/2013, 8:55 AM  Jake SharkWendy Giordan [place], PT DPT [phone]

## 2013-07-24 NOTE — Progress Notes (Signed)
Pt. Pulled her bipap off stating she wanted to take it off. RT placed pt. On 3L [place].

## 2013-07-24 NOTE — Progress Notes (Signed)
ABG obtained.  Co2 high at 60 but previous CO2 was 80  Nurse Melonie Floridaraci Gregory notified on CO2

## 2013-07-24 NOTE — Progress Notes (Signed)
Family Medicine Teaching Service Daily Progress Note Intern Pager: [phone]  Patient name: Angelica Mcbride Medical record number: [id number] Date of birth: February 19, 1941 Age: 73 y.o. Gender: female  Primary Care Provider: No PCP Per Patient Consultants: CCM, neurology Code Status: Full  Pt Overview and Major Events to Date:  1/5: Admitted for acute CHF, lasix 40mg  IV TID 1/6: Off NTG gtt, losartan and norvasc started 1/7: Down nearly 20 lbs from admission. Decrease lasix to 40mg  IV daily 1/8: MRI shows pontine hemmorhage with infarct and multiple bilateral likely hypertensive infarcts 1/10: Continue waxing and waning encephalopathy, repeat CT head pending  Assessment and Plan: Angelica Mcbride is a 73 y.o. year old female presenting with acute congestive heart failure with the background of likely COPD and HTN.   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 - BP 111/61 likely over-treated slightly currently, will monitor for need of decreased meds - With tachycardia will hold lasix - Losartan 100 mg, amlodipine 5 mg, when necessary hydralazine  Acute CHF, preserved EF: History of worsening dyspnea in setting of no medical care. BNP: 11566.0, CXR with pulmonary congestion and cardiac enlargement. - 2D ECHO: EF 55%, likely diastolic dysfunction with moderate LVH, pulmonary HTN.  - ECG 1/6 unchanged from 1/5: LAD, LVH, LAFB, and nonspecific flipped T waves in lateral leads  -  Lasix 40mg  IV daily held today, Cr stable with weight loss of ~20lbs. since admission  Acute respiratory failure: Presumptive COPD dx with 50 pack-year hx, 40 year hx chronic cough. Bronchitic changes on CXR. Chronic, compensated respiratory acidosis and hypoxia on ABG. - O2 prn - Avoiding beta-blocker - Duonebs prn wheezing - ABG today with PCO2 decreased to 60,   Paroxysmal ventricular tachycardia Noted 2 runs 1/7, given Mag. No symptoms. - ECG 1/7 showed sinus rhythm with PVCs, LAD, LAFB, TWI in lateral leads; ECG 1/8 stable - Troponins neg x2  Hypomagnesemia Resolved 1/8 s/p repletion  Hypernatremia, mild: Resolved 1/6  Anemia, mild, normocytic: hgb 11.0, continue to monitor.   FEN/GI: Heart healthy diet, Strict I/O, Saline Lock IV  Prophylaxis: SCDs, anti-coagulants stopped for brain bleed  Disposition: Continue respiratory management and close monitoring of mental status in SDU. At this point she is multifactorial encephalopathy. Possible etiologies attributing include but not limited to relative hypotension, ICU delirium, acute hemorrhage, or infectious etiology.  At this point infectious etiology seems unlikely without fever, however if she continues to be profoundly encephalopathic without explanation will consider LP.  Subjective: Pt poorly arousable unable to tell me which hospital she is in this morning. Unable to tell her name or time.  Objective: Temp:  [98 F (36.7 C)-99.3 F (37.4 C)] 98 F (36.7 C) (01/10 1130) Pulse Rate:  [93-123] 108 (01/10 1100) Resp:  [15-33] 24 (01/10 1100) BP: (111-193)/(54-125) 111/61 mmHg (01/10 1100) SpO2:  [94 %-99 %] 98 % (01/10 1100) Weight:  [114 lb 10.2 oz (52 kg)] 114 lb 10.2 oz (52 kg) (01/10 0342)  Physical Exam: Gen: elderly somnolent female  HEENT: NCAT,  EOMI, MMM, supple neck with full range of motion. CV: RRR, no murmur  Resp: Overall diminished breath sounds, normal work of breathing, no wheezes noted  Abd: SNTND, BS  present, no guarding or organomegaly  Ext: no edema, warm, no tenderness in bilateral legs.  Neuro: Poorly arousable, oriented x1 place, not oriented to person or time.   Laboratory:  Recent Labs Lab 07/19/13 1310 07/20/13 2204  WBC 4.6 7.1  HGB 11.8* 11.0*  HCT 36.5 34.7*  PLT 108* 96*    Recent Labs Lab 07/21/13 0213 07/21/13 1705 07/22/13 0452  NA 147 147 146  K 3.7 3.6* 4.2  CL 98 94* 95*  CO2 42* 35* 39*  BUN 7 9 10   CREATININE 0.84 0.80 0.80  CALCIUM 8.2* 8.3* 8.2*  GLUCOSE 102* 88 95    Recent Labs Lab 07/19/13 1347 07/20/13 2104 07/21/13 1219 07/21/13 1709 07/24/13 1110  PHART 7.302* 7.412 7.340* 7.349* 7.481*  PCO2ART 63.9* 71.4* 86.3* 85.4* 60.5*  PO2ART 97.0 49.0* 126.0* 79.0* 80.0  HCO3 31.6* 45.3* 46.5* 47.1* 45.1*  TCO2 33 47 49 50 47  O2SAT 96.0 81.0 98.0 94.0 96.0      07/20/2013 07:30  Cholesterol 132  Triglycerides 62  HDL 40  LDL (calc) 80  VLDL 12  Total CHOL/HDL Ratio 3.3    07/19/2013 16:10  Pro B Natriuretic peptide (BNP) 11566.0 (H)    07/19/2013 13:21  Color, Urine YELLOW  APPearance CLEAR  Specific Gravity, Urine 1.016  pH 5.5  Glucose NEGATIVE  Bilirubin Urine NEGATIVE  Ketones, ur NEGATIVE  Protein 100 (A)  Urobilinogen, UA 1.0  Nitrite NEGATIVE  Leukocytes, UA NEGATIVE  Hgb urine dipstick SMALL (A)  WBC, UA 0-2  RBC / HPF 0-2  Squamous Epithelial / LPF RARE  Bacteria, UA RARE  Casts HYALINE CASTS (A)   ECHO 1/6 - Left ventricle: The cavity size was normal. Wall thickness was increased in a pattern of moderate LVH. The estimated ejection fraction was 55%. Regional wall motion abnormalities cannot be excluded. - Mitral valve: Mild regurgitation. - Left atrium: The atrium was mildly dilated. - Pulmonary arteries: PA peak pressure: 44mm Hg (S). - Pericardium, extracardiac: A small to moderate pericardial effusion was identified posterior to the heart. There was no evidence of hemodynamic  compromise.  Imaging/Diagnostic Tests: CXR 1VW FINDINGS:  Enlargement of cardiac silhouette with pulmonary vascular  congestion.  Mediastinal contours normal.  Bibasilar pleural effusions and atelectasis.  Minimal accentuation of perihilar markings the question bronchitis.  No definite acute pulmonary edema or segmental consolidation.  No pneumothorax.  Bones demineralized.  IMPRESSION:  Enlargement of cardiac silhouette with pulmonary vascular  congestion.  Bronchitic changes with bibasilar effusions and probable mild  bibasilar atelectasis.  CT HEAD WITHOUT CONTRAST  FINDINGS:  Skull and Sinuses:No significant abnormality.  Orbits: No acute abnormality.  Brain: There is a 4 mm high-density focus in the lower left pons.  There may be minimal surrounding low-attenuation.  No evidence of acute large vessel territory infarct, hydrocephalus,  mass lesion, or shift. There have been perforator infarcts in the  bilateral basal ganglia, the larger in the right lenticulostriate  distribution affecting the caudate and anterior internal capsule.  Bilateral putaminal lacunar infarcts. Right thalamic lacunar  infarct. Diffuse small vessel ischemic white matter injury.  Critical Value/emergent results were called by telephone at the time  of interpretation on 07/22/2013 at 2:25 AM to Dr. Herma Carson", who verbally  acknowledged these results.  IMPRESSION:  1. 4 mm presumed hemorrhage  in the lower left pons.  2. Extensive small-vessel ischemic injury, with remote appearing  bilateral perforator infarcts.  MRI HEAD WITHOUT CONTRAST  FINDINGS:  Images are moderately degraded by motion artifact.  There is question of a 3 mm focus of restricted diffusion in the  left pons (series 8, image 10). As seen on recent CT, there is a 5  mm small focus of hemorrhage in the left pons. Remote pontine  infarcts are noted. Remote infarcts are also noted involving the  left cerebellum, bilateral basal ganglia and  thalami, and left  centrum semiovale. Periventricular T2 hyperintensities are  compatible with moderate chronic small vessel ischemic disease.  There is moderate cerebral atrophy. Two punctate, remote  microhemorrhages are questioned in the right occipital lobe, with  with an additional one questioned adjacent to the body of the left  lateral ventricle. Major intracranial vascular flow voids are  unremarkable. Orbits are normal. Visualized paranasal sinuses and  mastoid air cells are clear.  IMPRESSION:  1. 5 mm focus of hemorrhage in the left pons.  2. Possible 3 mm focus of acute infarct in the left pons.  3. Remote bilateral basal ganglia, thalamic, and cerebellar infarcts  as above. Moderate chronic microvascular ischemia.  CAROTID DOPPLER PRELIMINARY RESULTS Right = appears to be 1-39% ICA stenosis.  Left = appears to be 1-39% ICA stenosis, although velocities increase to 40-59% range at mid ICA. This could be due to tortuosity since no plaque formation is noted at this area.   Elenora GammaSamuel L Dain Laseter, MD 07/24/2013, 11:42 AM PGY-1, [facility] FPTS Intern pager: [phone], text pages welcome

## 2013-07-24 NOTE — Progress Notes (Signed)
SLP Cancellation Note  Patient Details Name: Angelica Mcbride MRN: [id number] DOB: 1940/09/29   Cancelled evaluation:  Spoke with RN - pt still not able to maintain alertness sufficiently for swallow eval.  Asked RN to call if LOA improves.          Tamlyn Sides L. Samson Fredericouture, [place]MA CCC/SLP Pager [phone]   Blenda MountsCouture, Dejah Droessler Laurice 07/24/2013, 9:32 AM

## 2013-07-25 DIAGNOSIS — I509 Heart failure, unspecified: Secondary | ICD-10-CM | POA: Diagnosis not present

## 2013-07-25 DIAGNOSIS — I10 Essential (primary) hypertension: Secondary | ICD-10-CM | POA: Diagnosis not present

## 2013-07-25 LAB — BASIC METABOLIC PANEL
BUN: 33 mg/dL — AB (ref 6–23)
BUN: 36 mg/dL — AB (ref 6–23)
CHLORIDE: 98 meq/L (ref 96–112)
CO2: 44 mEq/L (ref 19–32)
CO2: 37 mEq/L — ABNORMAL HIGH (ref 19–32)
Calcium: 9.2 mg/dL (ref 8.4–10.5)
Calcium: 8.7 mg/dL (ref 8.4–10.5)
Chloride: 98 mEq/L (ref 96–112)
Creatinine, Ser: 1.04 mg/dL (ref 0.50–1.10)
Creatinine, Ser: 1.01 mg/dL (ref 0.50–1.10)
GFR calc Af Amer: 61 mL/min — ABNORMAL LOW (ref >90)
GFR calc Af Amer: 63 mL/min — ABNORMAL LOW (ref >90)
GFR calc non Af Amer: 53 mL/min — ABNORMAL LOW (ref >90)
GFR calc non Af Amer: 55 mL/min — ABNORMAL LOW (ref >90)
GLUCOSE: 99 mg/dL (ref 70–99)
Glucose, Bld: 95 mg/dL (ref 70–99)
POTASSIUM: 3.6 meq/L — AB (ref 3.7–5.3)
Potassium: 3.5 mEq/L — ABNORMAL LOW (ref 3.7–5.3)
SODIUM: 154 meq/L — AB (ref 137–147)
Sodium: 155 mEq/L — ABNORMAL HIGH (ref 137–147)

## 2013-07-25 LAB — CBC
HEMATOCRIT: 40.9 % (ref 36.0–46.0)
Hemoglobin: 13 g/dL (ref 12.0–15.0)
MCH: 25.6 pg — AB (ref 26.0–34.0)
MCHC: 31.8 g/dL (ref 30.0–36.0)
MCV: 80.5 fL (ref 78.0–100.0)
Platelets: 116 10*3/uL — ABNORMAL LOW (ref 150–400)
RBC: 5.08 MIL/uL (ref 3.87–5.11)
RDW: 17 % — ABNORMAL HIGH (ref 11.5–15.5)
WBC: 11.7 10*3/uL — ABNORMAL HIGH (ref 4.0–10.5)

## 2013-07-25 MED ORDER — ASPIRIN EC 81 MG PO TBEC
81.0000 mg | DELAYED_RELEASE_TABLET | Freq: Every day | ORAL | Status: DC
  Administered 2013-07-26: 81 mg via ORAL
  Filled 2013-07-25 (×3): qty 1

## 2013-07-25 MED ORDER — SODIUM CHLORIDE 0.45 % IV SOLN
INTRAVENOUS | Status: DC
  Administered 2013-07-25: 125 mL/h via INTRAVENOUS
  Administered 2013-07-26: 01:00:00 via INTRAVENOUS

## 2013-07-25 MED ORDER — LOSARTAN POTASSIUM 50 MG PO TABS
50.0000 mg | ORAL_TABLET | Freq: Every day | ORAL | Status: DC
  Administered 2013-07-26 – 2013-07-28 (×3): 50 mg via ORAL
  Filled 2013-07-25 (×5): qty 1

## 2013-07-25 MED ORDER — SODIUM CHLORIDE 0.9 % IV SOLN: INTRAVENOUS | Status: DC

## 2013-07-25 MED ORDER — HYDRALAZINE HCL 20 MG/ML IJ SOLN
10.0000 mg | INTRAMUSCULAR | Status: DC | PRN
  Administered 2013-07-25 – 2013-07-27 (×5): 10 mg via INTRAVENOUS
  Filled 2013-07-25 (×5): qty 1

## 2013-07-25 NOTE — Progress Notes (Signed)
SLP Cancellation Note  Patient Details Name: Faye RamsayJoan Keehan MRN: [id number] DOB: Nov 04, 1940   Cancelled treatment:  BSE deferred to 05/26/14. Moreen FowlerKaren Milyn Stapleton MS, CCC-SLP [phone] [facility] Surgery Of AustinDANKOF,Keldrick Pomplun 07/25/2013, 8:06 PM

## 2013-07-25 NOTE — Progress Notes (Signed)
Stroke Team Progress Note  HISTORY Majority of history obtained from the chart.  Angelica Mcbride is an 73 y.o. female presenting to hospital with acute congestive heart failure, hypernatremia, hypertensive urgency requiring Nitro drip. Patient was admitted for her worsening COPD. On hospital day 2 she was noted to have worsening lethargy and confusion. Patient was placed on BIPAP for worsening hypercapnia most recent pCO2 of 86 and 85. CT head was obtained showing a 4 mm high-density focus in the lower left pons which could represent hemorrhagic infarct. 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      Patient was not a TPA candidate secondary to  Possible hemorrhage on CT. She was admitted to the  ICU  for further evaluation and treatment.  SUBJECTIVE Her  RN  is at the bedside.  Overall she feels her condition is gradually improving. She is more alert and interactive.Repeat CT head shows resolution of tiny left Pontine microhemorrhage  OBJECTIVE Most recent Vital Signs: Filed Vitals:   07/25/13 0900 07/25/13 1000 07/25/13 1100 07/25/13 1300  BP: 138/57 135/69 166/64   Pulse: 99 98 100   Temp:    99.3 F (37.4 C)  TempSrc:    Oral  Resp: 14 20    Height:      Weight:      SpO2: 100% 99% 89%    CBG (last 3)  No results found for this basename: GLUCAP,  in the last 72 hours  IV Fluid Intake:   . sodium chloride 125 mL/hr (07/25/13 1302)    MEDICATIONS  . influenza vac split quadrivalent PF  0.5 mL Intramuscular Tomorrow-1000  . LORazepam  1 mg Intravenous Once  . losartan  50 mg Oral Daily  . pneumococcal 23 valent vaccine  0.5 mL Intramuscular Tomorrow-1000  . sodium chloride  3 mL Intravenous Q12H  . sodium chloride  3 mL Intravenous Q12H   PRN:  sodium chloride, acetaminophen, acetaminophen, hydrALAZINE, ipratropium-albuterol, sodium chloride  Diet:  NPO  s Activity:  Bedrest  DVT Prophylaxis:  SCDs CLINICALLY SIGNIFICANT STUDIES Basic  Metabolic Panel:   Recent Labs Lab 07/21/13 1705 07/22/13 0452 07/24/13 1315 07/25/13 0232  NA 147 146 152* 154*  K 3.6* 4.2 3.8 3.5*  CL 94* 95* 96 98  CO2 35* 39* 40* 44*  GLUCOSE 88 95 127* 99  BUN 9 10 29* 33*  CREATININE 0.80 0.80 1.17* 1.04  CALCIUM 8.3* 8.2* 9.1 9.2  MG 1.3* 3.1*  --   --    Liver Function Tests: No results found for this basename: AST, ALT, ALKPHOS, BILITOT, PROT, ALBUMIN,  in the last 168 hours CBC:  Recent Labs Lab 07/19/13 1310  07/24/13 1315 07/25/13 0232  WBC 4.6  < > 15.8* 11.7*  NEUTROABS 2.9  --   --   --   HGB 11.8*  < > 13.0 13.0  HCT 36.5  < > 40.1 40.9  MCV 79.3  < > 80.0 80.5  PLT 108*  < > 114* 116*  < > = values in this interval not displayed. Coagulation: No results found for this basename: LABPROT, INR,  in the last 168 hours Cardiac Enzymes:   Recent Labs Lab 07/20/13 0730 07/21/13 1904 07/22/13 0452  TROPONINI <0.30 <0.30 <0.30   Urinalysis:   Recent Labs Lab 07/19/13 1321  COLORURINE YELLOW  LABSPEC 1.016  PHURINE 5.5  GLUCOSEU NEGATIVE  HGBUR SMALL*  BILIRUBINUR NEGATIVE  KETONESUR NEGATIVE  PROTEINUR 100*  UROBILINOGEN 1.0  NITRITE  NEGATIVE  LEUKOCYTESUR NEGATIVE   Lipid Panel    Component Value Date/Time   CHOL 132 07/20/2013 0730   TRIG 62 07/20/2013 0730   HDL 40 07/20/2013 0730   CHOLHDL 3.3 07/20/2013 0730   VLDL 12 07/20/2013 0730   LDLCALC 80 07/20/2013 0730   HgbA1C  Lab Results  Component Value Date   HGBA1C 6.3* 07/20/2013    Urine Drug Screen:   No results found for this basename: labopia,  cocainscrnur,  labbenz,  amphetmu,  thcu,  labbarb    Alcohol Level: No results found for this basename: ETH,  in the last 168 hours  Ct Head Wo Contrast  07/24/2013   CLINICAL DATA:  Followup pontine lesion, possibly hemorrhage. Worsening lethargy and confusion.  EXAM: CT HEAD WITHOUT CONTRAST  TECHNIQUE: Contiguous axial images were obtained from the base of the skull through the vertex without  intravenous contrast.  COMPARISON:  Head CT 07/22/2013.  MRI 07/22/2013.  FINDINGS: 3-4 mm focus of hyperdensity previously seen in the left side of the pons is no longer discernible. Therefore, this probably represented a small hemorrhage. No evidence of additional bleeding. Chronic microvascular changes remain evident throughout the pons and cerebellum. The cerebral hemispheres again show an old lacunar infarction in the right thalamus with old small vessel infarctions within the basal ganglia and throughout the hemispheric deep white matter. No sign of acute infarction. No hydrocephalus or extra-axial collection.  IMPRESSION: Extensive chronic small vessel ischemic changes throughout the brain.  The previously seen 3 mm focus of high density in the left pons is no longer visible. If this represented a small hemorrhage, it has spread out/become less dense such that it is no longer visible. Certainly there is no additional bleeding.   Electronically Signed   By: Paulina FusiMark  Shogry M.D.   On: 07/24/2013 15:27   Ct Angio Chest Pe W/cm &/or Wo Cm  07/23/2013   CLINICAL DATA:  Congestive heart failure. Worsening COPD. Rule out pulmonary embolism.  EXAM: CT ANGIOGRAPHY CHEST WITH CONTRAST  TECHNIQUE: Multidetector CT imaging of the chest was performed using the standard protocol during bolus administration of intravenous contrast. Multiplanar CT image reconstructions including MIPs were obtained to evaluate the vascular anatomy.  CONTRAST:  80mL OMNIPAQUE IOHEXOL 350 MG/ML SOLN  COMPARISON:  Chest x-ray 07/21/2013  FINDINGS: Lungs are adequately inflated with mild emphysematous disease. There are small bilateral pleural effusions left worse than right with associated compressive atelectasis in the left base. There is a 4.5 mm nodule over the right middle lobe. There is mild cardiomegaly. There is a small pericardial effusion. The pulmonary arterial system is within normal without evidence of emboli. There is no significant  hilar, mediastinal or axillary adenopathy. There is very minimal calcification over the left anterior descending coronary artery.  Limited images through the upper abdomen demonstrate a sub cm hypodensity over the dome of the liver likely a small cyst or hemangioma. Remainder of the exam is unremarkable.  Review of the MIP images confirms the above findings.  IMPRESSION: No evidence of pulmonary embolism.  Small pericardial effusion with mild cardiomegaly.  Mild emphysematous disease. Small bilateral pleural effusions left greater than right with associated compressive atelectasis in the left base.  4.5 mm nodule over the right middle lobe. Recommend follow-up noncontrast chest CT in 6 months. This recommendation follows the consensus statement: Guidelines for Management of Small Pulmonary Nodules Detected on CT Scans: A Statement from the Fleischner Society as published in Radiology 2005; 237:395-400. Online at:  DietDisorder.cz.  Sub cm hypodensity over the dome of the liver likely a cyst or hemangioma.   Electronically Signed   By: Elberta Fortis M.D.   On: 07/23/2013 14:03      2D Echocardiogram  Normal EF  Carotid Doppler  40-59 % left mid ICA stenosis  CXR    EKG  Normal sinus rhythm Biatrial enlargement Left anterior fascicular block Left ventricular hypertrophy T wave abnormality, consider lateral ischemia unchanged from prior tracing Prolonged QT Therapy Recommendations pending Physical Exam   Frail elderly african american lady not in distress.on nasal oxygen mask. . Afebrile. Head is nontraumatic. Neck is supple without bruit.  . Cardiac exam no murmur or gallop. Lungs are clear to auscultation. Distal pulses are well felt. Neurological Exam : Awake and alert at present. Follows simple midline and one-step commands only. Distractible. Diminished attention, registration and recall. Oriented to time place and person. Speaks only short sentences and few  words. Extraocular moments are full range without nystagmus. Pupils irregular but reactive. Blinks to threat bilaterally. No facial weakness. Able to move all 4 extremities equally well against gravity without any focal weakness. Deep tendon reflexes are symmetric. Plantars are both downgoing. Gait was not tested. No flaps or tremors are noted. ASSESSMENT Ms. Angelica Mcbride is a 73 y.o. female  who developed lethargy and confusion 2 days after admission for congestive heart failure, hyper carbia and hypertensive urgency. She has a nonfocal exam. CT scan of the head showed a 4 mm left pontine hyperdensity an MRI scan shows multiple remote age lacunar infarcts as well as one tiny 2 mm left pontine diffusion-positive lesions as well as a small 4 mm left pontine hemorrhage versus calcification. The patient's clinical presentation and exam findings are not compatible with acute pontine hemorrhage or infarct and these imaging findings are likely incidental and related to hypertensive microvascular disease and hypertensive urgency. Have primary neurological presentation is likely related to encephalopathy from CO2 narcosis and hypertensive urgency. On no prior to admission. Now on  no for secondary stroke prevention. Patient with resultant improving mental status and confusion Work up underway.      Hospital day # 6  TREATMENT/PLAN  Continue ongoing medical management for her encephalopathy and strict blood pressure control.   head in a.m. and may Start aspirin 81 mg for secondary stroke prevention. Mobilize patient out of bed with physical occupational therapy consults. No further stroke workup is necessary at this time. Stroke team will sign off    Delia Heady, MD 07/25/2013 1:30 PM

## 2013-07-25 NOTE — Progress Notes (Signed)
FMTS Attending  Note: Angelica Eniola,MD I  have seen and examined this patient, reviewed their chart. I have discussed this patient with the resident. I agree with the resident's findings, assessment and care plan.  

## 2013-07-25 NOTE — Progress Notes (Signed)
Family Medicine Teaching Service Daily Progress Note Intern Pager: [phone]  Patient name: Angelica Mcbride Medical record number: [id number] Date of birth: [birth year] Age: 73 y.o. Gender: female  Primary Care Provider: No PCP Per Patient Consultants: CCM, neurology Code Status: Full  Pt Overview and Major Events to Date:  1/5: Admitted for acute CHF, lasix 40mg  IV TID 1/6: Off NTG gtt, losartan and norvasc started 1/7: Down nearly 20 lbs from admission. Decrease lasix to 40mg  IV daily 1/8: MRI shows pontine hemmorhage with infarct and multiple bilateral likely hypertensive infarcts 1/10: Continue waxing and waning encephalopathy, Repeat CT improved hemorrhage  Assessment and Plan: Angelica RamsayJoan Mcbride is a 73 y.o. year old female presenting with acute congestive heart failure with the background of likely COPD and HTN.   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  - Goal BP < 170mmHg given improved mentation with higher BP and improving hemorrhage - Anticoagulants held - Carotid dopplers unremarkable, risk labs negative - Proceed with MRA brain without contrast - Consult SLP, PT, OT; consider palliative care consult  Malignant Hypertension: Likely cause of pontine hemorrhage. - has been npo and receiving only prn hydralazine - if okay for PO start ARB (Losartan 50); consider adding Amlodipine 5mg  if continues to be above goal and needing hydralzine  Acute CHF: preserved EF: History of worsening dyspnea in setting of no medical care. BNP: 11566.0, CXR with pulmonary congestion and cardiac enlargement. - 2D ECHO: EF 55%, likely diastolic dysfunction with moderate LVH, pulmonary HTN.  - ECG 1/6 unchanged from 1/5: LAD, LVH, LAFB, and nonspecific flipped T waves in lateral leads   Hypernatremia: likely iatrogenic hypernatremia;  - d/c  daily lasix - 1L IV 1/2 NS fluids over 8 hours  Acute respiratory failure: Presumptive COPD dx with 50 pack-year hx, 40 year hx chronic cough. Bronchitic changes on CXR. Chronic, compensated respiratory acidosis and hypoxia on ABG. - O2 prn - Avoiding beta-blocker - Duonebs prn wheezing - ABG 11/10 with PCO2 decreased to 60  Paroxysmal ventricular tachycardia Noted 2 runs 1/7, given Mag. No symptoms. - ECG 1/7 showed sinus rhythm with PVCs, LAD, LAFB, TWI in lateral leads; ECG 1/8 stable - Troponins neg x2  Hypomagnesemia Resolved 1/8 s/p repletion  Anemia + thrombocytopenia, mild, normocytic: hgb 11.0, continue to monitor.   Pulmonary Nodule: 4.765mmg Nodule in R middle lobes, needs follow CT in 6 months  FEN/GI: Heart healthy diet, Strict I/O, Saline Lock IV  Prophylaxis: SCDs, anti-coagulants stopped for brain bleed  Disposition: Continue respiratory management and close monitoring of mental status in SDU. At this point she is multifactorial encephalopathy. Possible etiologies attributing include but not limited to relative hypotension, ICU delirium, acute hemorrhage, or infectious etiology.  At this point infectious etiology seems unlikely without fever, however if she continues to be profoundly encephalopathic could consider LP but no meningeal signs and no evidence on MRI  Subjective: Pt poorly arousable oriented to person and place not time or situation.   Objective: Temp:  [97.4 F (36.3 C)-98.8 F (37.1 C)] 98.8 F (37.1 C) (01/11 0839) Pulse Rate:  [83-108] 101 (01/11 0839) Resp:  [15-37] 17 (01/11 0839) BP: (111-187)/(52-91) 156/80 mmHg (01/11 0839) SpO2:  [96 %-100 %] 100 % (01/11 0839) Weight:  [112 lb 14 oz (51.2 kg)] 112 lb 14 oz (51.2 kg) (01/11 0332)  Physical Exam: Gen: elderly somnolent  female able to follow some instructions.  + Echolalia HEENT: NCAT, EOMI, MMM, supple neck with full range of motion. CV: RRR, no murmur  Resp: Overall diminished breath  sounds, normal work of breathing, no wheezes noted  Abd: SNTND, BS present, no guarding or organomegaly  Ext: no edema, warm, no tenderness in bilateral legs.  Neuro: Poorly arousable, oriented x1 place, not oriented to person or time; no lateralization of extremities.  Able to sit in bed unassisted  Laboratory:  Recent Labs Lab 07/20/13 2204 07/24/13 1315 07/25/13 0232  WBC 7.1 15.8* 11.7*  HGB 11.0* 13.0 13.0  HCT 34.7* 40.1 40.9  PLT 96* 114* 116*    Recent Labs Lab 07/22/13 0452 07/24/13 1315 07/25/13 0232  NA 146 152* 154*  K 4.2 3.8 3.5*  CL 95* 96 98  CO2 39* 40* 44*  BUN 10 29* 33*  CREATININE 0.80 1.17* 1.04  CALCIUM 8.2* 9.1 9.2  GLUCOSE 95 127* 99    Recent Labs Lab 07/19/13 1347 07/20/13 2104 07/21/13 1219 07/21/13 1709 07/24/13 1110  PHART 7.302* 7.412 7.340* 7.349* 7.481*  PCO2ART 63.9* 71.4* 86.3* 85.4* 60.5*  PO2ART 97.0 49.0* 126.0* 79.0* 80.0  HCO3 31.6* 45.3* 46.5* 47.1* 45.1*  TCO2 33 47 49 50 47  O2SAT 96.0 81.0 98.0 94.0 96.0     Recent Labs  07/20/13 0020 07/20/13 0730  HGBA1C 6.3*  --   TRIG  --  62  CHOL  --  132  HDL  --  40  LDLCALC  --  80  TSH 0.657  --     Recent Labs Lab 07/19/13 1310  07/20/13 0730 07/21/13 1904 07/22/13 0452  TROPONINI <0.30  < > <0.30 <0.30 <0.30  PROBNP 11566.0*  --   --   --   --   < > = values in this interval not displayed.  07/19/13 - UA - 100 protein, neg UTI   Imaging/Diagnostic Tests: 1/5 1V CXR - Vascular Congestion, + Bronchitis 1/7 1V CXR - Improving congestion 1/8 CT Head w/o contrast - 4mm L pons hemorrhage; extensive small-vessel ischemic changes with remote infarcts 1/8 MRI Head w/o contrast - 5mm L pons hemorrhage; 3mm L pons acute infarct; remote B basal ganglia, thalamic, cerebellar infarcts; moderate microvascular ischemia 1/9 - CTA Chest - neg for PE, + nodule, mild emphysema; small B pleural effusions; small pericardial effusion  ECHO 1/6: + LVH, EF 55% poorly  visualized wall motion. PA peak pressure . Small pericardial effusion Carotid doppler study: 1-39% B; some tortuosity with ? 40-50% at L mid ICA   Andrena Mews, DO 07/25/2013, 10:56 AM PGY-1, [facility] FPTS Intern pager: [phone], text pages welcome

## 2013-07-26 ENCOUNTER — Inpatient Hospital Stay (HOSPITAL_COMMUNITY): Payer: Medicare Other

## 2013-07-26 DIAGNOSIS — I509 Heart failure, unspecified: Secondary | ICD-10-CM | POA: Diagnosis not present

## 2013-07-26 DIAGNOSIS — I5022 Chronic systolic (congestive) heart failure: Secondary | ICD-10-CM | POA: Diagnosis not present

## 2013-07-26 DIAGNOSIS — I10 Essential (primary) hypertension: Secondary | ICD-10-CM | POA: Diagnosis not present

## 2013-07-26 DIAGNOSIS — G9340 Encephalopathy, unspecified: Secondary | ICD-10-CM

## 2013-07-26 DIAGNOSIS — I619 Nontraumatic intracerebral hemorrhage, unspecified: Secondary | ICD-10-CM | POA: Diagnosis not present

## 2013-07-26 LAB — COMPREHENSIVE METABOLIC PANEL
ALT: 16 U/L (ref 0–35)
AST: 33 U/L (ref 0–37)
Albumin: 2.7 g/dL — ABNORMAL LOW (ref 3.5–5.2)
Alkaline Phosphatase: 47 U/L (ref 39–117)
BILIRUBIN TOTAL: 0.5 mg/dL (ref 0.3–1.2)
BUN: 34 mg/dL — AB (ref 6–23)
CO2: 36 meq/L — AB (ref 19–32)
CREATININE: 0.93 mg/dL (ref 0.50–1.10)
Calcium: 8.6 mg/dL (ref 8.4–10.5)
Chloride: 99 mEq/L (ref 96–112)
GFR, EST AFRICAN AMERICAN: 70 mL/min — AB (ref >90)
GFR, EST NON AFRICAN AMERICAN: 60 mL/min — AB (ref >90)
Glucose, Bld: 87 mg/dL (ref 70–99)
Potassium: 4.1 mEq/L (ref 3.7–5.3)
Sodium: 151 mEq/L — ABNORMAL HIGH (ref 137–147)
Total Protein: 7 g/dL (ref 6.0–8.3)

## 2013-07-26 LAB — CBC
HCT: 42.7 % (ref 36.0–46.0)
Hemoglobin: 13.7 g/dL (ref 12.0–15.0)
MCH: 25.7 pg — ABNORMAL LOW (ref 26.0–34.0)
MCHC: 32.1 g/dL (ref 30.0–36.0)
MCV: 80.1 fL (ref 78.0–100.0)
PLATELETS: 128 10*3/uL — AB (ref 150–400)
RBC: 5.33 MIL/uL — AB (ref 3.87–5.11)
RDW: 17.4 % — ABNORMAL HIGH (ref 11.5–15.5)
WBC: 11.9 10*3/uL — AB (ref 4.0–10.5)

## 2013-07-26 MED ORDER — DEXTROSE 5 % IV SOLN
INTRAVENOUS | Status: DC
  Administered 2013-07-26: 125 mL via INTRAVENOUS

## 2013-07-26 MED ORDER — BIOTENE DRY MOUTH MT LIQD
15.0000 mL | Freq: Two times a day (BID) | OROMUCOSAL | Status: DC
  Administered 2013-07-26 – 2013-07-29 (×7): 15 mL via OROMUCOSAL

## 2013-07-26 MED ORDER — CHLORHEXIDINE GLUCONATE 0.12 % MT SOLN
15.0000 mL | Freq: Two times a day (BID) | OROMUCOSAL | Status: DC
  Administered 2013-07-26 – 2013-07-29 (×4): 15 mL via OROMUCOSAL
  Filled 2013-07-26 (×7): qty 15

## 2013-07-26 MED ORDER — HYDRALAZINE HCL 20 MG/ML IJ SOLN
10.0000 mg | INTRAMUSCULAR | Status: DC
  Administered 2013-07-26 (×2): 10 mg via INTRAVENOUS
  Administered 2013-07-26 (×2): via INTRAVENOUS
  Administered 2013-07-27 (×3): 10 mg via INTRAVENOUS
  Filled 2013-07-26: qty 1
  Filled 2013-07-26: qty 0.5
  Filled 2013-07-26: qty 1
  Filled 2013-07-26 (×5): qty 0.5
  Filled 2013-07-26: qty 1
  Filled 2013-07-26: qty 0.5

## 2013-07-26 NOTE — Progress Notes (Signed)
FMTS Attending Note  I personally saw and evaluated the patient. The plan of care was discussed with the resident team. I agree with the assessment and plan as documented by the resident.   Patient laying in bed, awake but does not answer questions  1. CHF exacerbation/volume overload - resolved 2. Acute respiratory failure - resolved 3. 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 Malignant Hypertension - blood pressure improved, tolerate systolic BPs up to 170's in the short term to prevent against cerebral hypoperfusion  Donnella ShamKyle Jocsan Mcginley MD

## 2013-07-26 NOTE — Evaluation (Signed)
Occupational Therapy Evaluation Patient Details Name: Angelica Mcbride MRN: [id number] DOB: [birth year] Today's Date: 07/26/2013 Time: [id number] OT Time Calculation (min): 32 min  OT Assessment / Plan / Recommendation History of present illness Angelica Mcbride is an 73 y.o. female presenting to hospital with acute congestive heart failure, hypernatremia, hypertensive urgency requiring Nitro drip. Patient was admitted for her worsening COPD. On hospital day 2 she was noted to have worsening lethargy and confusion. Patient was placed on BIPAP for worsening hypercapnia most recent pCO2 of 86 and 85. CT head was obtained showing a 4 mm high-density focus in the lower left pons which could represent hemorrhagic infarct. 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     Clinical Impression   Pt admitted with above.  She presents to OT with the below listed deficits and will benefit from continued OT to maximize safety and independence with BADLs.  Pt. Demonstrates confusion, but is now oriented to person, place, situation, month and year;  She requires min - mod A overall for BADLs.  BP 159/78 at beginning of session with pt supine, and 196/82 at end of session after transfer to chair.  Sats 96% on 2L at rest; 89% with transfer to chair, but rebounded quickly.   Anticipate she will make good progress, recommend CIR    OT Assessment  Patient needs continued OT Services    Follow Up Recommendations  CIR;Supervision/Assistance - 24 hour    Barriers to Discharge Other (comment) (unknown)    Equipment Recommendations  None recommended by OT    Recommendations for Other Services    Frequency  Min 2X/week    Precautions / Restrictions Precautions Precautions: Fall Restrictions Weight Bearing Restrictions: No   Pertinent Vitals/Pain     ADL  Eating/Feeding: NPO Grooming: Wash/dry hands;Wash/dry face;Min guard Where Assessed - Grooming: Unsupported sitting Upper Body  Bathing: Moderate assistance Where Assessed - Upper Body Bathing: Supported sitting Lower Body Bathing: Moderate assistance Where Assessed - Lower Body Bathing: Supported sit to stand Upper Body Dressing: Moderate assistance Where Assessed - Upper Body Dressing: Unsupported sitting Lower Body Dressing: Minimal assistance Where Assessed - Lower Body Dressing: Supported sit to stand Toilet Transfer: Minimal assistance Toilet Transfer Method: Sit to stand;Stand pivot AcupuncturistToilet Transfer Equipment: Bedside commode Toileting - Clothing Manipulation and Hygiene: Moderate assistance Where Assessed - Toileting Clothing Manipulation and Hygiene: Standing Transfers/Ambulation Related to ADLs: min A to stand pivot to recliner ADL Comments: Pt able to don/doff socks with min guard assist.  Pt. requires assist with BADLs for balance, and due to cognitive deficits as she is unable to sustain attention to activity.      OT Diagnosis: Generalized weakness;Cognitive deficits;Disturbance of vision  OT Problem List: Decreased strength;Decreased activity tolerance;Impaired balance (sitting and/or standing);Impaired vision/perception;Decreased cognition;Decreased safety awareness;Decreased knowledge of use of DME or AE;Cardiopulmonary status limiting activity OT Treatment Interventions: Self-care/ADL training;Neuromuscular education;DME and/or AE instruction;Therapeutic activities;Cognitive remediation/compensation;Visual/perceptual remediation/compensation;Patient/family education;Balance training   OT Goals(Current goals can be found in the care plan section) Acute Rehab OT Goals Patient Stated Goal: To eat some applesauce OT Goal Formulation: With patient Time For Goal Achievement: 08/09/13 Potential to Achieve Goals: Good ADL Goals Pt Will Perform Grooming: with supervision;standing Pt Will Perform Upper Body Bathing: with set-up;sitting Pt Will Perform Lower Body Bathing: with supervision;sit to/from  stand Pt Will Perform Upper Body Dressing: with supervision;sitting Pt Will Perform Lower Body Dressing: with supervision;sit to/from stand Pt Will Transfer to Toilet: with supervision;ambulating;regular height toilet;grab bars Pt Will Perform  Toileting - Clothing Manipulation and hygiene: with supervision;sit to/from stand Additional ADL Goal #1: Pt will locate items and navigate unit using signage with min verbal cues  Visit Information  Last OT Received On: 07/26/13 Assistance Needed: +1 History of Present Illness: Angelica Mcbride is an 73 y.o. female presenting to hospital with acute congestive heart failure, hypernatremia, hypertensive urgency requiring Nitro drip. Patient was admitted for her worsening COPD. On hospital day 2 she was noted to have worsening lethargy and confusion. Patient was placed on BIPAP for worsening hypercapnia most recent pCO2 of 86 and 85. CT head was obtained showing a 4 mm high-density focus in the lower left pons which could represent hemorrhagic infarct. 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         Prior Functioning     Home Living Family/patient expects to be discharged to:: Private residence Living Arrangements: Children Available Help at Discharge: Family;Available 24 hours/day;Available PRN/intermittently (uncertain) Type of Home: Apartment Home Access: Other (comment) (uncertain - pt unable to state) Home Layout: Other (Comment) (uncertain) Additional Comments: No family present.  Pt able to provide some info, but gets confused with answers  Prior Function Level of Independence: Independent Comments: per pt report.  No caregiver present to confirm Communication Communication: Other (comment) (perseveration noted) Dominant Hand: Right         Vision/Perception Vision - History Baseline Vision: Wears glasses all the time Visual History: Cataracts Patient Visual Report: No change from baseline Vision -  Assessment Vision Assessment: Vision tested Ocular Range of Motion: Within Functional Limits Additional Comments: Full visual assesement difficult at this time due to cognitive deficits and pt with difficulty sustaining attention.  She is able to locate the mute button on her remote, read therapist's nametag and clock on wall.     Cognition  Cognition Arousal/Alertness: Awake/alert;Lethargic (initially lethargic) Behavior During Therapy: Flat affect Overall Cognitive Status: Impaired/Different from baseline Orientation Level: Time Current Attention Level: Sustained Memory: Decreased recall of precautions;Decreased short-term memory Following Commands: Follows one step commands consistently Awareness: Intellectual Problem Solving: Slow processing;Decreased initiation;Requires verbal cues;Requires tactile cues    Extremity/Trunk Assessment Upper Extremity Assessment Upper Extremity Assessment: Generalized weakness Lower Extremity Assessment Lower Extremity Assessment: Defer to PT evaluation Cervical / Trunk Assessment Cervical / Trunk Assessment: Normal     Mobility Bed Mobility Overal bed mobility: Needs Assistance Bed Mobility: Supine to Sit Supine to sit: Min assist General bed mobility comments: assist to initiate LE movement and assist to lift shoulders off bed Transfers Overall transfer level: Needs assistance Equipment used: None Transfers: Sit to/from [employer] Sit to Stand: Min assist Stand pivot transfers: Min assist General transfer comment: Pt unsteady requiring min A for balance     Exercise     Balance Balance Overall balance assessment: Needs assistance Sitting-balance support: Feet supported;Single extremity supported Sitting balance-Leahy Scale: Fair Postural control: Posterior lean Standing balance support: No upper extremity supported Standing balance-Leahy Scale: Poor   End of Session OT - End of Session Equipment Utilized During  Treatment: Oxygen (2L) Activity Tolerance: Patient tolerated treatment well Patient left: in chair;with call bell/phone within reach;with chair alarm set Nurse Communication: Mobility status;Other (comment) (BP)  GO     Doreatha Offer M 07/26/2013, 12:05 PM

## 2013-07-26 NOTE — Progress Notes (Signed)
[place] [facility]  CONSULT SERVICE NOTE  Pt in a test. Will return in the am tomorrow  Ranelle OysterZachary T. Swartz, MD, [facility]

## 2013-07-26 NOTE — Progress Notes (Signed)
Family Medicine Teaching Service Daily Progress Note Intern Pager: [phone]  Patient name: Angelica RamsayJoan Mcbride Medical record number: [id number] Date of birth: 1940/11/16 Age: 73 y.o. Gender: female  Primary Care Provider: No PCP Per Patient Consultants: CCM, neurology Code Status: Full  Pt Overview and Major Events to Date:  1/5: Admitted for acute CHF, lasix 40mg  IV TID 1/6: Off NTG gtt, losartan and norvasc started 1/7: Down nearly 20 lbs from admission. Decrease lasix to 40mg  IV daily 1/8: MRI shows pontine hemmorhage with infarct and multiple bilateral likely hypertensive infarcts 1/10: Continue waxing and waning encephalopathy, Repeat CT improved hemorrhage 1/12: Therapy evaluations, disposition pending  Assessment and Plan: Angelica RamsayJoan Morrissette is a 73 y.o. year old female presenting with acute congestive heart failure with the background of likely COPD and HTN.   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  - Goal BP < 170mmHg given improved mentation with higher BP and improving hemorrhage - Anticoagulants held; on ASA 81mg  per neurology for secondary prevention - Carotid dopplers unremarkable, risk labs negative - Consult SLP (eval deferred to today), PT, OT; consider palliative care consult  Malignant Hypertension: Likely cause of pontine and other remote infarcts. - PRN hydralazine reliably given, so will order hydralazine 10mg  IV q4h AND 10mg  q4h prn - Consider ARB (Losartan 50) pending SLP evaluation; consider adding Amlodipine 5mg  if continues to be above goal and needing hydralzine  Acute CHF: preserved EF: History of worsening dyspnea in setting of no medical care. BNP: 11566.0, CXR with pulmonary congestion and cardiac enlargement. - 2D ECHO: EF 55%, likely diastolic dysfunction with moderate LVH, pulmonary HTN.  - Lasix  stopped for hypernatremia  Hypernatremia: likely iatrogenic hypernatremia; 155 > 151 - d/c daily lasix - s/p 1L 1/2 NS, now D5W  Acute respiratory failure: Presumptive COPD dx with 50 pack-year hx, 40 year hx chronic cough. Bronchitic changes on CXR. Chronic, compensated respiratory acidosis and hypoxia on ABG. - O2 prn - Avoiding beta-blocker - Duonebs prn wheezing - ABG 11/10 with PCO2 decreased to 60  Paroxysmal ventricular tachycardia Noted 2 runs 1/7, given Mag. No symptoms. Resolved 1/8  Hypomagnesemia Resolved 1/8 s/p repletion  Anemia + thrombocytopenia, mild, normocytic: hgb 11.0, continue to monitor.   Pulmonary Nodule: 4.285mmg Nodule in R middle lobes, needs follow CT in 6 months  FEN/GI: Heart healthy diet, Strict I/O, Saline Lock IV  Prophylaxis: SCDs, anti-coagulants stopped for brain bleed  Disposition: Continue respiratory management and close monitoring of mental status in SDU. At this point she is multifactorial encephalopathy. Possible etiologies attributing include but not limited to relative hypotension, ICU delirium, acute hemorrhage, or infectious etiology.  Subjective: Pt poorly arousable, oriented to person and place not time or situation. Denies pain. Stable exam per RN over weekend. Daughter not at bedside.    Objective: Temp:  [98.3 F (36.8 C)-99.6 F (37.6 C)] 98.6 F (37 C) (01/12 0800) Pulse Rate:  [73-104] 82 (01/12 0900) Resp:  [18-22] 20 (01/12 0400) BP: (135-196)/(54-86) 182/62 mmHg (01/12 0900) SpO2:  [89 %-100 %] 100 % (01/12 0900) Weight:  [117 lb 1 oz (53.1 kg)] 117 lb 1 oz (53.1 kg) (01/12 0600)  Physical Exam: Gen: Thin, elderly somnolent female able to follow some instructions in NAD.  + [place] HEENT: NCAT, EOMI, MMM, supple neck with full range of motion. CV: RRR, no murmur  Resp: Overall  diminished breath sounds, normal work of breathing, no wheezes noted  Abd: SNTND, BS present, no guarding or organomegaly  Ext: no edema,  warm, no tenderness in bilateral legs.  Neuro: Poorly arousable, oriented x2 to person, place (hospital); no lateralization of extremities.  Able to sit in bed unassisted  Laboratory:  Recent Labs Lab 07/24/13 1315 07/25/13 0232 07/26/13 0700  WBC 15.8* 11.7* 11.9*  HGB 13.0 13.0 13.7  HCT 40.1 40.9 42.7  PLT 114* 116* 128*    Recent Labs Lab 07/25/13 0232 07/25/13 1830 07/26/13 0700  NA 154* 155* 151*  K 3.5* 3.6* 4.1  CL 98 98 99  CO2 44* 37* 36*  BUN 33* 36* 34*  CREATININE 1.04 1.01 0.93  CALCIUM 9.2 8.7 8.6  PROT  --   --  7.0  BILITOT  --   --  0.5  ALKPHOS  --   --  47  ALT  --   --  16  AST  --   --  33  GLUCOSE 99 95 87    Recent Labs Lab 07/19/13 1347 07/20/13 2104 07/21/13 1219 07/21/13 1709 07/24/13 1110  PHART 7.302* 7.412 7.340* 7.349* 7.481*  PCO2ART 63.9* 71.4* 86.3* 85.4* 60.5*  PO2ART 97.0 49.0* 126.0* 79.0* 80.0  HCO3 31.6* 45.3* 46.5* 47.1* 45.1*  TCO2 33 47 49 50 47  O2SAT 96.0 81.0 98.0 94.0 96.0     Recent Labs  07/20/13 0020 07/20/13 0730  HGBA1C 6.3*  --   TRIG  --  62  CHOL  --  132  HDL  --  40  LDLCALC  --  80  TSH 0.657  --     Recent Labs Lab 07/19/13 1310  07/20/13 0730 07/21/13 1904 07/22/13 0452  TROPONINI <0.30  < > <0.30 <0.30 <0.30  PROBNP 11566.0*  --   --   --   --   < > = values in this interval not displayed.  07/19/13 - UA - 100 protein, neg UTI   Imaging/Diagnostic Tests: 1/5 1V CXR - Vascular Congestion, + Bronchitis 1/7 1V CXR - Improving congestion 1/8 CT Head w/o contrast - 4mm L pons hemorrhage; extensive small-vessel ischemic changes with remote infarcts 1/8 MRI Head w/o contrast - 5mm L pons hemorrhage; 3mm L pons acute infarct; remote B basal ganglia, thalamic, cerebellar infarcts; moderate microvascular ischemia 1/9 - CTA Chest - neg for PE, + nodule, mild emphysema; small B pleural effusions; small pericardial effusion  ECHO 1/6: + LVH, EF 55% poorly visualized wall motion. PA peak  pressure . Small pericardial effusion Carotid doppler study: 1-39% B; some tortuosity with ? 40-50% at L mid ICA   Hazeline Junker, MD 07/26/2013, 9:13 AM PGY-1,  Family Medicine FPTS Intern pager: [phone], text pages welcome

## 2013-07-26 NOTE — Evaluation (Signed)
Clinical/Bedside Swallow Evaluation Patient Details  Name: Angelica Mcbride Sieh MRN: [id number] Date of Birth: 09-28-1940  Today's Date: 07/26/2013 Time: 1015-1035 SLP Time Calculation (min): 20 min  Past Medical History: History reviewed. No pertinent past medical history. Past Surgical History: History reviewed. No pertinent past surgical history. HPI:  Angelica Mcbride Ciaramitaro is an 73 y.o. female presenting to hospital with acute congestive heart failure, hypernatremia, hypertensive urgency requiring Nitro drip. Patient was admitted for her worsening COPD. On hospital day 2 she was noted to have worsening lethargy and confusion. Patient was placed on BIPAP for worsening hypercapnia. 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    Assessment / Plan / Recommendation Clinical Impression  Bedside swallow evaluation complete. Overall, patient presents with a functional appearing oropharyngeal swallow with intact oral phase, timely appearing swallow initiation and clear vocal quality post swallow. Min cueing provided for slow rate of intake due to impulsivity.  Patient does present with a delayed congested cough and in light of location of CVA, recommend proceeding with MBS to objectively evaluate for silent aspiration and determine least restrictive diet. Will plan for MBS this pm at 1330.     Aspiration Risk  Moderate    Diet Recommendation NPO;NPO except meds   Medication Administration: Whole meds with puree    Other  Recommendations Recommended Consults: MBS Oral Care Recommendations: Oral care Q4 per protocol   Follow Up Recommendations  Inpatient Rehab    Frequency and Duration        Pertinent Vitals/Pain n/a        Swallow Study    General HPI: Angelica Mcbride Dangerfield is an 73 y.o. female presenting to hospital with acute  congestive heart failure, hypernatremia, hypertensive urgency requiring Nitro drip. Patient was admitted for her worsening COPD. On hospital day 2 she was noted to have worsening lethargy and confusion. Patient was placed on BIPAP for worsening hypercapnia. 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  Type of Study: Bedside swallow evaluation Previous Swallow Assessment: none Diet Prior to this Study: NPO Temperature Spikes Noted: No Respiratory Status: Room air History of Recent Intubation: No Behavior/Cognition: Alert;Cooperative;Pleasant mood;Confused Oral Cavity - Dentition: Poor condition;Missing dentition Self-Feeding Abilities: Able to feed self Patient Positioning: Upright in bed Baseline Vocal Quality: Clear Volitional Cough: Strong;Congested Volitional Swallow: Able to elicit    Oral/Motor/Sensory Function Overall Oral Motor/Sensory Function: Appears within functional limits for tasks assessed   Ice Chips Ice chips: Within functional limits Presentation: Spoon   Thin Liquid Thin Liquid: Impaired Presentation: Cup;Self Fed Pharyngeal  Phase Impairments: Cough - Delayed    Nectar Thick Nectar Thick Liquid: Not tested   Honey Thick Honey Thick Liquid: Not tested   Puree Puree: Impaired Presentation: Spoon Pharyngeal Phase Impairments: Cough - Delayed   Solid   GO   Tykera Skates MA, CCC-SLP [phone]  Solid: Not tested       Malak Duchesneau Meryl 07/26/2013,10:42 AM

## 2013-07-26 NOTE — Procedures (Signed)
Objective Swallowing Evaluation: Modified Barium Swallowing Study  Patient Details  Name: Angelica Mcbride MRN: [id number] Date of Birth: 14-Nov-1940  Today's Date: 07/26/2013 Time: 1345-1410 SLP Time Calculation (min): 25 min  Past Medical History: History reviewed. No pertinent past medical history. Past Surgical History: History reviewed. No pertinent past surgical history. HPI:  Angelica Mcbride is an 73 y.o. female presenting to hospital with acute congestive heart failure, hypernatremia, hypertensive urgency requiring Nitro drip. Patient was admitted for her worsening COPD. On hospital day 2 she was noted to have worsening lethargy and confusion. Patient was placed on BIPAP for worsening hypercapnia. 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      Assessment / Plan / Recommendation Clinical Impression  Dysphagia Diagnosis: Mild pharyngeal phase dysphagia;Mild oral phase dysphagia Clinical impression: Patient presents with a mild oropharyngeal dysphagia with primarily cognitive origin. Delayed oral transit time and swallow initiation noted related to AMS/lethargy resulting in intermittent and trace penetration of thin liquids which decreased overall in frequency and degree with SLP cueing for increased level of alertness. No aspiration noted. Pharyngeal strength and oral clearance good. Recommend initiation of a regular diet, thin liquids, no straws to faciliate increased safety of swallow, meds whole in puree. SLP will f/u briefly to ensure diet tolerance. Recommend cognitive-linguistic evaluation as well due to notable cognitive deficits.     Treatment Recommendation  Therapy as outlined in treatment plan below    Diet Recommendation Regular;Thin liquid   Liquid Administration via: Cup;Straw Medication Administration:  Whole meds with puree Supervision: Patient able to self feed;Full supervision/cueing for compensatory strategies Compensations: Slow rate;Small sips/bites Postural Changes and/or Swallow Maneuvers: Seated upright 90 degrees    Other  Recommendations Oral Care Recommendations: Oral care BID   Follow Up Recommendations  Inpatient Rehab    Frequency and Duration min 2x/week  1 week           General HPI: Angelica Mcbride is an 73 y.o. female presenting to hospital with acute congestive heart failure, hypernatremia, hypertensive urgency requiring Nitro drip. Patient was admitted for her worsening COPD. On hospital day 2 she was noted to have worsening lethargy and confusion. Patient was placed on BIPAP for worsening hypercapnia. 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  Type of Study: Modified Barium Swallowing Study Reason for Referral: Objectively evaluate swallowing function Previous Swallow Assessment: see bedside swallow eval 1/12 Diet Prior to this Study: NPO (except meds) Temperature Spikes Noted: No Respiratory Status: Room air History of Recent Intubation: No Behavior/Cognition: Alert;Cooperative;Pleasant mood;Confused Oral Cavity - Dentition: Poor condition;Missing dentition Oral Motor / Sensory Function: Within functional limits Self-Feeding Abilities: Able to feed self Patient Positioning: Upright in chair Baseline Vocal Quality: Clear Volitional Cough: Strong;Congested Volitional Swallow: Able to elicit Anatomy: Within functional limits Pharyngeal Secretions: Not observed secondary MBS    Reason for Referral Objectively evaluate swallowing function   Oral Phase Oral Preparation/Oral Phase Oral Phase: Impaired Oral - Thin Oral - Thin Cup: Delayed oral transit Oral - Thin Straw: Delayed oral  transit Oral - Solids Oral - Puree: Delayed oral transit Oral - Mechanical Soft: Delayed oral transit Oral - Pill: Delayed oral transit Oral Phase - Comment Oral Phase -  Comment: suspect due to AMS/lethargy as improved with cueing to maintain adequate level of alertness   Pharyngeal Phase Pharyngeal Phase Pharyngeal Phase: Impaired Pharyngeal - Thin Pharyngeal - Thin Cup: Premature spillage to valleculae;Premature spillage to pyriform sinuses;Penetration/Aspiration before swallow Penetration/Aspiration details (thin cup): Material enters airway, remains ABOVE vocal cords then ejected out Pharyngeal - Thin Straw: Premature spillage to valleculae;Premature spillage to pyriform sinuses;Delayed swallow initiation;Penetration/Aspiration before swallow Penetration/Aspiration details (thin straw): Material enters airway, remains ABOVE vocal cords and not ejected out (in 1 out of 6-7 trials) Pharyngeal - Solids Pharyngeal - Puree: Delayed swallow initiation;Premature spillage to valleculae Pharyngeal - Mechanical Soft: Delayed swallow initiation;Premature spillage to valleculae  Cervical Esophageal Phase    GO Ferdinand Lango MA, CCC-SLP [phone]    Cervical Esophageal Phase Cervical Esophageal Phase: [facility]         Angelica Mcbride 07/26/2013, 3:33 PM

## 2013-07-26 NOTE — Progress Notes (Signed)
Physical Therapy Treatment Patient Details Name: Angelica Mcbride MRN: [id number] DOB: Jan 28, 1941 Today's Date: 07/26/2013 Time: 1201-1227 PT Time Calculation (min): 26 min  PT Assessment / Plan / Recommendation  History of Present Illness Angelica Mcbride is an 73 y.o. female presenting to hospital with acute congestive heart failure, hypernatremia, hypertensive urgency requiring Nitro drip. Patient was admitted for her worsening COPD. On hospital day 2 she was noted to have worsening lethargy and confusion. Patient was placed on BIPAP for worsening hypercapnia most recent pCO2 of 86 and 85. CT head was obtained showing a 4 mm high-density focus in the lower left pons which could represent hemorrhagic infarct. 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     PT Comments   Pt very pleasant and improving with mobility. Pt initially lethargic on arrival and required stimulation to open eyes and respond to question and much more alert on her feet. Pt with decreased STM and perseverating on how she had a CVA, "Angelica Mcbride", and her room number. Pt with STM of grossly 2 min before she asks the same question again. Pt states she enjoys playing the piano and is doing well with recalling distant memory. Pt with assist to don underwear in standing as unable with bil UE support and cueing to dual task. Will continue to follow to maximize balance, safety and cognition.   Follow Up Recommendations  CIR     Does the patient have the potential to tolerate intense rehabilitation     Barriers to Discharge        Equipment Recommendations       Recommendations for Other Services    Frequency     Progress towards PT Goals Progress towards PT goals: Progressing toward goals  Plan Current plan remains appropriate    Precautions / Restrictions Precautions Precautions: Fall Restrictions Weight Bearing Restrictions: No   Pertinent Vitals/Pain No pain HR 91-93 sats 90-96% on RA BP  189/78 sitting initially, 177/96 standing, 155/64 in chair end of session    Mobility  Bed Mobility Overal bed mobility: Needs Assistance Bed Mobility: Supine to Sit Supine to sit: Min assist General bed mobility comments: assist to initiate LE movement and assist to lift shoulders off bed Transfers Overall transfer level: Needs assistance Equipment used: None Transfers: Sit to/from Stand Sit to Stand: Min assist Stand pivot transfers: Min assist General transfer comment: min assist with sit to stand and cues for hand placement Ambulation/Gait Ambulation/Gait assistance: Min assist Ambulation Distance (Feet): 120 Feet Assistive device: Rolling walker (2 wheeled);None Gait Pattern/deviations: Step-through pattern;Decreased stride length;Shuffle Gait velocity interpretation: <1.8 ft/sec, indicative of risk for recurrent falls General Gait Details: pt with shuffling gait with minguard for gait with use of RW and min assist without RW for balance due to unsteady gait. Pt required directional cues as well    Exercises     PT Diagnosis:    PT Problem List:   PT Treatment Interventions:     PT Goals (current goals can now be found in the care plan section) Acute Rehab PT Goals Patient Stated Goal: To eat some applesauce  Visit Information  Last PT Received On: 07/26/13 Assistance Needed: +1 History of Present Illness: Angelica Mcbride is an 73 y.o. female presenting to hospital with acute congestive heart failure, hypernatremia, hypertensive urgency requiring Nitro drip. Patient was admitted for her worsening COPD. On hospital day 2 she was noted to have worsening lethargy and confusion. Patient was placed on BIPAP for worsening hypercapnia most  recent pCO2 of 86 and 85. CT head was obtained showing a 4 mm high-density focus in the lower left pons which could represent hemorrhagic infarct. 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      Subjective Data   Patient Stated Goal: To eat some applesauce   Cognition  Cognition Arousal/Alertness: Awake/alert Behavior During Therapy: Flat affect Overall Cognitive Status: Impaired/Different from baseline Orientation Level: Disoriented to;Situation Current Attention Level: Sustained Memory: Decreased recall of precautions;Decreased short-term memory Following Commands: Follows one step commands consistently Awareness: Intellectual Problem Solving: Slow processing;Decreased initiation;Requires verbal cues;Requires tactile cues    Balance  Balance Overall balance assessment: Needs assistance Sitting-balance support: Feet supported;Single extremity supported Sitting balance-Leahy Scale: Fair Postural control: Posterior lean Standing balance support: Bilateral upper extremity supported Standing balance-Leahy Scale: Poor  End of Session PT - End of Session Equipment Utilized During Treatment: Gait belt Activity Tolerance: Patient tolerated treatment well Patient left: in chair;with call bell/phone within reach;with chair alarm set Nurse Communication: Mobility status   GP     Angelica Mcbride, Angelica Mcbride 07/26/2013, 12:32 PM Angelica Mcbride Angelica Mcbride, PT [phone]

## 2013-07-26 NOTE — Consult Note (Signed)
Physical Medicine and Rehabilitation Consult Reason for Consult: Deconditioning/cephalopathy Referring Physician: Family medicine   HPI: Angelica Mcbride is a 73 y.o. right-handed female on no scheduled medications prior to admission. Presented 07/19/2013 with increasing shortness of breath. Chest x-ray compatible with acute congestive heart failure. Placed on intravenous Lasix. Cardiac enzymes negative. Latest weight nearly down 20 pounds from admission. Carotid Dopplers with no ICA stenosis. Echocardiogram with ejection fraction of 55% no wall motion abnormalities. There is a small to moderate pericardial effusion identified. Neurology services consulted 07/22/2013 secondary to g and confusion. CT of the head as well as MRI shows remote infarct. It was the feeling of neurology services the clinical presentation not compatible with acute infarct and presentation compatible with encephalopathy from CO2 narcosis and hypertensive urgency. Patient is currently n.p.o. with plan modified Graham swallow 07/26/2013. Physical therapy evaluation completed 07/24/2013 at the recommendations of physical medicine rehabilitation consult to consider inpatient rehabilitation services.   Review of Systems  Respiratory: Positive for shortness of breath.   Musculoskeletal: Positive for myalgias.  All other systems reviewed and are negative.   History reviewed. No pertinent past medical history. History reviewed. No pertinent past surgical history. Family History  Problem Relation Age of Onset  . Diabetes Daughter    Social History:  reports that she has been smoking Cigarettes.  She has a 51 pack-year smoking history. She does not have any smokeless tobacco history on file. She reports that she does not drink alcohol. Her drug history is not on file. Allergies: No Known Allergies Medications Prior to Admission  Medication Sig Dispense Refill  . acetaminophen (TYLENOL) 500 MG tablet Take 500 mg by mouth every 8  (eight) hours as needed for mild pain.      Marland Kitchen EPINEPHrine Base (PRIMATENE MIST IN) Inhale 1-2 puffs into the lungs every 8 (eight) hours as needed (for congestion).      Marland Kitchen guaiFENesin (MUCINEX) 600 MG 12 hr tablet Take 600 mg by mouth 2 (two) times daily as needed for to loosen phlegm.        Home: Home Living Family/patient expects to be discharged to:: Private residence Living Arrangements: Children Additional Comments: No caregivers present and pt with minimal verbalization and unable to answer questions at this time.   Functional History: Prior Function Comments: No caregivers present and pt with minimal verbalization and unable to answer questions at this time. Functional Status:  Mobility:          ADL:    Cognition: Cognition Overall Cognitive Status: Impaired/Different from baseline Orientation Level: Oriented to person Cognition Arousal/Alertness: Lethargic (able to wake up when sitting EOB but quickly returns to slee) Behavior During Therapy: Restless Overall Cognitive Status: Impaired/Different from baseline Area of Impairment: Orientation;Following commands;Awareness;Problem solving;Attention Orientation Level: Disoriented to;Person;Time;Situation Current Attention Level: Focused Following Commands: Follows one step commands inconsistently Awareness: Intellectual Problem Solving: Slow processing;Difficulty sequencing  Blood pressure 182/62, pulse 82, temperature 98.6 F (37 C), temperature source Oral, resp. rate 20, height 5\' 7"  (1.702 m), weight 53.1 kg (117 lb 1 oz), SpO2 100.00%. Physical Exam  Vitals reviewed. HENT:  Head: Normocephalic.  Eyes: EOM are normal.  Neck: Normal range of motion. Neck supple. No thyromegaly present.  Cardiovascular: Normal rate and regular rhythm.   Respiratory:  Decreased breath sounds at the bases and clear to auscultation  GI: Soft. Bowel sounds are normal. She exhibits no distension.  Neurological:  Patient is alert  with flat affect. She made good eye contact with examiner. She was  able to state her name, age and date of birth. [facility]. Very distracted and tangential.  She follows simple commands. Moves all 4's, no gross sensory deficits. Strength 3+ prox to 4 distally.   Skin: Skin is warm and dry.    Results for orders placed during the hospital encounter of 07/19/13 (from the past 24 hour(s))  BASIC METABOLIC PANEL     Status: Abnormal   Collection Time    07/25/13  6:30 PM      Result Value Range   Sodium 155 (*) 137 - 147 mEq/L   Potassium 3.6 (*) 3.7 - 5.3 mEq/L   Chloride 98  96 - 112 mEq/L   CO2 37 (*) 19 - 32 mEq/L   Glucose, Bld 95  70 - 99 mg/dL   BUN 36 (*) 6 - 23 mg/dL   Creatinine, Ser 3.661.01  0.50 - 1.10 mg/dL   Calcium 8.7  8.4 - 44.010.5 mg/dL   GFR calc non Af Amer 55 (*) >90 mL/min   GFR calc Af Amer 63 (*) >90 mL/min  COMPREHENSIVE METABOLIC PANEL     Status: Abnormal   Collection Time    07/26/13  7:00 AM      Result Value Range   Sodium 151 (*) 137 - 147 mEq/L   Potassium 4.1  3.7 - 5.3 mEq/L   Chloride 99  96 - 112 mEq/L   CO2 36 (*) 19 - 32 mEq/L   Glucose, Bld 87  70 - 99 mg/dL   BUN 34 (*) 6 - 23 mg/dL   Creatinine, Ser 3.470.93  0.50 - 1.10 mg/dL   Calcium 8.6  8.4 - 42.510.5 mg/dL   Total Protein 7.0  6.0 - 8.3 g/dL   Albumin 2.7 (*) 3.5 - 5.2 g/dL   AST 33  0 - 37 U/L   ALT 16  0 - 35 U/L   Alkaline Phosphatase 47  39 - 117 U/L   Total Bilirubin 0.5  0.3 - 1.2 mg/dL   GFR calc non Af Amer 60 (*) >90 mL/min   GFR calc Af Amer 70 (*) >90 mL/min  CBC     Status: Abnormal   Collection Time    07/26/13  7:00 AM      Result Value Range   WBC 11.9 (*) 4.0 - 10.5 K/uL   RBC 5.33 (*) 3.87 - 5.11 MIL/uL   Hemoglobin 13.7  12.0 - 15.0 g/dL   HCT 95.642.7  38.736.0 - 56.446.0 %   MCV 80.1  78.0 - 100.0 fL   MCH 25.7 (*) 26.0 - 34.0 pg   MCHC 32.1  30.0 - 36.0 g/dL   RDW 33.217.4 (*) 95.111.5 - 88.415.5 %   Platelets 128 (*) 150 - 400 K/uL   Ct Head Wo  Contrast  07/24/2013   CLINICAL DATA:  Followup pontine lesion, possibly hemorrhage. Worsening lethargy and confusion.  EXAM: CT HEAD WITHOUT CONTRAST  TECHNIQUE: Contiguous axial images were obtained from the base of the skull through the vertex without intravenous contrast.  COMPARISON:  Head CT 07/22/2013.  MRI 07/22/2013.  FINDINGS: 3-4 mm focus of hyperdensity previously seen in the left side of the pons is no longer discernible. Therefore, this probably represented a small hemorrhage. No evidence of additional bleeding. Chronic microvascular changes remain evident throughout the pons and cerebellum. The cerebral hemispheres again show an old lacunar infarction in the right thalamus with old small vessel infarctions within the basal  ganglia and throughout the hemispheric deep white matter. No sign of acute infarction. No hydrocephalus or extra-axial collection.  IMPRESSION: Extensive chronic small vessel ischemic changes throughout the brain.  The previously seen 3 mm focus of high density in the left pons is no longer visible. If this represented a small hemorrhage, it has spread out/become less dense such that it is no longer visible. Certainly there is no additional bleeding.   Electronically Signed   By: Paulina Fusi M.D.   On: 07/24/2013 15:27    Assessment/Plan: Diagnosis:   deconditioning, encephalopathy, ?pontine infarct 1. Does the need for close, 24 hr/day medical supervision in concert with the patient's rehab needs make it unreasonable for this patient to be served in a less intensive setting? Yes 2. Co-Morbidities requiring supervision/potential complications: htn, chf 3. Due to bladder management, bowel management, safety, skin/wound care, disease management, medication administration, pain management and patient education, does the patient require 24 hr/day rehab nursing? Yes 4. Does the patient require coordinated care of a physician, rehab nurse, PT (1-2 hrs/day, 5 days/week), OT (1-2  hrs/day, 5 days/week) and SLP (1-2 hrs/day, 5 days/week) to address physical and functional deficits in the context of the above medical diagnosis(es)? Yes Addressing deficits in the following areas: balance, endurance, locomotion, strength, transferring, bowel/bladder control, bathing, dressing, feeding, grooming, toileting, cognition and psychosocial support 5. Can the patient actively participate in an intensive therapy program of at least 3 hrs of therapy per day at least 5 days per week? Yes 6. The potential for patient to make measurable gains while on inpatient rehab is excellent 7. Anticipated functional outcomes upon discharge from inpatient rehab are supervision to mod I with PT, supervision to mod I with OT, supervision with SLP. 8. Estimated rehab length of stay to reach the above functional goals is: 7-10 days 9. Does the patient have adequate social supports to accommodate these discharge functional goals? Yes 10. Anticipated D/C setting: Home 11. Anticipated post D/C treatments: HH therapy 12. Overall Rehab/Functional Prognosis: excellent  RECOMMENDATIONS: This patient's condition is appropriate for continued rehabilitative care in the following setting: CIR Patient has agreed to participate in recommended program. Yes Note that insurance prior authorization may be required for reimbursement for recommended care.  Comment: Rehab Admissions Coordinator to follow up.  Thanks,  Ranelle Oyster, MD, [place]     07/26/2013

## 2013-07-27 DIAGNOSIS — I10 Essential (primary) hypertension: Secondary | ICD-10-CM | POA: Diagnosis not present

## 2013-07-27 DIAGNOSIS — I509 Heart failure, unspecified: Secondary | ICD-10-CM | POA: Diagnosis not present

## 2013-07-27 DIAGNOSIS — I619 Nontraumatic intracerebral hemorrhage, unspecified: Secondary | ICD-10-CM | POA: Diagnosis not present

## 2013-07-27 DIAGNOSIS — G929 Unspecified toxic encephalopathy: Secondary | ICD-10-CM

## 2013-07-27 DIAGNOSIS — G92 Toxic encephalopathy: Secondary | ICD-10-CM

## 2013-07-27 DIAGNOSIS — G9340 Encephalopathy, unspecified: Secondary | ICD-10-CM | POA: Diagnosis not present

## 2013-07-27 LAB — BASIC METABOLIC PANEL
BUN: 24 mg/dL — ABNORMAL HIGH (ref 6–23)
BUN: 26 mg/dL — ABNORMAL HIGH (ref 6–23)
BUN: 28 mg/dL — ABNORMAL HIGH (ref 6–23)
CALCIUM: 8.5 mg/dL (ref 8.4–10.5)
CHLORIDE: 95 meq/L — AB (ref 96–112)
CO2: 35 mEq/L — ABNORMAL HIGH (ref 19–32)
CO2: 36 mEq/L — ABNORMAL HIGH (ref 19–32)
CO2: 37 mEq/L — ABNORMAL HIGH (ref 19–32)
CREATININE: 1.02 mg/dL (ref 0.50–1.10)
Calcium: 8.3 mg/dL — ABNORMAL LOW (ref 8.4–10.5)
Calcium: 8.6 mg/dL (ref 8.4–10.5)
Chloride: 92 mEq/L — ABNORMAL LOW (ref 96–112)
Chloride: 92 mEq/L — ABNORMAL LOW (ref 96–112)
Creatinine, Ser: 0.83 mg/dL (ref 0.50–1.10)
Creatinine, Ser: 0.86 mg/dL (ref 0.50–1.10)
GFR calc Af Amer: 63 mL/min — ABNORMAL LOW (ref >90)
GFR calc non Af Amer: 54 mL/min — ABNORMAL LOW (ref >90)
GFR calc non Af Amer: 66 mL/min — ABNORMAL LOW (ref >90)
GFR calc non Af Amer: 69 mL/min — ABNORMAL LOW (ref >90)
GFR, EST AFRICAN AMERICAN: 80 mL/min — AB (ref >90)
GFR, EST AFRICAN AMERICAN: 77 mL/min — AB (ref >90)
Glucose, Bld: 123 mg/dL — ABNORMAL HIGH (ref 70–99)
Glucose, Bld: 146 mg/dL — ABNORMAL HIGH (ref 70–99)
Glucose, Bld: 214 mg/dL — ABNORMAL HIGH (ref 70–99)
POTASSIUM: 3.6 meq/L — AB (ref 3.7–5.3)
Potassium: 3.2 mEq/L — ABNORMAL LOW (ref 3.7–5.3)
Potassium: 3.5 mEq/L — ABNORMAL LOW (ref 3.7–5.3)
SODIUM: 142 meq/L (ref 137–147)
Sodium: 139 mEq/L (ref 137–147)
Sodium: 141 mEq/L (ref 137–147)

## 2013-07-27 LAB — CBC
HCT: 37.4 % (ref 36.0–46.0)
HEMOGLOBIN: 12.5 g/dL (ref 12.0–15.0)
MCH: 25.8 pg — ABNORMAL LOW (ref 26.0–34.0)
MCHC: 33.4 g/dL (ref 30.0–36.0)
MCV: 77.3 fL — ABNORMAL LOW (ref 78.0–100.0)
PLATELETS: 143 10*3/uL — AB (ref 150–400)
RBC: 4.84 MIL/uL (ref 3.87–5.11)
RDW: 17.4 % — ABNORMAL HIGH (ref 11.5–15.5)
WBC: 8.7 10*3/uL (ref 4.0–10.5)

## 2013-07-27 LAB — VITAMIN B12: VITAMIN B 12: 370 pg/mL (ref 211–911)

## 2013-07-27 LAB — GLUCOSE, CAPILLARY: GLUCOSE-CAPILLARY: 116 mg/dL — AB (ref 70–99)

## 2013-07-27 LAB — RPR: RPR: NONREACTIVE

## 2013-07-27 MED ORDER — SODIUM CHLORIDE 0.9 % IV SOLN
INTRAVENOUS | Status: DC
  Administered 2013-07-27 (×2): via INTRAVENOUS

## 2013-07-27 MED ORDER — ASPIRIN 81 MG PO CHEW
81.0000 mg | CHEWABLE_TABLET | Freq: Every day | ORAL | Status: DC
  Administered 2013-07-27 – 2013-07-29 (×3): 81 mg via ORAL
  Filled 2013-07-27 (×3): qty 1

## 2013-07-27 MED ORDER — ATORVASTATIN CALCIUM 40 MG PO TABS
40.0000 mg | ORAL_TABLET | Freq: Every day | ORAL | Status: DC
  Administered 2013-07-27 – 2013-07-28 (×2): 40 mg via ORAL
  Filled 2013-07-27 (×3): qty 1

## 2013-07-27 MED ORDER — POTASSIUM CHLORIDE CRYS ER 20 MEQ PO TBCR
20.0000 meq | EXTENDED_RELEASE_TABLET | Freq: Once | ORAL | Status: AC
  Administered 2013-07-27: 20 meq via ORAL
  Filled 2013-07-27: qty 1

## 2013-07-27 MED ORDER — AMLODIPINE BESYLATE 5 MG PO TABS
5.0000 mg | ORAL_TABLET | Freq: Every day | ORAL | Status: DC
  Administered 2013-07-27 – 2013-07-29 (×3): 5 mg via ORAL
  Filled 2013-07-27 (×3): qty 1

## 2013-07-27 NOTE — Progress Notes (Signed)
FMTS Attending Note  I personally saw and evaluated the patient. The plan of care was discussed with the resident team. I agree with the assessment and plan as documented by the resident.   Patient is alert and oriented X3 today, asking for lunch, no acute issues  1. 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 Malignant HTN - resolved, pressures under better control, agree with resident plan 4. Other medical issues stable  Disposition: Discharge to CIR when bed available.   Donnella ShamKyle Bright Spielmann MD

## 2013-07-27 NOTE — Progress Notes (Signed)
Speech Language Pathology Treatment: Dysphagia  Patient Details Name: Angelica Mcbride MRN: [id number] DOB: 17-Mar-1941 Today's Date: 07/27/2013 Time: [id number] SLP Time Calculation (min): 8 min  Assessment / Plan / Recommendation Clinical Impression  F/u after  1/12 MBS.  Dtr, Judeth CornfieldStephanie, present.  Pt reclined in bed, eating lunch with intermittent cough.  Repositioned for safer swallowing and access for self-feeding.  No further coughing noted with consumption of meal;inproved toleration overall.  Reviewed results of MBS and recs with pt and daughter.  Pt with cognitive deficits apparent - inattention, decreased self-regulation, impulsivity - requires min cues during eating to enhance safety.  Dtr asserts definite changes in mentation. Pt potential candidate for CIR - would benefit from cognitive assessment.     HPI HPI: Angelica RamsayJoan Filter is an 73 y.o. female presenting to hospital with acute congestive heart failure, hypernatremia, hypertensive urgency requiring Nitro drip. Patient was admitted for her worsening COPD. On hospital day 2 she was noted to have worsening lethargy and confusion. Patient was placed on BIPAP for worsening hypercapnia. 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       SLP Plan  Continue with current plan of care    Recommendations Diet recommendations: Regular;Thin liquid Liquids provided via: Cup Medication Administration: Whole meds with puree Supervision: Patient able to self feed;Full supervision/cueing for compensatory strategies Compensations: Slow rate;Small sips/bites Postural Changes and/or Swallow Maneuvers: Seated upright 90 degrees       REC SLP cognitive assessment        Plan: Continue with current plan of care   Corisa Montini L. Samson Fredericouture, [place] CCC/SLP Pager [phone]    Blenda MountsCouture,  Kyilee Gregg Laurice 07/27/2013, 1:40 PM

## 2013-07-27 NOTE — Progress Notes (Signed)
Physical Therapy Treatment Patient Details Name: Angelica Mcbride MRN: [id number] DOB: 1940/12/09 Today's Date: 07/27/2013 Time: [id number] PT Time Calculation (min): 38 min  PT Assessment / Plan / Recommendation  History of Present Illness Angelica Mcbride is an 73 y.o. female presenting to hospital with acute congestive heart failure, hypernatremia, hypertensive urgency requiring Nitro drip. Patient was admitted for her worsening COPD. On hospital day 2 she was noted to have worsening lethargy and confusion. Patient was placed on BIPAP for worsening hypercapnia most recent pCO2 of 86 and 85. CT head was obtained showing a 4 mm high-density focus in the lower left pons which could represent hemorrhagic infarct. 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     PT Comments   Pt progressing with cognition and mobility today. Pt with noted improvement in memory and able to state why she is in the hospital and recalls parts of the day yesterday. Pt encouraged to continue HEP and mobility with nursing. Continue to recommend CIR prior to home. Pt fatigued with activity today and required seated rest breaks after gait and denied further balance activities due to fatigue.   Follow Up Recommendations  CIR     Does the patient have the potential to tolerate intense rehabilitation     Barriers to Discharge        Equipment Recommendations       Recommendations for Other Services    Frequency     Progress towards PT Goals Progress towards PT goals: Progressing toward goals  Plan Current plan remains appropriate    Precautions / Restrictions Precautions Precautions: Fall   Pertinent Vitals/Pain 99% RA 122/62 with activity    Mobility  Bed Mobility Overal bed mobility: Modified Independent Transfers Sit to Stand: Min guard Stand pivot transfers: Min guard General transfer comment: cueing for hand placement and safety with cues to scoot to edge of surface  first Ambulation/Gait Ambulation/Gait assistance: Min assist Ambulation Distance (Feet): 100 Feet Assistive device: None Gait Pattern/deviations: Step-through pattern;Decreased stride length Gait velocity interpretation: <1.8 ft/sec, indicative of risk for recurrent falls General Gait Details: pt with unsteady gait without use of RW today and repeatedly reaching out for handrail Modified Rankin (Stroke Patients Only) Pre-Morbid Rankin Score: No symptoms Modified Rankin: Moderately severe disability    Exercises General Exercises - Lower Extremity Hip Flexion/Marching: AROM;Seated;15 reps;Both Toe Raises: AROM;Seated;Both;20 reps   PT Diagnosis:    PT Problem List:   PT Treatment Interventions:     PT Goals (current goals can now be found in the care plan section)    Visit Information  Last PT Received On: 07/27/13 Assistance Needed: +1 History of Present Illness: Angelica Mcbride is an 73 y.o. female presenting to hospital with acute congestive heart failure, hypernatremia, hypertensive urgency requiring Nitro drip. Patient was admitted for her worsening COPD. On hospital day 2 she was noted to have worsening lethargy and confusion. Patient was placed on BIPAP for worsening hypercapnia most recent pCO2 of 86 and 85. CT head was obtained showing a 4 mm high-density focus in the lower left pons which could represent hemorrhagic infarct. 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      Subjective Data      Cognition  Cognition Arousal/Alertness: Awake/alert Behavior During Therapy: Flat affect Overall Cognitive Status: Impaired/Different from baseline Area of Impairment: Memory;Safety/judgement Orientation Level: Time;Disoriented to Current Attention Level: Sustained Following Commands: Follows one step commands consistently    Balance  Balance Sitting-balance support: No upper  extremity supported;Feet supported Sitting balance-Leahy Scale:  Good Standing balance support: No upper extremity supported Standing balance-Leahy Scale: Fair General Comments General comments (skin integrity, edema, etc.): Pt able to complete Romberg eyes open for one minute and was able to recall 3 words after 4 min today. pt without noted perseveration today and continues to have difficulty with dual tasks such as HEP and counting at the same time  End of Session PT - End of Session Equipment Utilized During Treatment: Gait belt Activity Tolerance: Patient tolerated treatment well Patient left: in chair;with call bell/phone within reach;with family/visitor present Nurse Communication: Mobility status   GP     Delorse Lek 07/27/2013, 2:37 PM Delaney Meigs, PT [phone]

## 2013-07-27 NOTE — Progress Notes (Signed)
Family Medicine Teaching Service Daily Progress Note Intern Pager: [phone]  Patient name: Angelica Mcbride Medical record number: [id number] Date of birth: 1941-02-15 Age: 73 y.o. Gender: female  Primary Care Provider: No PCP Per Patient Consultants: CCM, neurology Code Status: Full  Pt Overview and Major Events to Date:  1/5: Admitted for acute CHF, lasix 40mg  IV TID 1/6: Off NTG gtt, losartan and norvasc started 1/7: Down nearly 20 lbs from admission. Decrease lasix to 40mg  IV daily 1/8: MRI shows pontine hemmorhage with infarct and multiple bilateral likely hypertensive infarcts 1/10: Continue waxing and waning encephalopathy, Repeat CT improved hemorrhage 1/12: Therapy evaluations, disposition pending 1/13 Transfer to floor, CIR to assess  Assessment and Plan: Angelica Mcbride is a 73 y.o. year old female presenting with acute congestive heart failure with the background of likely COPD and HTN.   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  - ASA 81mg  for secondary stroke prevention - Work up negative - CIR to evaluate for rehab, as recommended by PT and OT today  Malignant Hypertension: Likely cause of pontine and other remote infarcts. - Goal BP < given improved mentation with higher BP and improving hemorrhage - PRN hydralazine reliably given, so will order hydralazine 10mg  IV q4h AND 10mg  q4h prn - On ARB (losartan 50mg )  Acute CHF: Resolved; hovering around 20-25lbs below admission weight, now off lasix - 2D ECHO: EF 55%, likely diastolic dysfunction with moderate LVH, pulmonary HTN.   Hypernatremia: Resolved 1/12, likely iatrogenic. - d/c  daily lasix - Continue to monitor  Acute respiratory failure: Resolved.  COPD: Presumptive dx with 50 pack-year hx, 40 year hx chronic cough. Bronchitic changes on CXR. Chronic, compensated respiratory acidosis and hypoxia on ABG. - O2 prn - Duonebs prn wheezing  Paroxysmal ventricular tachycardia Noted 2 runs 1/7, given Mag. No symptoms. Resolved 1/8  Hypomagnesemia Resolved 1/8 s/p repletion  Anemia + thrombocytopenia, mild, normocytic: Stable, continue to monitor.   Pulmonary Nodule: 4.3mmg Nodule in R middle lobes, needs follow CT in 6 months  FEN/GI: Heart healthy diet, Strict I/O, Saline Lock IV  Prophylaxis: SCDs  Disposition: Consider transfer to telemetry floor  Subjective: Pt alert, oriented x3. Conversive. Denies pain or trouble breathing.   Objective: Temp:  [97.8 F (36.6 C)-99.1 F (37.3 C)] 98.7 F (37.1 C) (01/13 0734) Pulse Rate:  [72-106] 106 (01/13 0734) Resp:  [20] 20 (01/13 0734) BP: (128-213)/(59-94) 151/84 mmHg (01/13 0734) SpO2:  [89 %-100 %] 96 % (01/13 0734) Weight:  [119 lb 11.2 oz (54.296 kg)] 119 lb 11.2 oz (54.296 kg) (01/13 0145)  Physical Exam: Gen: Thin, elderly somnolent female able to follow instructions in NAD. HEENT: NCAT, EOMI, MMM, supple neck with full range of motion. CV: RRR, no murmur  Resp: Overall diminished breath sounds, normal work of breathing, no wheezes noted  Abd: SNTND, BS present, no guarding or organomegaly  Ext: no edema, warm, no tenderness in bilateral legs.  Neuro: A&O x3. MAE. Speech delayed but normal.  Laboratory:  Recent Labs Lab 07/25/13 0232 07/26/13 0700 07/27/13 0302  WBC 11.7* 11.9* 8.7  HGB 13.0 13.7 12.5  HCT 40.9 42.7 37.4  PLT 116* 128* 143*    Recent Labs Lab 07/25/13 1830 07/26/13 0700  07/26/13 2335 07/27/13 0302  NA 155* 151* 141 139  K 3.6* 4.1 3.2* 3.6*  CL 98 99 92* 92*  CO2 37* 36* 35* 36*  BUN 36* 34* 28* 26*  CREATININE 1.01 0.93 0.86 0.83  CALCIUM 8.7 8.6 8.6 8.5   PROT  --  7.0  --   --   BILITOT  --  0.5  --   --   ALKPHOS  --  47  --   --   ALT  --  16  --   --   AST  --  33  --   --   GLUCOSE 95 87 214* 123*    Recent Labs Lab 07/20/13 2104 07/21/13 1219 07/21/13 1709 07/24/13 1110  PHART 7.412 7.340* 7.349* 7.481*  PCO2ART 71.4* 86.3* 85.4* 60.5*  PO2ART 49.0* 126.0* 79.0* 80.0  HCO3 45.3* 46.5* 47.1* 45.1*  TCO2 47 49 50 47  O2SAT 81.0 98.0 94.0 96.0     Recent Labs  07/20/13 0020 07/20/13 0730  HGBA1C 6.3*  --   TRIG  --  62  CHOL  --  132  HDL  --  40  LDLCALC  --  80  TSH 0.657  --     Recent Labs Lab 07/21/13 1904 07/22/13 0452  TROPONINI <0.30 <0.30   07/19/13 - UA - 100 protein, neg UTI  Imaging/Diagnostic Tests: 1/5 1V CXR - Vascular Congestion, + Bronchitis 1/7 1V CXR - Improving congestion 1/8 CT Head w/o contrast - 4mm L pons hemorrhage; extensive small-vessel ischemic changes with remote infarcts 1/8 MRI Head w/o contrast - 5mm L pons hemorrhage; 3mm L pons acute infarct; remote B basal ganglia, thalamic, cerebellar infarcts; moderate microvascular ischemia 1/9 - CTA Chest - neg for PE, + nodule, mild emphysema; small B pleural effusions; small pericardial effusion  ECHO 1/6: + LVH, EF 55% poorly visualized wall motion. PA peak pressure 44mmHg. Small pericardial effusion Carotid doppler study: 1-39% B; some tortuosity with ? 40-50% at L mid ICA  Hazeline Junkeryan Lowell Makara, MD 07/27/2013, 8:02 AM PGY-1, [facility] FPTS Intern pager: [phone], text pages welcome

## 2013-07-28 DIAGNOSIS — I10 Essential (primary) hypertension: Secondary | ICD-10-CM | POA: Diagnosis not present

## 2013-07-28 DIAGNOSIS — I619 Nontraumatic intracerebral hemorrhage, unspecified: Secondary | ICD-10-CM | POA: Diagnosis not present

## 2013-07-28 DIAGNOSIS — G9340 Encephalopathy, unspecified: Secondary | ICD-10-CM | POA: Diagnosis not present

## 2013-07-28 DIAGNOSIS — I509 Heart failure, unspecified: Secondary | ICD-10-CM | POA: Diagnosis not present

## 2013-07-28 LAB — BASIC METABOLIC PANEL
BUN: 20 mg/dL (ref 6–23)
CHLORIDE: 96 meq/L (ref 96–112)
CO2: 33 mEq/L — ABNORMAL HIGH (ref 19–32)
Calcium: 8.2 mg/dL — ABNORMAL LOW (ref 8.4–10.5)
Creatinine, Ser: 0.89 mg/dL (ref 0.50–1.10)
GFR calc non Af Amer: 64 mL/min — ABNORMAL LOW (ref >90)
GFR, EST AFRICAN AMERICAN: 74 mL/min — AB (ref >90)
Glucose, Bld: 112 mg/dL — ABNORMAL HIGH (ref 70–99)
Potassium: 3.4 mEq/L — ABNORMAL LOW (ref 3.7–5.3)
SODIUM: 139 meq/L (ref 137–147)

## 2013-07-28 MED ORDER — POLYETHYLENE GLYCOL 3350 17 G PO PACK
17.0000 g | PACK | Freq: Two times a day (BID) | ORAL | Status: DC
  Administered 2013-07-28 – 2013-07-29 (×2): 17 g via ORAL
  Filled 2013-07-28 (×4): qty 1

## 2013-07-28 MED ORDER — SENNOSIDES-DOCUSATE SODIUM 8.6-50 MG PO TABS
1.0000 | ORAL_TABLET | Freq: Two times a day (BID) | ORAL | Status: DC
  Administered 2013-07-28 (×2): 1 via ORAL
  Filled 2013-07-28 (×4): qty 1

## 2013-07-28 MED ORDER — ENSURE PUDDING PO PUDG
1.0000 | Freq: Two times a day (BID) | ORAL | Status: DC
  Administered 2013-07-28 – 2013-07-29 (×2): 1 via ORAL

## 2013-07-28 MED ORDER — POTASSIUM CHLORIDE CRYS ER 20 MEQ PO TBCR
40.0000 meq | EXTENDED_RELEASE_TABLET | Freq: Once | ORAL | Status: AC
Start: 2013-07-28 — End: 2013-07-28
  Administered 2013-07-28: 11:00:00 40 meq via ORAL
  Filled 2013-07-28: qty 2

## 2013-07-28 NOTE — Progress Notes (Signed)
NUTRITION FOLLOW UP  Intervention:   Provide Ensure Pudding BID Encourage PO intake  Nutrition Dx:   Inadequate oral intake related to inability to eat as evidenced by NPO; ongoing- diet advanced but po intake remains inadequate, </= 50% of meals   Monitor:   1. Food/Beverage; pt to meet >/=90% estimated needs with tolerance.  2. Wt/wt change; monitor trends  Assessment:   Pt admitted with dyspnea, acute CHF, and subsequently developed AMS. Pt underwent CT which showed possible pontine stroke.  Pt seemingly confused at time of visit but, alert and able to answer questions. Pt reports decreased appetite and eating 50% of her meals the past few days. Pt states she usually weighs 135 lbs but, also reports that 120 lbs is a normal weight for her. Per nursing notes pt is eating 75% of meals.  Last BM 1/4.   Height: Ht Readings from Last 1 Encounters:  07/19/13 5\' 7"  (1.702 m)    Weight Status:   Wt Readings from Last 1 Encounters:  07/28/13 123 lb 12.8 oz (56.155 kg)    Re-estimated needs:  Kcal: 1520-1630  Protein: 65-76g  Fluid: >1.5 L/day  Skin: intact  Diet Order: Cardiac   Intake/Output Summary (Last 24 hours) at 07/28/13 1025 Last data filed at 07/28/13 0400  Gross per 24 hour  Intake   1455 ml  Output    800 ml  Net    655 ml    Last BM: 1/4   Labs:   Recent Labs Lab 07/21/13 1705 07/22/13 0452  07/27/13 0302 07/27/13 1327 07/28/13 0245  NA 147 146  < > 139 142 139  K 3.6* 4.2  < > 3.6* 3.5* 3.4*  CL 94* 95*  < > 92* 95* 96  CO2 35* 39*  < > 36* 37* 33*  BUN 9 10  < > 26* 24* 20  CREATININE 0.80 0.80  < > 0.83 1.02 0.89  CALCIUM 8.3* 8.2*  < > 8.5 8.3* 8.2*  MG 1.3* 3.1*  --   --   --   --   GLUCOSE 88 95  < > 123* 146* 112*  < > = values in this interval not displayed.  CBG (last 3)   Recent Labs  07/27/13 0737  GLUCAP 116*    Scheduled Meds: . amLODipine  5 mg Oral Daily  . antiseptic oral rinse  15 mL Mouth Rinse q12n4p  .  aspirin  81 mg Oral Daily  . atorvastatin  40 mg Oral q1800  . chlorhexidine  15 mL Mouth Rinse BID  . losartan  50 mg Oral Daily  . polyethylene glycol  17 g Oral BID  . potassium chloride  40 mEq Oral Once  . senna-docusate  1 tablet Oral BID  . sodium chloride  3 mL Intravenous Q12H  . sodium chloride  3 mL Intravenous Q12H    Continuous Infusions: . sodium chloride 75 mL/hr at 07/27/13 1503    Ian Malkineanne Barnett RD, LDN Inpatient Clinical Dietitian Pager: [phone] After Hours Pager: [phone]

## 2013-07-28 NOTE — Progress Notes (Signed)
I met with pt at bedside and then contacted her daughter, Colletta [place], by phone. We discussed inpt rehab admission prior to her d/c home. Daughter works from home. Both are in agreement if bed is available tomorrow. I will clarify in the am bed availability. [phone]

## 2013-07-28 NOTE — Progress Notes (Signed)
FMTS Attending Note   I personally saw and evaluated the patient. The plan of care was discussed with the resident team. I agree with the assessment and plan as documented by the resident.   Patient is alert and oriented X3 today   1. Encephalopathy - significantly improved, likely multifactorial from recent hemorrhagic stroke/malignant HTN/delirium  2. Pontine CVA - patient clinically doing well at this time, minimal residual deficits, patient has been cleared to go to CIR  3. Malignant HTN - resolved, pressures under better control, agree with resident plan  4. CHF exacerbation - resolved 5. Other medical issues stable   Disposition: Discharge to CIR when bed available  Donnella ShamKyle Markeisha Mancias MD

## 2013-07-28 NOTE — Progress Notes (Signed)
Pt transferred to floor. Pt oriented to room and call bell. Pt alert and oriented x4 and in no apparent distress. VSS. Will continue to monitor. Huel Coventryosenberger, Reigan Tolliver A, RN

## 2013-07-28 NOTE — Progress Notes (Signed)
Family Medicine Teaching Service Daily Progress Note Intern Pager: [phone]  Patient name: Angelica Mcbride Medical record number: [id number] Date of birth: [birth year] Age: 73 y.o. Gender: female  Primary Care Provider: No PCP Per Patient Consultants: CCM, neurology Code Status: Full  Pt Overview and Major Events to Date:  1/5: Admitted for acute CHF, lasix 40mg  IV TID 1/6: Off NTG gtt, losartan and norvasc started 1/7: Down nearly 20 lbs from admission. Decrease lasix to 40mg  IV daily 1/8: MRI shows pontine hemmorhage with infarct and multiple bilateral likely hypertensive infarcts 1/10: Continue waxing and waning encephalopathy, Repeat CT improved hemorrhage 1/12: Therapy evaluations, disposition pending 1/13 Transfer to floor, CIR to assess 1/14: Stable on floor  Assessment and Plan: Angelica Mcbride is a 73 y.o. year old female presenting with acute congestive heart failure with the background of likely COPD and HTN.   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  - ASA 81mg  for secondary stroke prevention, on lipitor - Work up negative - Dispo: CIR  Malignant Hypertension: Likely cause of pontine and other remote infarcts. - Goal SBP < - On ARB (losartan 50mg ) and norvasc 5mg  daily, required hydralazine prn x1 yesterday  Acute CHF: Resolved; hovering around 20-25lbs below admission weight, now off lasix - 2D ECHO: EF 55%, likely diastolic dysfunction with moderate LVH, pulmonary HTN.   Hypernatremia: Resolved 1/12, likely iatrogenic. - d/c daily lasix  Constipation: LBM reported 1/4 with poor po during much of that time - miralax 17g BID and  senna-ducosate BID  Acute respiratory failure: Resolved.  COPD: Presumptive dx with 50 pack-year hx, 40 year hx chronic cough. Bronchitic changes on CXR. Chronic, compensated respiratory acidosis and hypoxia on ABG. - O2 prn - Duonebs prn wheezing  Paroxysmal ventricular tachycardia Noted 2 runs 1/7, given Mag. No symptoms. Resolved 1/8  Hypokalemia repleting prn  Hypomagnesemia Resolved 1/8 s/p repletion  Anemia + thrombocytopenia, mild, normocytic: Stable, continue to monitor.   Pulmonary Nodule: 4.80mmg Nodule in R middle lobes, needs follow up CT in 6 months  FEN/GI: Heart healthy diet, Strict I/O, Saline Lock IV  Prophylaxis: SCDs  Disposition: D/C to CIR per PM&R  Subjective: Pt alert, oriented x3. Conversive. Denies pain or trouble breathing.   Objective: Temp:  [97.9 F (36.6 C)-98.7 F (37.1 C)] 98.4 F (36.9 C) (01/14 0518) Pulse Rate:  [75-115] 94 (01/14 0518) Resp:  [20] 20 (01/14 0518) BP: (122-182)/(61-113) 164/78 mmHg (01/14 0525) SpO2:  [93 %-100 %] 93 % (01/14 0518) Weight:  [123 lb 12.8 oz (56.155 kg)-124 lb 3.2 oz (56.337 kg)] 123 lb 12.8 oz (56.155 kg) (01/14 0518)  Physical Exam: Gen: Thin, elderly somnolent female able to follow instructions in NAD. HEENT: NCAT, EOMI, MMM, supple neck with full range of motion. CV: RRR, no murmur  Resp: Overall diminished breath sounds, normal work of breathing, no wheezes noted  Abd: SNTND, BS present, no guarding or organomegaly  Ext: no edema, warm, no tenderness in bilateral legs.  Neuro: A&O x3. MAE. Speech delayed but normal.  Laboratory:  Recent Labs Lab 07/25/13 0232 07/26/13 0700 07/27/13 0302  WBC 11.7* 11.9* 8.7  HGB 13.0 13.7 12.5  HCT 40.9 42.7 37.4  PLT 116* 128* 143*    Recent  Labs Lab 07/25/13 1830 07/26/13 0700  07/27/13 0302 07/27/13 1327 07/28/13 0245  NA 155* 151*  < > 139 142 139  K 3.6* 4.1  < > 3.6* 3.5* 3.4*  CL 98 99  < > 92* 95* 96  CO2 37* 36*  < > 36* 37* 33*  BUN  36* 34*  < > 26* 24* 20  CREATININE 1.01 0.93  < > 0.83 1.02 0.89  CALCIUM 8.7 8.6  < > 8.5 8.3* 8.2*  PROT  --  7.0  --   --   --   --   BILITOT  --  0.5  --   --   --   --   ALKPHOS  --  47  --   --   --   --   ALT  --  16  --   --   --   --   AST  --  33  --   --   --   --   GLUCOSE 95 87  < > 123* 146* 112*  < > = values in this interval not displayed.  Recent Labs Lab 07/21/13 1219 07/21/13 1709 07/24/13 1110  PHART 7.340* 7.349* 7.481*  PCO2ART 86.3* 85.4* 60.5*  PO2ART 126.0* 79.0* 80.0  HCO3 46.5* 47.1* 45.1*  TCO2 49 50 47  O2SAT 98.0 94.0 96.0     Recent Labs  07/20/13 0020 07/20/13 0730  HGBA1C 6.3*  --   TRIG  --  62  CHOL  --  132  HDL  --  40  LDLCALC  --  80  TSH 0.657  --     Recent Labs Lab 07/21/13 1904 07/22/13 0452  TROPONINI <0.30 <0.30   07/19/13 - UA - 100 protein, neg UTI  Imaging/Diagnostic Tests: 1/5 1V CXR - Vascular Congestion, + Bronchitis 1/7 1V CXR - Improving congestion 1/8 CT Head w/o contrast - 4mm L pons hemorrhage; extensive small-vessel ischemic changes with remote infarcts 1/8 MRI Head w/o contrast - 5mm L pons hemorrhage; 3mm L pons acute infarct; remote B basal ganglia, thalamic, cerebellar infarcts; moderate microvascular ischemia 1/9 - CTA Chest - neg for PE, + nodule, mild emphysema; small B pleural effusions; small pericardial effusion  ECHO 1/6: + LVH, EF 55% poorly visualized wall motion. PA peak pressure 44mmHg. Small pericardial effusion Carotid doppler study: 1-39% B; some tortuosity with ? 40-50% at L mid ICA  Hazeline Junkeryan Jovanna Hodges, MD 07/28/2013, 8:37 AM PGY-1, [facility] FPTS Intern pager: [phone], text pages welcome

## 2013-07-28 NOTE — Progress Notes (Signed)
Physical Therapy Treatment Patient Details Name: Angelica Mcbride MRN: [id number] DOB: 10-Dec-1940 Today's Date: 07/28/2013 Time: 4098-1191 PT Time Calculation (min): 23 min  PT Assessment / Plan / Recommendation  History of Present Illness Angelica Mcbride is an 73 y.o. female presenting to hospital with acute congestive heart failure, hypernatremia, hypertensive urgency requiring Nitro drip. Patient was admitted for her worsening COPD. On hospital day 2 she was noted to have worsening lethargy and confusion. Patient was placed on BIPAP for worsening hypercapnia most recent pCO2 of 86 and 85. CT head was obtained showing a 4 mm high-density focus in the lower left pons which could represent hemorrhagic infarct. 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     PT Comments   Pt continues with cognitive and balance deficits and will benefit from CIR for continued PT at d/c.  Follow Up Recommendations  CIR     Does the patient have the potential to tolerate intense rehabilitation     Barriers to Discharge        Equipment Recommendations       Recommendations for Other Services    Frequency Min 4X/week   Progress towards PT Goals Progress towards PT goals: Progressing toward goals  Plan Current plan remains appropriate    Precautions / Restrictions Precautions Precautions: Fall Restrictions Weight Bearing Restrictions: No   Pertinent Vitals/Pain No c/o pain    Mobility  Bed Mobility Overal bed mobility: Modified Independent Transfers Overall transfer level: Needs assistance Equipment used: None Transfers: Sit to/from Stand Sit to Stand: Min guard;Supervision General transfer comment: steadying assist for balance Ambulation/Gait Ambulation/Gait assistance: Min assist Ambulation Distance (Feet): 200 Feet Assistive device: None General Gait Details: pt with unsteady gait, reaches for handrail, requires min A for frequent LOB.  Attempted gait with  cognitive challenges for divided attention task.  Pt continues to require min A for gait but requires max A for cognitive naming task    Exercises     PT Diagnosis:    PT Problem List:   PT Treatment Interventions:     PT Goals (current goals can now be found in the care plan section)    Visit Information  Last PT Received On: 07/28/13 Assistance Needed: +1 History of Present Illness: Angelica Mcbride is an 73 y.o. female presenting to hospital with acute congestive heart failure, hypernatremia, hypertensive urgency requiring Nitro drip. Patient was admitted for her worsening COPD. On hospital day 2 she was noted to have worsening lethargy and confusion. Patient was placed on BIPAP for worsening hypercapnia most recent pCO2 of 86 and 85. CT head was obtained showing a 4 mm high-density focus in the lower left pons which could represent hemorrhagic infarct. 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      Subjective Data      Cognition  Cognition Arousal/Alertness: Awake/alert Behavior During Therapy: Flat affect Overall Cognitive Status: Impaired/Different from baseline Orientation Level: Disoriented to;Situation Current Attention Level: Sustained Memory: Decreased recall of precautions;Decreased short-term memory Following Commands: Follows one step commands consistently Safety/Judgement: Decreased awareness of safety;Decreased awareness of deficits Awareness: Intellectual Problem Solving: Slow processing;Decreased initiation;Requires verbal cues;Requires tactile cues    Balance  Balance Overall balance assessment: Needs assistance Standing balance support: No upper extremity supported Standing balance-Leahy Scale: Fair High Level Balance Comments: performed static balance with eyes open, eyes closed, wide and narrow BOS and head turns with min guard/supervision.  Dynamic standing balance with tapping all directions with min A  End  of Session PT - End  of Session Equipment Utilized During Treatment: Gait belt Activity Tolerance: Patient tolerated treatment well Patient left: in bed;with call bell/phone within reach;with bed alarm set Nurse Communication: Mobility status   GP     Thijs Brunton 07/28/2013, 11:25 AM

## 2013-07-29 ENCOUNTER — Encounter (HOSPITAL_COMMUNITY): Payer: Self-pay | Admitting: General Practice

## 2013-07-29 DIAGNOSIS — I619 Nontraumatic intracerebral hemorrhage, unspecified: Secondary | ICD-10-CM | POA: Diagnosis not present

## 2013-07-29 DIAGNOSIS — I509 Heart failure, unspecified: Secondary | ICD-10-CM | POA: Diagnosis not present

## 2013-07-29 DIAGNOSIS — I10 Essential (primary) hypertension: Secondary | ICD-10-CM | POA: Diagnosis not present

## 2013-07-29 DIAGNOSIS — G9340 Encephalopathy, unspecified: Secondary | ICD-10-CM | POA: Diagnosis not present

## 2013-07-29 LAB — BASIC METABOLIC PANEL
BUN: 41 mg/dL — ABNORMAL HIGH (ref 6–23)
BUN: 13 mg/dL (ref 6–23)
CO2: 17 meq/L — AB (ref 19–32)
CO2: 30 mEq/L (ref 19–32)
Calcium: 8.7 mg/dL (ref 8.4–10.5)
Calcium: 8.3 mg/dL — ABNORMAL LOW (ref 8.4–10.5)
Chloride: 105 mEq/L (ref 96–112)
Chloride: 98 mEq/L (ref 96–112)
Creatinine, Ser: 2.92 mg/dL — ABNORMAL HIGH (ref 0.50–1.10)
Creatinine, Ser: 0.81 mg/dL (ref 0.50–1.10)
GFR calc Af Amer: 18 mL/min — ABNORMAL LOW (ref >90)
GFR, EST AFRICAN AMERICAN: 83 mL/min — AB (ref >90)
GFR, EST NON AFRICAN AMERICAN: 15 mL/min — AB (ref >90)
GFR, EST NON AFRICAN AMERICAN: 71 mL/min — AB (ref >90)
Glucose, Bld: 81 mg/dL (ref 70–99)
Glucose, Bld: 133 mg/dL — ABNORMAL HIGH (ref 70–99)
POTASSIUM: 3.8 meq/L (ref 3.7–5.3)
Potassium: 4.2 mEq/L (ref 3.7–5.3)
SODIUM: 139 meq/L (ref 137–147)
SODIUM: 138 meq/L (ref 137–147)

## 2013-07-29 MED ORDER — LOSARTAN POTASSIUM 50 MG PO TABS: 50.0000 mg | ORAL_TABLET | Freq: Every day | ORAL | Status: DC

## 2013-07-29 MED ORDER — FUROSEMIDE 40 MG PO TABS
40.0000 mg | ORAL_TABLET | Freq: Every day | ORAL | Status: DC
  Administered 2013-07-29: 40 mg via ORAL
  Filled 2013-07-29: qty 1

## 2013-07-29 MED ORDER — AMLODIPINE BESYLATE 5 MG PO TABS: 5.0000 mg | ORAL_TABLET | Freq: Every day | ORAL | Status: DC

## 2013-07-29 MED ORDER — FUROSEMIDE 40 MG PO TABS: 40.0000 mg | ORAL_TABLET | Freq: Every day | ORAL | Status: DC

## 2013-07-29 MED ORDER — LOSARTAN POTASSIUM 50 MG PO TABS
50.0000 mg | ORAL_TABLET | Freq: Every day | ORAL | Status: DC
  Administered 2013-07-29: 13:00:00 50 mg via ORAL
  Filled 2013-07-29: qty 1

## 2013-07-29 MED ORDER — ASPIRIN 81 MG PO CHEW: 81.0000 mg | CHEWABLE_TABLET | Freq: Every day | ORAL | Status: DC

## 2013-07-29 MED ORDER — ATORVASTATIN CALCIUM 40 MG PO TABS: 40.0000 mg | ORAL_TABLET | Freq: Every day | ORAL | Status: DC

## 2013-07-29 NOTE — Progress Notes (Signed)
Physical Therapy Treatment Patient Details Name: Angelica RamsayJoan Neukam MRN: [id number] DOB: [birth year] Today's Date: 07/29/2013 Time: [id number] PT Time Calculation (min): 18 min  PT Assessment / Plan / Recommendation  History of Present Illness Angelica Mcbride is an 73 y.o. female presenting to hospital with acute congestive heart failure, hypernatremia, hypertensive urgency requiring Nitro drip. Patient was admitted for her worsening COPD. On hospital day 2 she was noted to have worsening lethargy and confusion. Patient was placed on BIPAP for worsening hypercapnia most recent pCO2 of 86 and 85. CT head was obtained showing a 4 mm high-density focus in the lower left pons which could represent hemorrhagic infarct. 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     PT Comments   Pt seen by Kingsley CallanderKaren Smith, PT this AM and she completed information in flowsheets but did not pull information into the progress note section.  All information in this note is from Kingsley CallanderKaren Smith, PT I am just pulling it into the PN section so all can view the details of her treatmetn sesion. Sorry for the delay.  Follow Up Recommendations  CIR     Does the patient have the potential to tolerate intense rehabilitation     Barriers to Discharge        Equipment Recommendations       Recommendations for Other Services    Frequency Min 4X/week   Progress towards PT Goals Progress towards PT goals: Progressing toward goals  Plan Current plan remains appropriate    Precautions / Restrictions Precautions Precautions: Fall   Pertinent Vitals/Pain     Mobility  Bed Mobility Overal bed mobility: Modified Independent General bed mobility comments:  (came to EOB with HOB elevated and with rail) Transfers Overall transfer level: Needs assistance Equipment used: Rolling walker (2 wheeled) Transfers: Sit to/from Stand Sit to Stand: Min guard General transfer comment:  (cueing for hand placement for  increased safety) Ambulation/Gait Ambulation/Gait assistance: Min guard Ambulation Distance (Feet): 110 Feet (x2 reps) Assistive device: Rolling walker (2 wheeled);None Gait Pattern/deviations: Decreased step length - right;Decreased step length - left;Trunk flexed General Gait Details: Trial with RW and without RW.  Pt is safer with RW, but had difficulty with doorways and obstacles with maneuvering it.  Without RW, pt holding rail or therapist's hand.  Pt tends to lean forward causing increased unsteadiness. Modified Rankin (Stroke Patients Only) Pre-Morbid Rankin Score: No symptoms    Exercises     PT Diagnosis:    PT Problem List:   PT Treatment Interventions:     PT Goals (current goals can now be found in the care plan section) Acute Rehab PT Goals Time For Goal Achievement: 08/07/13 Potential to Achieve Goals: Good  Visit Information  Last PT Received On: 07/29/13 Assistance Needed: +1 History of Present Illness: Angelica RamsayJoan Bollard is an 73 y.o. female presenting to hospital with acute congestive heart failure, hypernatremia, hypertensive urgency requiring Nitro drip. Patient was admitted for her worsening COPD. On hospital day 2 she was noted to have worsening lethargy and confusion. Patient was placed on BIPAP for worsening hypercapnia most recent pCO2 of 86 and 85. CT head was obtained showing a 4 mm high-density focus in the lower left pons which could represent hemorrhagic infarct. 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      Subjective Data      Cognition  Cognition Arousal/Alertness: Awake/alert Behavior During Therapy: WFL for tasks assessed/performed Overall Cognitive Status: Impaired/Different from baseline Area of  Impairment: Memory;Safety/judgement Memory: Decreased short-term memory Following Commands: Follows one step commands consistently Safety/Judgement: Decreased awareness of deficits;Decreased awareness of safety Problem  Solving: Decreased initiation;Requires verbal cues    Balance  Balance Overall balance assessment: Needs assistance Standing balance-Leahy Scale: Fair Standing balance comment: tends to lean forward with gait. High Level Balance Comments:  (pt declined standing balance ther ex as she was hungry.)  End of Session PT - End of Session Equipment Utilized During Treatment: Gait belt Activity Tolerance: Patient tolerated treatment well Patient left: in bed;with call bell/phone within reach Nurse Communication: Other (comment) (spoke with CNA re: request for food)   GP     Donnella Sham 07/29/2013, 1:42 PM Lavona Mound, PT  [phone] 07/29/2013

## 2013-07-29 NOTE — Progress Notes (Signed)
FMTS Attending Note  I personally saw and evaluated the patient. The plan of care was discussed with the resident team. I agree with the assessment and plan as documented by the resident.   Stable for discharge to CIR when bed available.   Donnella ShamKyle Samrat Hayward MD

## 2013-07-29 NOTE — Progress Notes (Signed)
Family Medicine Teaching Service Daily Progress Note Intern Pager: [phone]  Patient name: Angelica Mcbride Medical record number: [id number] Date of birth: 08-Dec-1940 Age: 73 y.o. Gender: female  Primary Care Provider: No PCP Per Patient Consultants: CCM, neurology Code Status: Full  Pt Overview and Major Events to Date:  1/5: Admitted for acute CHF, lasix 40mg  IV TID 1/6: Off NTG gtt, losartan and norvasc started 1/7: Down nearly 20 lbs from admission. Decrease lasix to 40mg  IV daily 1/8: MRI shows pontine hemmorhage with infarct and multiple bilateral likely hypertensive infarcts 1/10: Continue waxing and waning encephalopathy, Repeat CT improved hemorrhage 1/12: Therapy evaluations, disposition pending 1/13 Transfer to floor, CIR to assess 1/14: Stable on floor, CIR checking for bed availability 1/15: AM BMP showing ARF with acidosis, suspected to be erroneous, STAT redraw  Assessment and Plan: Angelica Mcbride is a 73 y.o. year old female presenting with acute congestive heart failure with the background of likely COPD and HTN.   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  - ASA 81mg  for secondary stroke prevention, on lipitor - Work up negative - Dispo: CIR  Malignant Hypertension: Likely cause of pontine and other remote infarcts.  - Goal SBP < - On ARB (losartan 50mg ) and norvasc 5mg  daily. At goal (150/70-80s)  Acute CHF: Resolved; hovering around 20-25lbs below admission weight, now off lasix - 2D ECHO: EF 55%, likely diastolic dysfunction with moderate LVH, pulmonary HTN.  - Weight creeping upwards (up 15 lbs in 5 days)  Hypernatremia:  Resolved 1/12 s/p d/c lasix, likely iatrogenic.  Constipation: Resolved 1/14.  - miralax 17g BID and senna-ducosate BID  Acute respiratory failure: Resolved.  COPD: Presumptive dx with 50 pack-year hx, 40 year hx chronic cough. Bronchitic changes on CXR. Chronic, compensated respiratory acidosis and hypoxia on ABG. - O2 prn - Duonebs prn given x2 yesterday  Paroxysmal ventricular tachycardia Noted 2 runs 1/7, given Mag. No symptoms. Resolved 1/8  Hypokalemia repleting prn  Hypomagnesemia Resolved 1/8 s/p repletion  Anemia + thrombocytopenia, mild, normocytic: Stable, continue to monitor.   Pulmonary Nodule: 4.38mmg Nodule in R middle lobes, needs follow up CT in 6 months  FEN/GI: Heart healthy diet, Strict I/O, Saline Lock IV  Prophylaxis: SCDs  Disposition: D/C to CIR pending bed availability  Subjective: Pt alert, oriented. Has been walking in hallway daily. Denies pain or current CP/SOB.  Objective: Temp:  [98 F (36.7 C)-98.2 F (36.8 C)] 98 F (36.7 C) (01/15 0501) Pulse Rate:  [89-97] 92 (01/15 0501) Resp:  [16-20] 18 (01/15 0501) BP: (150-160)/(76-94) 151/76 mmHg (01/15 0501) SpO2:  [92 %-96 %] 96 % (01/15 0501) Weight:  [126 lb 1.7 oz (57.2 kg)] 126 lb 1.7 oz (57.2 kg) (01/15 0501)  Physical Exam: Gen: Thin, elderly female in NAD. HEENT: NCAT, EOMI, MMM, supple neck with full range of motion. CV: RRR, no murmur  Resp: Non-labored, CTAB, no wheezing Abd: SNTND, BS present, no guarding or organomegaly  Ext: no edema, warm, no tenderness in bilateral legs.  Neuro: A&O x3. MAE. Speech delayed but normal.  Laboratory:  Recent Labs Lab 07/25/13 0232 07/26/13 0700 07/27/13 0302  WBC 11.7* 11.9* 8.7  HGB 13.0 13.7 12.5  HCT 40.9 42.7 37.4  PLT 116*  128* 143*    Recent Labs Lab 07/25/13 1830 07/26/13 0700  07/27/13 1327 07/28/13 0245 07/29/13 0515  NA 155* 151*  < > 142 139 139  K 3.6* 4.1  < > 3.5* 3.4* 4.2  CL 98 99  < > 95* 96 105  CO2 37* 36*   < > 37* 33* 17*  BUN 36* 34*  < > 24* 20 41*  CREATININE 1.01 0.93  < > 1.02 0.89 2.92*  CALCIUM 8.7 8.6  < > 8.3* 8.2* 8.7  PROT  --  7.0  --   --   --   --   BILITOT  --  0.5  --   --   --   --   ALKPHOS  --  47  --   --   --   --   ALT  --  16  --   --   --   --   AST  --  33  --   --   --   --   GLUCOSE 95 87  < > 146* 112* 81  < > = values in this interval not displayed.  Recent Labs Lab 07/24/13 1110  PHART 7.481*  PCO2ART 60.5*  PO2ART 80.0  HCO3 45.1*  TCO2 47  O2SAT 96.0     Recent Labs  07/20/13 0020 07/20/13 0730  HGBA1C 6.3*  --   TRIG  --  62  CHOL  --  132  HDL  --  40  LDLCALC  --  80  TSH 0.657  --    No results found for this basename: TROPIPOC, TROPONINI, PROBNP, CKTOTAL, CKMBINDEX,  in the last 168 hours 07/19/13 - UA - 100 protein, neg UTI  Imaging/Diagnostic Tests: 1/5 1V CXR - Vascular Congestion, + Bronchitis 1/7 1V CXR - Improving congestion 1/8 CT Head w/o contrast - 4mm L pons hemorrhage; extensive small-vessel ischemic changes with remote infarcts 1/8 MRI Head w/o contrast - 5mm L pons hemorrhage; 3mm L pons acute infarct; remote B basal ganglia, thalamic, cerebellar infarcts; moderate microvascular ischemia 1/9 - CTA Chest - neg for PE, + nodule, mild emphysema; small B pleural effusions; small pericardial effusion  ECHO 1/6: + LVH, EF 55% poorly visualized wall motion. PA peak pressure 44mmHg. Small pericardial effusion Carotid doppler study: 1-39% B; some tortuosity with ? 40-50% at L mid ICA  Hazeline Junkeryan Daliah Chaudoin, MD 07/29/2013, 8:14 AM PGY-1, [facility] FPTS Intern pager: [phone], text pages welcome

## 2013-07-29 NOTE — Progress Notes (Signed)
Patient's IV and telemetry has been discontinued,patient verbalizes understanding of discharge instructions and is being discharged home with home health services. Lorretta Harp. Mantaj Chamberlin RN

## 2013-07-29 NOTE — Progress Notes (Signed)
Patient ambulating in room this am to bathroom and sink with RN supervision without AD. Noted repeat labs and discussed with Dr. Jarvis NewcomerGrunz. I contacted pt's daughter, Angelica Mcbride, concerning the possibility of [facility]pt rehab [facility] Daughter works from home. I discussed that I await PT assessment this morning before determining rehab venue today. Patient requesting a RW or cane to be used today for she feels wobbly. Daughter states Mom's cognition close to baseline over the last 24 hrs.I am contacting PT. [phone]

## 2013-07-29 NOTE — Progress Notes (Signed)
Patient has made progress to a functional level to [facility] and needs a RW. I spoke with pt's daughter, by phone and she is in agreement to d/c home today. I have contacted CM and they are aware. [phone]

## 2013-08-01 NOTE — Discharge Summary (Signed)
I agree with the discharge summary as documented.   Tahisha Hakim MD  

## 2013-08-02 ENCOUNTER — Ambulatory Visit (INDEPENDENT_AMBULATORY_CARE_PROVIDER_SITE_OTHER): Payer: Medicare Other | Admitting: Family Medicine

## 2013-08-02 ENCOUNTER — Encounter: Payer: Self-pay | Admitting: Family Medicine

## 2013-08-02 VITALS — BP 128/80 | HR 84 | Temp 98.2°F | Ht 67.5 in | Wt 123.0 lb

## 2013-08-02 DIAGNOSIS — I509 Heart failure, unspecified: Secondary | ICD-10-CM | POA: Diagnosis not present

## 2013-08-02 DIAGNOSIS — I613 Nontraumatic intracerebral hemorrhage in brain stem: Secondary | ICD-10-CM

## 2013-08-02 DIAGNOSIS — I619 Nontraumatic intracerebral hemorrhage, unspecified: Secondary | ICD-10-CM

## 2013-08-02 DIAGNOSIS — Z720 Tobacco use: Secondary | ICD-10-CM

## 2013-08-02 DIAGNOSIS — F17200 Nicotine dependence, unspecified, uncomplicated: Secondary | ICD-10-CM

## 2013-08-02 DIAGNOSIS — I10 Essential (primary) hypertension: Secondary | ICD-10-CM

## 2013-08-02 LAB — BASIC METABOLIC PANEL
BUN: 7 mg/dL (ref 6–23)
CO2: 34 meq/L — AB (ref 19–32)
Calcium: 8.1 mg/dL — ABNORMAL LOW (ref 8.4–10.5)
Chloride: 98 mEq/L (ref 96–112)
Creat: 0.93 mg/dL (ref 0.50–1.10)
GLUCOSE: 87 mg/dL (ref 70–99)
POTASSIUM: 4.3 meq/L (ref 3.5–5.3)
Sodium: 140 mEq/L (ref 135–145)

## 2013-08-02 LAB — CBC WITH DIFFERENTIAL/PLATELET
Basophils Absolute: 0 10*3/uL (ref 0.0–0.1)
Basophils Relative: 1 % (ref 0–1)
Eosinophils Absolute: 0.2 10*3/uL (ref 0.0–0.7)
Eosinophils Relative: 4 % (ref 0–5)
HCT: 39.4 % (ref 36.0–46.0)
Hemoglobin: 12.5 g/dL (ref 12.0–15.0)
LYMPHS PCT: 18 % (ref 12–46)
Lymphs Abs: 0.9 10*3/uL (ref 0.7–4.0)
MCH: 25.4 pg — ABNORMAL LOW (ref 26.0–34.0)
MCHC: 31.7 g/dL (ref 30.0–36.0)
MCV: 79.9 fL (ref 78.0–100.0)
MONOS PCT: 28 % — AB (ref 3–12)
Monocytes Absolute: 1.5 10*3/uL — ABNORMAL HIGH (ref 0.1–1.0)
NEUTROS ABS: 2.6 10*3/uL (ref 1.7–7.7)
NEUTROS PCT: 49 % (ref 43–77)
Platelets: 180 10*3/uL (ref 150–400)
RBC: 4.93 MIL/uL (ref 3.87–5.11)
RDW: 18 % — ABNORMAL HIGH (ref 11.5–15.5)
WBC: 5.1 10*3/uL (ref 4.0–10.5)

## 2013-08-02 MED ORDER — FUROSEMIDE 20 MG PO TABS: 20.0000 mg | ORAL_TABLET | Freq: Every day | ORAL | Status: DC

## 2013-08-02 MED ORDER — PRAVASTATIN SODIUM 40 MG PO TABS: 40.0000 mg | ORAL_TABLET | Freq: Every day | ORAL | Status: DC

## 2013-08-02 NOTE — Progress Notes (Signed)
Patient ID: Angelica Mcbride, female   DOB: 05-23-1941, 73 y.o.   MRN: [id number]  Kevin Fenton, MD Phone: [phone]  Subjective:  Chief complaint-noted  # hospital follow up  Patient presents for hospital followup after being admitted initially for acute respiratory distress due to volume overload presumably due to diastolic heart failure. She reports that currently she is breathing much better than baseline and hasn't coughed since she left the hospital. She denies dyspnea, chest pain, headache. She states the only thing bothering her right now that she's had persistent hoarseness since leaving the hospital.  Her hospital course was complicated by altered female status which started approximately one day after admission. Throughout that time she had hypercarbia with PCO2 of 83 one time. She had malignant hypertension which resulted in a small pontine hemorrhage which resolved on a repeated CT.  She gets a very good urinary response from her current Lasix dose. She's been able to get all her medications, but would like a cheaper alternative to Lipitor.  she does not have a scale and does not routinely weigh herself, she feels that she can probably afford a scale.  - ROS- No fever, chills, sweats No dyspnea No chest pain No abdominal pain  Past Medical History Patient Active Problem List   Diagnosis Date Noted  . Tobacco abuse 08/02/2013  . 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 Acute congestive heart failure 07/19/2013  . HTN (hypertension) 07/19/2013    Medications- reviewed and updated Current Outpatient Prescriptions  Medication Sig Dispense Refill  . acetaminophen (TYLENOL) 500 MG tablet Take 500 mg by mouth every 8 (eight) hours as needed for mild pain.      Marland Kitchen amLODipine (NORVASC) 5 MG tablet Take 1 tablet (5 mg total) by mouth daily.  30 tablet  0  . aspirin 81 MG chewable tablet Chew 1 tablet (81 mg total) by mouth daily.  30 tablet  0  . EPINEPHrine Base (PRIMATENE MIST IN)  Inhale 1-2 puffs into the lungs every 8 (eight) hours as needed (for congestion).      . furosemide (LASIX) 20 MG tablet Take 1 tablet (20 mg total) by mouth daily.  30 tablet  2  . guaiFENesin (MUCINEX) 600 MG 12 hr tablet Take 600 mg by mouth 2 (two) times daily as needed for to loosen phlegm.      Marland Kitchen losartan (COZAAR) 50 MG tablet Take 1 tablet (50 mg total) by mouth daily.  30 tablet  0  . pravastatin (PRAVACHOL) 40 MG tablet Take 1 tablet (40 mg total) by mouth daily.  90 tablet  3   No current facility-administered medications for this visit.    Objective: BP 128/80  Pulse 84  Temp(Src) 98.2 F (36.8 C) (Oral)  Ht 5' 7.5" (1.715 m)  Wt 123 lb (55.792 kg)  BMI 18.97 kg/m2 Gen: NAD, alert, cooperative with exam HEENT: NCAT, mmm CV: RRR, good S1/S2, no murmur Resp: CTABL, no wheezes, non-labored Abd: SNTND, BS present, no guarding or organomegaly Ext: trace to 1+ pitting edema bilaterally Neuro: Alert and oriented, No gross deficits   Assessment/Plan:  Acute congestive heart failure Acute exacerbation resolved, no crackles and only mild edema bilaterally She's continued to lose weight since she left the hospital Will decrease furosemide to 20 mg daily Advised getting a scale and monitoring weight 18 year ASCVD score is 14.7 so will continue statin, but will reduce to pravastatin given their preference Followup 2 weeks to try and pin down how much Lasix she needs  to be on   HTN (hypertension) Well-controlled today continue amlodipine and losartan Followup 2 weeks  Pontine hemorrhage Resolved on repeat CT scan in the hospital Blood pressure well controlled today and no concerns for extension  Will monitor  Tobacco abuse Patient very contemplative about quitting Now down to one to 2 cigarettes daily, I encouraged her to quit while she is at such a low level.    Orders Placed This Encounter  Procedures  . CBC with Differential  . Basic Metabolic Panel    Meds  ordered this encounter  Medications  . pravastatin (PRAVACHOL) 40 MG tablet    Sig: Take 1 tablet (40 mg total) by mouth daily.    Dispense:  90 tablet    Refill:  3  . furosemide (LASIX) 20 MG tablet    Sig: Take 1 tablet (20 mg total) by mouth daily.    Dispense:  30 tablet    Refill:  2

## 2013-08-02 NOTE — Patient Instructions (Signed)
It was great to see you today!  Come back in 2-3 weeks  Keep track of your weight, I will consider your dry weight to be 123

## 2013-08-02 NOTE — Assessment & Plan Note (Signed)
Well-controlled today continue amlodipine and losartan Followup 2 weeks

## 2013-08-02 NOTE — Assessment & Plan Note (Signed)
Resolved on repeat CT scan in the hospital Blood pressure well controlled today and no concerns for extension  Will monitor

## 2013-08-02 NOTE — Assessment & Plan Note (Addendum)
Acute exacerbation resolved, no crackles and only mild edema bilaterally She's continued to lose weight since she left the hospital Will decrease furosemide to 20 mg daily Advised getting a scale and monitoring weight 18 year ASCVD score is 14.7 so will continue statin, but will reduce to pravastatin given their preference Followup 2 weeks to try and pin down how much Lasix she needs to be on

## 2013-08-02 NOTE — Assessment & Plan Note (Signed)
Patient very contemplative about quitting Now down to one to 2 cigarettes daily, I encouraged her to quit while she is at such a low level.

## 2013-08-03 NOTE — Progress Notes (Signed)
Patient assessed by CIR and has progressed to where she would benefit from d/c home with Home Health.  SNF is not indicated.  RNCM is aware. CSW signing off.  Lorri Frederickonna T. West PughCrowder, LCSWA  [phone]

## 2013-08-06 ENCOUNTER — Encounter: Payer: Self-pay | Admitting: Family Medicine

## 2013-08-26 ENCOUNTER — Ambulatory Visit (INDEPENDENT_AMBULATORY_CARE_PROVIDER_SITE_OTHER): Payer: Medicare Other | Admitting: Family Medicine

## 2013-08-26 ENCOUNTER — Encounter: Payer: Self-pay | Admitting: Family Medicine

## 2013-08-26 VITALS — BP 165/97 | HR 80 | Temp 97.8°F | Ht 67.0 in | Wt 126.0 lb

## 2013-08-26 DIAGNOSIS — I5032 Chronic diastolic (congestive) heart failure: Secondary | ICD-10-CM | POA: Diagnosis not present

## 2013-08-26 DIAGNOSIS — I10 Essential (primary) hypertension: Secondary | ICD-10-CM

## 2013-08-26 DIAGNOSIS — I509 Heart failure, unspecified: Secondary | ICD-10-CM

## 2013-08-26 DIAGNOSIS — F17200 Nicotine dependence, unspecified, uncomplicated: Secondary | ICD-10-CM | POA: Diagnosis not present

## 2013-08-26 DIAGNOSIS — Z720 Tobacco use: Secondary | ICD-10-CM

## 2013-08-26 MED ORDER — AMLODIPINE BESYLATE 10 MG PO TABS: 10.0000 mg | ORAL_TABLET | Freq: Every day | ORAL | Status: DC

## 2013-08-26 MED ORDER — FUROSEMIDE 20 MG PO TABS: 20.0000 mg | ORAL_TABLET | Freq: Every day | ORAL | Status: DC

## 2013-08-26 NOTE — Progress Notes (Signed)
Patient ID: Angelica Mcbride, female   DOB: [birth year], 73 y.o.   MRN: [id number]  Kevin Fenton, MD Phone: [phone]  Subjective:  Chief complaint-noted  # Patient here to followup for heart failure and hypertension  Heart failure States that she is breathing well, she feels that her dry weight is around 123. She's taking her Lasix every day and still having a good urinary response.  Hypertension She denies headache, dyspnea, worsening leg edema, or palpitations. She reports good compliance She reports one week where she had continual episodes of sharp stabbing left-sided chest pain which did not radiate and were not associated with nausea, vomiting, palpitations, sweating, or weakness. She feels that this was related to amoxicillin which she was taking for an abscessed tooth.  She wants clearance for oral surgery, which I wrote a letter for her.  ROS- No fever, chills, sweats    Past Medical History Patient Active Problem List   Diagnosis Date Noted  . Chronic diastolic congestive heart failure 08/26/2013  . Tobacco abuse 08/02/2013  . 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 Acute congestive heart failure 07/19/2013  . HTN (hypertension) 07/19/2013    Medications- reviewed and updated Current Outpatient Prescriptions  Medication Sig Dispense Refill  . acetaminophen (TYLENOL) 500 MG tablet Take 500 mg by mouth every 8 (eight) hours as needed for mild pain.      Marland Kitchen amLODipine (NORVASC) 10 MG tablet Take 1 tablet (10 mg total) by mouth daily.  90 tablet  3  . aspirin 81 MG chewable tablet Chew 1 tablet (81 mg total) by mouth daily.  30 tablet  0  . furosemide (LASIX) 20 MG tablet Take 1 tablet (20 mg total) by mouth daily.  90 tablet  1  . guaiFENesin (MUCINEX) 600 MG 12 hr tablet Take 600 mg by mouth 2 (two) times daily as needed for to loosen phlegm.      Marland Kitchen losartan (COZAAR) 50 MG tablet Take 1 tablet (50 mg total) by mouth daily.  30 tablet  0  . pravastatin (PRAVACHOL) 40 MG  tablet Take 1 tablet (40 mg total) by mouth daily.  90 tablet  3   No current facility-administered medications for this visit.    Objective: BP 165/97  Pulse 80  Temp(Src) 97.8 F (36.6 C) (Oral)  Ht 5\' 7"  (1.702 m)  Wt 126 lb (57.153 kg)  BMI 19.73 kg/m2 Gen: NAD, alert, cooperative with exam HEENT: NCAT CV: RRR, good S1/S2, no murmur Resp: CTABL, no wheezes, non-labored Abd: SNTND, BS present, no guarding or organomegaly Ext: 1+ pitting edema on BL LE Neuro: Alert and oriented, No gross deficits   Assessment/Plan:  Chronic diastolic congestive heart failure Stable today, dry weight appears to be around 123-126 Continue Lasix 20 mg daily, recent BMP within normal limits Continue losartan, increase amlodipine  Will send to heart failure clinic for evaluation and guidance on treatment Recent presentation to the hospital was in florid heart failure, her echo then showed a normal EF, LVH, and did not comment on diastolic dysfunction although I suspect there is at least some component of diastolic dysfunction.  HTN (hypertension) Uncontrolled today, no red flags Increase amlodipine to 10 mg daily, continue losartan Continue Lasix  Tobacco abuse She started smoking again, approximately one pack per day He does not centimeters in quitting now, I counseled her and offered assistance.    Orders Placed This Encounter  Procedures  . Ambulatory referral to Cardiology    Referral Priority:  Routine  Referral Type:  Consultation    Referral Reason:  Specialty Services Required    Requested Specialty:  Cardiology    Number of Visits Requested:  1    Meds ordered this encounter  Medications  . furosemide (LASIX) 20 MG tablet    Sig: Take 1 tablet (20 mg total) by mouth daily.    Dispense:  90 tablet    Refill:  1  . amLODipine (NORVASC) 10 MG tablet    Sig: Take 1 tablet (10 mg total) by mouth daily.    Dispense:  90 tablet    Refill:  3

## 2013-08-26 NOTE — Patient Instructions (Signed)
Is great to see you today!  Continue your furosemide at the same dose, I have sent a referral to cardiology for you.

## 2013-08-26 NOTE — Assessment & Plan Note (Signed)
Uncontrolled today, no red flags Increase amlodipine to 10 mg daily, continue losartan Continue Lasix

## 2013-08-26 NOTE — Assessment & Plan Note (Signed)
Stable today, dry weight appears to be around 123-126 Continue Lasix 20 mg daily, recent BMP within normal limits Continue losartan, increase amlodipine  Will send to heart failure clinic for evaluation and guidance on treatment Recent presentation to the hospital was in florid heart failure, her echo then showed a normal EF, LVH, and did not comment on diastolic dysfunction although I suspect there is at least some component of diastolic dysfunction.

## 2013-08-26 NOTE — Assessment & Plan Note (Signed)
She started smoking again, approximately one pack per day He does not centimeters in quitting now, I counseled her and offered assistance.

## 2013-08-30 ENCOUNTER — Telehealth: Payer: Self-pay | Admitting: Family Medicine

## 2013-08-30 MED ORDER — LOSARTAN POTASSIUM 50 MG PO TABS: 50.0000 mg | ORAL_TABLET | Freq: Every day | ORAL | Status: DC

## 2013-08-30 NOTE — Telephone Encounter (Signed)
Refilled cozaar.   Angelica SinkSam Xitlalli Newhard, MD [facility] Resident, PGY-2 08/30/2013, 7:37 PM

## 2013-08-30 NOTE — Telephone Encounter (Signed)
Needs refill on cozaar Same pharmacy

## 2013-09-06 ENCOUNTER — Telehealth: Payer: Self-pay | Admitting: Family Medicine

## 2013-09-06 NOTE — Telephone Encounter (Signed)
Need refills on lorsartan potassium [facility] [address]

## 2013-09-06 NOTE — Telephone Encounter (Signed)
Will fwd to MD.  Ludie Pavlik L, CMA  

## 2013-09-07 MED ORDER — LOSARTAN POTASSIUM 50 MG PO TABS: 50.0000 mg | ORAL_TABLET | Freq: Every day | ORAL | Status: DC

## 2013-09-07 NOTE — Telephone Encounter (Signed)
Sent refill for Cozaar, Last one was printed by mistake.   Murtis SinkSam Keath Matera, MD [facility] Resident, PGY-2 09/07/2013, 8:51 AM

## 2013-09-07 NOTE — Telephone Encounter (Signed)
Called pt. LMVM to call back. Please tell pt Cozaar was sent to the pharmacy. Thanks. Lorenda Hatchet.Boneta Standre, Renato Battleshekla

## 2013-09-08 NOTE — Telephone Encounter (Signed)
LMVM that medication has been called in.  Calynn Ferrero, Darlyne RussianKristen L, CMA

## 2013-09-09 ENCOUNTER — Ambulatory Visit: Payer: Medicare Other | Admitting: Cardiovascular Disease

## 2013-09-21 ENCOUNTER — Ambulatory Visit: Payer: Medicare Other | Admitting: Family Medicine

## 2013-09-22 ENCOUNTER — Encounter: Payer: Self-pay | Admitting: Cardiovascular Disease

## 2013-09-22 ENCOUNTER — Ambulatory Visit (INDEPENDENT_AMBULATORY_CARE_PROVIDER_SITE_OTHER): Payer: Medicare Other | Admitting: Cardiovascular Disease

## 2013-09-22 VITALS — BP 142/80 | HR 78 | Ht 67.5 in | Wt 127.1 lb

## 2013-09-22 DIAGNOSIS — J449 Chronic obstructive pulmonary disease, unspecified: Secondary | ICD-10-CM

## 2013-09-22 DIAGNOSIS — R0989 Other specified symptoms and signs involving the circulatory and respiratory systems: Secondary | ICD-10-CM

## 2013-09-22 DIAGNOSIS — R1900 Intra-abdominal and pelvic swelling, mass and lump, unspecified site: Secondary | ICD-10-CM | POA: Diagnosis not present

## 2013-09-22 DIAGNOSIS — I5032 Chronic diastolic (congestive) heart failure: Secondary | ICD-10-CM | POA: Diagnosis not present

## 2013-09-22 DIAGNOSIS — I509 Heart failure, unspecified: Secondary | ICD-10-CM

## 2013-09-22 NOTE — Assessment & Plan Note (Signed)
She has a long history of cigarette smoking. There is evidence of emphysema on the CT scan. I suspect that severe instant emphysema may be as much because of her shortness breath as the possibility of diastolic dysfunction. This needs to be evaluated further. We need a full set of pulmonary function test. I suggest that she see a pulmonologist.

## 2013-09-22 NOTE — Assessment & Plan Note (Signed)
I was able to palpate her aorta. She has a history of cigarette smoking. We need to do an abdominal duplex scan to rule out an abdominal aortic aneurysm.

## 2013-09-22 NOTE — Patient Instructions (Signed)
Your physician has requested that you have an abdominal aorta duplex. During this test, an ultrasound is used to evaluate the aorta. Allow 30 minutes for this exam. Do not eat after midnight the day before and avoid carbonated beverages  Your physician recommends that you schedule a follow-up appointment in: 3 MONTHS   REDUCE HIGH SODIUM FOODS LIKE CANNED SOUP, GRAVY, SAUCES, READY PREPARED FOODS LIKE FROZEN FOODS; LEAN CUISINE, LASAGNA. BACON, SAUSAGE, LUNCH MEAT, FAST FOODS, HOT DOGS, CHIPS, PIZZA, CHINESE FOOD, SOY SAUCE, STORE BOUGHT FRIED CHICKEN= [place] FRIED CHICKEN/ BOJANGLES, V-8 JUICE.

## 2013-09-22 NOTE — Assessment & Plan Note (Signed)
Mrs. Angelica Mcbride presents with leg edema, history of severe hypertension, cigarette smoking. She was diagnosed with chronic diastolic congestive heart failure. Her echocardiogram shows that her systolic function is normal. She certainly may have some  diastolic dysfunction but I think the more likely issue is  that she has right heart failure due  to COPD. She has  mild LVH by echo. There Was no mention of diastolic dysfunction specifically.  She is much better on blood pressure medicines and Lasix.  We'll continue with her current medications. I strongly recommended that she see a pulmonologist. We need to get a full set of pulmonary function tests .

## 2013-09-22 NOTE — Progress Notes (Signed)
Angelica Mcbride Date of Birth  [birth year]       [facility] Office [address]  [address], [address] [place], [place]  [address]   [place], [place]  [address] [phone]    [phone]   Fax  [phone]     Fax [phone]  Problem List: 1.  chronic diastolic congestive heart failure 2. Hypertension 3. Tobacco abuse  History of Present Illness:  Angelica Mcbride is a 73 yo referred for further evaluation of dyspnea - presumed to be diastolic dysfunction. She has hx of HTN for 40-50 years.   She also smokes 1 ppd.    In January , 2015 She has had a chronic cough and has had leg swelling - started in the left leg and advanced to include the right leg also.  She went to [facility] and was admitted by the [facility] practice service.  She had not been to the doctor in quite a long time. She was not on any medications on admission. She was discharged on Lasix, Cozaar, and proper statin. She noticed that her chronic cough has resolved.  Her leg swelling has resolved.  She still  Is eating salt.      Current Outpatient Prescriptions on File Prior to Visit  Medication Sig Dispense Refill  . acetaminophen (TYLENOL) 500 MG tablet Take 500 mg by mouth every 8 (eight) hours as needed for mild pain.      Marland Kitchen amLODipine (NORVASC) 10 MG tablet Take 1 tablet (10 mg total) by mouth daily.  90 tablet  3  . aspirin 81 MG chewable tablet Chew 1 tablet (81 mg total) by mouth daily.  30 tablet  0  . furosemide (LASIX) 20 MG tablet Take 1 tablet (20 mg total) by mouth daily.  90 tablet  1  . guaiFENesin (MUCINEX) 600 MG 12 hr tablet Take 600 mg by mouth 2 (two) times daily as needed for to loosen phlegm.      Marland Kitchen losartan (COZAAR) 50 MG tablet Take 1 tablet (50 mg total) by mouth daily.  30 tablet  11  . pravastatin (PRAVACHOL) 40 MG tablet Take 1 tablet (40 mg total) by mouth daily.  90 tablet  3   No current facility-administered medications on file prior to  visit.    No Known Allergies  Past Medical History  Diagnosis Date  . COPD (chronic obstructive pulmonary disease)   . Shortness of breath   . CHF (congestive heart failure) 07/2013    ACUTE  . HTN (hypertension), malignant     HX OF  . Respiratory failure     No past surgical history on file.  History  Smoking status  . Current Every Day Smoker -- 1.00 packs/day for 51 years  . Types: Cigarettes  Smokeless tobacco  . Never Used    History  Alcohol Use No    Family History  Problem Relation Age of Onset  . Diabetes Daughter     Reviw of Systems:  Reviewed in the HPI.  All other systems are negative.  Physical Exam: Blood pressure 142/80, pulse 78, height 5' 7.5" (1.715 m), weight 127 lb 1.9 oz (57.661 kg). Wt Readings from Last 3 Encounters:  09/22/13 127 lb 1.9 oz (57.661 kg)  08/26/13 126 lb (57.153 kg)  08/02/13 123 lb (55.792 kg)     General: Well developed, well nourished, in no acute distress. Heart: Normocephalic, atraumatic, sclera non-icteric, mucus membranes are  moist,  Neck: Supple. Carotids are 2 + without bruits.   JV appears to be about normal Lungs: slightly decreased breath sounds - especially on right Heart: RR, normal S1S2 Abdomen:  + BS, + pulsitile abdominal aorta Msk:  Strength and tone are normal  Extremities: No clubbing or cyanosis. No edema.  Distal pedal pulses are 2+ and equal  Neuro: CN II - XII intact.  Alert and oriented X 3.  Psych:  Normal   ECG: Generate, 2015: Normal sinus rhythm. She has biatrial enlargement. His left ventricular hypertrophy with repolarization abnormality.   Echo:  Jan, 2015: Left ventricle: The cavity size was normal. Wall thickness was increased in a pattern of moderate LVH. The estimated ejection fraction was 55%. Regional wall motion abnormalities cannot be excluded. - Mitral valve: Mild regurgitation. - Left atrium: The atrium was mildly dilated. - Pulmonary arteries: PA peak pressure: 44mm  Hg (S). - Pericardium, extracardiac: A small to moderate pericardial effusion was identified posterior to the heart. There was no evidence of hemodynamic compromise.   Assessment / Plan:

## 2013-09-30 ENCOUNTER — Other Ambulatory Visit (HOSPITAL_COMMUNITY): Payer: Self-pay | Admitting: Cardiology

## 2013-09-30 DIAGNOSIS — I10 Essential (primary) hypertension: Secondary | ICD-10-CM

## 2013-10-01 ENCOUNTER — Ambulatory Visit: Payer: Medicare Other | Admitting: Family Medicine

## 2013-10-01 ENCOUNTER — Ambulatory Visit (HOSPITAL_COMMUNITY): Payer: Medicare Other | Attending: Cardiovascular Disease | Admitting: *Deleted

## 2013-10-01 DIAGNOSIS — I708 Atherosclerosis of other arteries: Secondary | ICD-10-CM | POA: Insufficient documentation

## 2013-10-01 DIAGNOSIS — F17200 Nicotine dependence, unspecified, uncomplicated: Secondary | ICD-10-CM | POA: Diagnosis not present

## 2013-10-01 DIAGNOSIS — J449 Chronic obstructive pulmonary disease, unspecified: Secondary | ICD-10-CM | POA: Diagnosis not present

## 2013-10-01 DIAGNOSIS — I700 Atherosclerosis of aorta: Secondary | ICD-10-CM | POA: Diagnosis not present

## 2013-10-01 DIAGNOSIS — J4489 Other specified chronic obstructive pulmonary disease: Secondary | ICD-10-CM | POA: Insufficient documentation

## 2013-10-01 DIAGNOSIS — I10 Essential (primary) hypertension: Secondary | ICD-10-CM | POA: Diagnosis not present

## 2013-10-01 NOTE — Progress Notes (Signed)
Visceral Aorta Duplex complete

## 2013-10-06 ENCOUNTER — Telehealth: Payer: Self-pay | Admitting: Cardiovascular Disease

## 2013-10-06 NOTE — Telephone Encounter (Signed)
RESULTS WERE REVIEWED.

## 2013-10-06 NOTE — Telephone Encounter (Signed)
New message          Pt returning nurses call about results

## 2013-10-29 ENCOUNTER — Ambulatory Visit (INDEPENDENT_AMBULATORY_CARE_PROVIDER_SITE_OTHER): Payer: Medicare Other | Admitting: Family Medicine

## 2013-10-29 ENCOUNTER — Encounter: Payer: Self-pay | Admitting: Family Medicine

## 2013-10-29 VITALS — BP 157/78 | HR 76 | Temp 98.3°F | Ht 67.5 in | Wt 137.0 lb

## 2013-10-29 DIAGNOSIS — I10 Essential (primary) hypertension: Secondary | ICD-10-CM | POA: Diagnosis not present

## 2013-10-29 DIAGNOSIS — I509 Heart failure, unspecified: Secondary | ICD-10-CM | POA: Diagnosis not present

## 2013-10-29 DIAGNOSIS — I5032 Chronic diastolic (congestive) heart failure: Secondary | ICD-10-CM | POA: Diagnosis not present

## 2013-10-29 NOTE — Patient Instructions (Signed)
Great to see you today!  Your weight is up so lets increase you lasix to 2 pills tomorrow.   If you dont get a normal urinary response increase to 4 pills the follow ing day.   If you dont have a good urinary response by Monday call and we'll switch you to a different medicine  Make an appt with Dr. Raymondo BandKoval for PFTs  Make and appt with me in 1 month for follow up CHF

## 2013-10-29 NOTE — Assessment & Plan Note (Signed)
Elevated Considering CHF goal should be 130/90 Patient and daughter report somnolence and malaise with bps in 130s systolic, will limit how aggressive i can be contyinue lasix, amlodipine, and losartan

## 2013-10-29 NOTE — Assessment & Plan Note (Addendum)
With leg edema and 10 lb wt gain today will increase diuretics for 3 days Inst reviewed increase Lasix to 40 mg and then to 80 mg daily for 3 days, titrating to adequate urinary response Advised patient to call in Monday if 80 mg Lasix by mouth is not causing diuresis Would change to 40 or 80 mg of torsemide at that time if still no response Discussed the weight of 130 pounds with this diuresis.  Agree with and appreciate Dr. Harvie BridgeNahser's help, arranged PFTs in our office, will plan to refer to pulm

## 2013-10-29 NOTE — Progress Notes (Signed)
Patient ID: Angelica RamsayJoan Mcbride, female   DOB: 1941/01/12, [age] y.o.   MRN: [id number]  Kevin FentonSamuel Bradshaw, MD Phone: [phone]  Subjective:  Chief complaint-noted  # f/u BP and CHF  BP:  130-170/60-85 at home, taking meds regularly No dyspnea, no cp, no palp + edema,   CHF No dyspnea but weight is up from 126 to 127 1 month ago,  Notes new edema Notes decreased urinary resp to lasix over last 1-2 weeks.  No PND or orthopnea but doesn't attempt laying flat  Doesn't want mammo or c scope b/c she states she would not want intervention if a cancer was found. I described simple polypectomy and she still declines.    ROS- No Fever + weight gain and edema No cough/dysopnea No n/v/d  Past Medical History Patient Active Problem List   Diagnosis Date Noted  . COPD (chronic obstructive pulmonary disease) 09/22/2013  . 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 Chronic diastolic congestive heart failure 08/26/2013  . Tobacco abuse 08/02/2013  . 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 HTN (hypertension) 07/19/2013    Medications- reviewed and updated Current Outpatient Prescriptions  Medication Sig Dispense Refill  . acetaminophen (TYLENOL) 500 MG tablet Take 500 mg by mouth every 8 (eight) hours as needed for mild pain.      Marland Kitchen. amLODipine (NORVASC) 10 MG tablet Take 1 tablet (10 mg total) by mouth daily.  90 tablet  3  . aspirin 81 MG chewable tablet Chew 1 tablet (81 mg total) by mouth daily.  30 tablet  0  . furosemide (LASIX) 20 MG tablet Take 1 tablet (20 mg total) by mouth daily.  90 tablet  1  . guaiFENesin (MUCINEX) 600 MG 12 hr tablet Take 600 mg by mouth 2 (two) times daily as needed for to loosen phlegm.      Marland Kitchen. losartan (COZAAR) 50 MG tablet Take 1 tablet (50 mg total) by mouth daily.  30 tablet  11  . pravastatin (PRAVACHOL) 40 MG tablet Take 1 tablet (40 mg total) by mouth daily.  90 tablet  3   No current facility-administered medications for this visit.    Objective: BP 157/78   Pulse 76  Temp(Src) 98.3 F (36.8 C) (Oral)  Ht 5' 7.5" (1.715 m)  Wt 137 lb (62.143 kg)  BMI 21.13 kg/m2 Gen: NAD, alert, cooperative with exam HEENT: NCAT CV: RRR, good S1/S2, no murmur Resp: Soft crackles at bl bases and decreased air movement throughout Ext: 2+ Pitting edema Bl Neuro: Alert and oriented, No gross deficits   Assessment/Plan:  HTN (hypertension) Elevated Considering CHF goal should be 130/90 Patient and daughter report somnolence and malaise with bps in 130s systolic, will limit how aggressive i can be contyinue lasix, amlodipine, and losartan  Chronic diastolic congestive heart failure With leg edema and 10 lb wt gain today will increase diuretics for 3 days Inst reviewed increase Lasix to 40 mg and then to 80 mg daily for 3 days, titrating to adequate urinary response Advised patient to call in Monday if 80 mg Lasix by mouth is not causing diuresis Would change to 40 or 80 mg of torsemide at that time if still no response Discussed the weight of 130 pounds with this diuresis.

## 2013-11-08 ENCOUNTER — Telehealth: Payer: Self-pay | Admitting: Family Medicine

## 2013-11-08 NOTE — Telephone Encounter (Signed)
According to patient's medication list, she should have plenty of refills at the pharmacy. LMVM for pt to call pharmacy for refills.  Radene OuKristen L Carmie Lanpher, CMA

## 2013-11-08 NOTE — Telephone Encounter (Signed)
Pt called and needs a refill on her Lasix 20 mg and also her other medications are running low. jw

## 2013-11-09 ENCOUNTER — Ambulatory Visit: Payer: Medicare Other | Admitting: Pharmacist

## 2013-11-18 ENCOUNTER — Ambulatory Visit (INDEPENDENT_AMBULATORY_CARE_PROVIDER_SITE_OTHER): Payer: Medicare Other | Admitting: Pharmacist

## 2013-11-18 ENCOUNTER — Encounter: Payer: Self-pay | Admitting: Pharmacist

## 2013-11-18 VITALS — BP 125/81 | HR 69 | Ht 66.54 in | Wt 140.5 lb

## 2013-11-18 DIAGNOSIS — Z720 Tobacco use: Secondary | ICD-10-CM

## 2013-11-18 DIAGNOSIS — J449 Chronic obstructive pulmonary disease, unspecified: Secondary | ICD-10-CM | POA: Diagnosis not present

## 2013-11-18 DIAGNOSIS — F17200 Nicotine dependence, unspecified, uncomplicated: Secondary | ICD-10-CM | POA: Diagnosis not present

## 2013-11-18 NOTE — Assessment & Plan Note (Signed)
Spirometry evaluation revealed moderate obstruction prior to medication with significant improvement in FVC/FEV1 post-medication. Lung age was determined to be 99 years. After seeing the spirometry results, the patient agreed to decrease smoking by 50% or 1/2 ppd by follow-up appointment on June 7th. Plan is to assess the initiation of new medications at next appointment based on degree of smoking cessation.  Given reversible disease patient may be a candidate for a Dulera or Advair.  Spiriva could also be considered.   Patient is a long-term smoker (51 years and 1ppd) who presented today for evaluation of lung function.  She was aware of the importance to quit, but she was not ready to quit. She believes she can quit at anytime and only smokes due to boredom. Willing to cut down on cigarettes to 10 or less per day in the next 30 days following 25 minutes of face to face counseling.   Reviewed results of pulmonary function tests.  Pt verbalized understanding of results and education.

## 2013-11-18 NOTE — Patient Instructions (Addendum)
Thank you for coming for your appointment today!  Goal for smoking cessation is to decrease amount by 50% or 1/2 ppd by next appointment on June 7th.   Try to become involved in alternative activities other than smoking to prevent boredom.

## 2013-11-18 NOTE — Assessment & Plan Note (Signed)
Patient is a long-term smoker (51 years and 1ppd) who presented today for evaluation of lung function.  She was aware of the importance to quit, but she was not ready to quit. She believes she can quit at anytime and only smokes due to boredom. Willing to cut down on cigarettes to 10 or less per day in the next 30 days following 25 minutes of face to face counseling.

## 2013-11-18 NOTE — Progress Notes (Signed)
S:    Patient arrives with daughter to clinic. Presents for lung function evaluation.   O: See "scanned report" or Documentation Flowsheet (discrete results - PFTs) for  Spirometry results. Patient provided good effort while attempting spirometry.   Lung Age = 99 years Albuterol Neb  Lot# [id number]     Exp. 06/2015  A/P: Spirometry evaluation revealed moderate obstruction prior to medication with significant improvement in FVC/FEV1 post-medication. Lung age was determined to be 99 years. After seeing the spirometry results, the patient agreed to decrease smoking by 50% or 1/2 ppd by follow-up appointment on June 7th. Plan is to assess the initiation of new medications at next appointment based on degree of smoking cessation.  Given reversible disease patient may be a candidate for a Dulera or Advair.  Spiriva could also be considered.   Patient is a long-term smoker (51 years and 1ppd) who presented today for evaluation of lung function.  She was aware of the importance to quit, but she was not ready to quit. She believes she can quit at anytime and only smokes due to boredom. Willing to cut down on cigarettes to 10 or less per day in the next 30 days following 25 minutes of face to face counseling.   Reviewed results of pulmonary function tests.  Pt verbalized understanding of results and education.  Written pt instructions provided.  F/U Clinic visit on June 7th. Total time in face to face counseling 40 minutes.  Patient seen with Sherie DonMichael Rollins, PharmD Candidate,  Jamse ArnKatie Felt,  PharmD Resident, Joyice FasterJonathan Binz PharmD Resident.  .Marland Kitchen

## 2013-11-22 NOTE — Progress Notes (Signed)
Patient ID: Angelica Mcbride, female   DOB: Jul 08, 1941, 73 y.o.   MRN: [id number] Reviewed: Agree with Dr. Macky LowerKoval's documentation and management.

## 2013-12-01 ENCOUNTER — Encounter: Payer: Self-pay | Admitting: Family Medicine

## 2013-12-01 ENCOUNTER — Ambulatory Visit (INDEPENDENT_AMBULATORY_CARE_PROVIDER_SITE_OTHER): Payer: Medicare Other | Admitting: Family Medicine

## 2013-12-01 VITALS — BP 149/87 | HR 90 | Temp 98.5°F | Wt 139.0 lb

## 2013-12-01 DIAGNOSIS — J449 Chronic obstructive pulmonary disease, unspecified: Secondary | ICD-10-CM

## 2013-12-01 DIAGNOSIS — Z720 Tobacco use: Secondary | ICD-10-CM

## 2013-12-01 DIAGNOSIS — I5032 Chronic diastolic (congestive) heart failure: Secondary | ICD-10-CM

## 2013-12-01 DIAGNOSIS — R51 Headache: Secondary | ICD-10-CM

## 2013-12-01 DIAGNOSIS — I509 Heart failure, unspecified: Secondary | ICD-10-CM

## 2013-12-01 DIAGNOSIS — F17200 Nicotine dependence, unspecified, uncomplicated: Secondary | ICD-10-CM

## 2013-12-01 DIAGNOSIS — I10 Essential (primary) hypertension: Secondary | ICD-10-CM | POA: Diagnosis not present

## 2013-12-01 DIAGNOSIS — R519 Headache, unspecified: Secondary | ICD-10-CM | POA: Insufficient documentation

## 2013-12-01 MED ORDER — FUROSEMIDE 40 MG PO TABS: 40.0000 mg | ORAL_TABLET | Freq: Every day | ORAL | Status: DC

## 2013-12-01 MED ORDER — ALBUTEROL SULFATE HFA 108 (90 BASE) MCG/ACT IN AERS: 2.0000 | INHALATION_SPRAY | Freq: Four times a day (QID) | RESPIRATORY_TRACT | Status: DC | PRN

## 2013-12-01 NOTE — Patient Instructions (Signed)
Take lasix 2 times daily for the next 5 days  Keep track of how much fluid you drink every day and limit yourself to 3/4 of that every day  I have prescribed an albuterol inhaler for you.

## 2013-12-01 NOTE — Assessment & Plan Note (Signed)
No weight loss since our last visit, it turns out that she is compensating for her diuresis with aggressive fluid intake. Will increase her Lasix to 40 mg Increase to Lasix 40 mg twice daily for the next 3 days I think it's reasonable to believe her when she states that she's been eating better since she left the hospital and so some of her weight gain may be genuine weight gain, rather than just fluid weight. However looking at her legs she is clearly fluid overloaded. Continue amlodipine, aspirin, losartan, and Lasix Has followup with cardiology next month, follow with me in 2 months

## 2013-12-01 NOTE — Assessment & Plan Note (Addendum)
Chronic occipital headache. Patient has not tried Tylenol and so we'll treat with Tylenol and watch her closely. 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 Considering this is been going on for months I don't think that she is having an acute bleed, on repeat CT in January it had resolved. She is on a statin and aspirin currently for stroke prevention Attempted call tonight but she did not answer Considering that this is not likely to be an acute bleed but she did have a previous bleed in that area would probably be reasonable to get an MRA/MRI head. I will plan on calling her tomorrow to discuss this possibility and discussed the pros and cons.

## 2013-12-01 NOTE — Assessment & Plan Note (Signed)
Elevated, but feel that I am slightly limited due to her decrease in mental status with a slightly lower blood pressure. At some point will consider increasing losartan to 100 mg daily which she'll probably tolerate after tolerating this blood pressure for several months. Continue Lasix, amlodipine, losartan

## 2013-12-01 NOTE — Assessment & Plan Note (Signed)
She is actively quitting, she is down to half pack per day. I offered her support and encouraged her to continue quitting.

## 2013-12-01 NOTE — Assessment & Plan Note (Signed)
Moderate obstruction, good reversibility on Spiriva maturing Prescribed albuterol, discussed that we'll start controller inhaler if she is needing this on a daily basis. Encouraged decreasing her tobacco use, she's decreased by half already. Congratulated her for that Follow up 2 months

## 2013-12-01 NOTE — Progress Notes (Signed)
Patient ID: Angelica RamsayJoan Mcpartland, female   DOB: 04-Mar-1941, 73 y.o.   MRN: [id number]  Kevin FentonSamuel Bradshaw, MD Phone: [phone]  Subjective:  Chief complaint-noted  Pt Here for lab CHF and hypertension  CHF States that since last time she was here she had some issues with running out of Lasix. She's been taking 40 mg of Lasix by taking 220 mg tablets. She states that she has good diuresis with 40 mg of Lasix daily. She states that whenever she has aggressive diuresis she drinks more. She denies orthopnea, PND She has some bilateral lower extremity edema  Hypertension She's compliant with her medications and does not check her blood pressure at home. She denies chest pain and palpitations She does have dyspnea which is at baseline, she also has lower extremity edema  Headaches States that she has an occipital left-sided headache nearly every morning when she wakes up. She's not tried Tylenol for this. She describes it as a mild dull aching headache that goes away after several hours. She denies dysarthria, numbness or tingling on one part of the body, change in vision  Dyspnea, COPD, smoking She was previously smoking one pack a day and is now down to half pack a day When she was taking Spiriva maturing she felt like the albuterol really helped and would like a prescription. She feels that her breathing is at baseline now.  ROS-  Per history of present illness No fever, chills, sweats Positive dyspnea  Past Medical History Patient Active Problem List   Diagnosis Date Noted  . Headache(784.0) 12/01/2013  . COPD (chronic obstructive pulmonary disease) 09/22/2013  . 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 Chronic diastolic congestive heart failure 08/26/2013  . Tobacco abuse 08/02/2013  . 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 HTN (hypertension) 07/19/2013    Medications- reviewed and updated Current Outpatient Prescriptions  Medication Sig Dispense Refill  . acetaminophen (TYLENOL) 500  MG tablet Take 500 mg by mouth every 8 (eight) hours as needed for mild pain.      Marland Kitchen. albuterol (PROVENTIL HFA;VENTOLIN HFA) 108 (90 BASE) MCG/ACT inhaler Inhale 2 puffs into the lungs every 6 (six) hours as needed for wheezing or shortness of breath.  1 Inhaler  2  . amLODipine (NORVASC) 5 MG tablet Take 5 mg by mouth daily.      Marland Kitchen. aspirin 81 MG chewable tablet Chew 1 tablet (81 mg total) by mouth daily.  30 tablet  0  . furosemide (LASIX) 40 MG tablet Take 1 tablet (40 mg total) by mouth daily.  90 tablet  4  . losartan (COZAAR) 50 MG tablet Take 1 tablet (50 mg total) by mouth daily.  30 tablet  11  . pravastatin (PRAVACHOL) 40 MG tablet Take 1 tablet (40 mg total) by mouth daily.  90 tablet  3   No current facility-administered medications for this visit.    Objective: BP 149/87  Pulse 90  Temp(Src) 98.5 F (36.9 C) (Oral)  Wt 139 lb (63.05 kg) Gen: NAD, alert, cooperative with exam HEENT: NCAT CV: RRR, good S1/S2, no murmur Resp: CTABL, no wheezes, non-labored but decreased air movement air movement Ext: 2+ edema on bilateral lower extremities Neuro: Alert and oriented, No gross deficits   Assessment/Plan:  Chronic diastolic congestive heart failure No weight loss since our last visit, it turns out that she is compensating for her diuresis with aggressive fluid intake. Will increase her Lasix to 40 mg Increase to Lasix 40 mg twice daily for the next 3 days  I think it's reasonable to believe her when she states that she's been eating better since she left the hospital and so some of her weight gain may be genuine weight gain, rather than just fluid weight. However looking at her legs she is clearly fluid overloaded. Continue amlodipine, aspirin, losartan, and Lasix Has followup with cardiology next month, follow with me in 2 months  COPD (chronic obstructive pulmonary disease) Moderate obstruction, good reversibility on Spiriva maturing Prescribed albuterol, discussed that  we'll start controller inhaler if she is needing this on a daily basis. Encouraged decreasing her tobacco use, she's decreased by half already. Congratulated her for that Follow up 2 months  HTN (hypertension) Elevated, but feel that I am slightly limited due to her decrease in mental status with a slightly lower blood pressure. At some point will consider increasing losartan to 100 mg daily which she'll probably tolerate after tolerating this blood pressure for several months. Continue Lasix, amlodipine, losartan  Headache(784.0) Chronic occipital headache. Patient has not tried Tylenol and so we'll treat with Tylenol and watch her closely. 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 Considering this is been going on for months I don't think that she is having an acute bleed, on repeat CT in January it had resolved. She is on a statin and aspirin currently for stroke prevention Considering that this is not likely to be an acute bleed but she did have a previous bleed in that area would probably be reasonable to get an MRA/MRI head. I will plan on calling her tomorrow to discuss this possibility and discussed the pros and cons.  Tobacco abuse She is actively quitting, she is down to half pack per day. I offered her support and encouraged her to continue quitting.     Meds ordered this encounter  Medications  . albuterol (PROVENTIL HFA;VENTOLIN HFA) 108 (90 BASE) MCG/ACT inhaler    Sig: Inhale 2 puffs into the lungs every 6 (six) hours as needed for wheezing or shortness of breath.    Dispense:  1 Inhaler    Refill:  2  . furosemide (LASIX) 40 MG tablet    Sig: Take 1 tablet (40 mg total) by mouth daily.    Dispense:  90 tablet    Refill:  4

## 2013-12-02 ENCOUNTER — Telehealth: Payer: Self-pay | Admitting: Family Medicine

## 2013-12-02 NOTE — Telephone Encounter (Signed)
Called to discuss HA and need for possible MRA/MRI, no Answer, left VM. Will call back later.   Again I don't think this is an acute bleed and so we have plenty of time to discuss it. A reasonable option would be top treat and watch and wait and so we will discuss that when I can catch her on the phone.   Murtis SinkSam Latonya Nelon, MD [facility] Resident, PGY-2 12/02/2013, 10:56 AM

## 2013-12-03 ENCOUNTER — Telehealth: Payer: Self-pay | Admitting: Family Medicine

## 2013-12-03 NOTE — Telephone Encounter (Signed)
Called to discuss headache and old bleed with patient. Sh ehas had complete improvement with tylenol. I discussed that since this headache had been going on since the hospital its possible that the small bleed can be contributing to the pain but a large new bleed is very unlikely.   She would like to wait on pursuing MRI/MRA at this time.   I discussed in detail reasons to seek immediate medical attention and she voiced understanding.   Murtis Sink, MD [facility] Resident, PGY-2 12/03/2013, 12:24 PM

## 2014-01-05 ENCOUNTER — Encounter: Payer: Self-pay | Admitting: Cardiovascular Disease

## 2014-01-05 ENCOUNTER — Ambulatory Visit (INDEPENDENT_AMBULATORY_CARE_PROVIDER_SITE_OTHER): Payer: Medicare Other | Admitting: Cardiovascular Disease

## 2014-01-05 VITALS — BP 144/80 | HR 76 | Ht 66.5 in | Wt 145.4 lb

## 2014-01-05 DIAGNOSIS — I509 Heart failure, unspecified: Secondary | ICD-10-CM | POA: Diagnosis not present

## 2014-01-05 DIAGNOSIS — I5032 Chronic diastolic (congestive) heart failure: Secondary | ICD-10-CM | POA: Diagnosis not present

## 2014-01-05 NOTE — Patient Instructions (Signed)
Your physician recommends that you continue on your current medications as directed. Please refer to the Current Medication list given to you today.  Your physician wants you to follow-up in: 1 year with Dr. Nahser.  You will receive a reminder letter in the mail two months in advance. If you don't receive a letter, please call our office to schedule the follow-up appointment.  

## 2014-01-05 NOTE — Assessment & Plan Note (Signed)
She has a dx of chronic diastolic CHF but I think most of her symptoms are due to COPD.    She still eats salt and still smokes.  I will see her in 1 year.

## 2014-01-05 NOTE — Progress Notes (Signed)
Angelica RamsayJoan Gaffey Date of Birth  [birth year]       [facility]    [place] [address], [address]  [address], [address] [place], [place]  [address]   [place], [place]  [id number] [phone]    [phone]   Fax  [phone]     Fax [phone]  Problem List: 1.  chronic diastolic congestive heart failure 2. Hypertension 3. Tobacco abuse  History of Present Illness:  Angelica Mcbride is a 73 yo referred for further evaluation of dyspnea - presumed to be diastolic dysfunction. She has hx of HTN for 40-50 years.   She also smokes 1 ppd.    In January , 2015 She has had a chronic cough and has had leg swelling - started in the left leg and advanced to include the right leg also.  [facility]  She had not been to the doctor in quite a long time. She was not on any medications on admission. She was discharged on Lasix, Cozaar, and proper statin. She noticed that her chronic cough has resolved.  Her leg swelling has resolved.  She still  Is eating salt.   January 05, 2014:   Her abdominal aortic duplex scan revealed moderate degree of lateral plaque. There is no evidence of aortic dissection. She has not seen a pulmonary doctor.  Her breathing is about the same.  No extra dyspnea. Still smoking.   She is not able to walk much -  Limited by leg fatigue.     Current Outpatient Prescriptions on File Prior to Visit  Medication Sig Dispense Refill  . acetaminophen (TYLENOL) 500 MG tablet Take 500 mg by mouth every 8 (eight) hours as needed for mild pain.      Marland Kitchen. albuterol (PROVENTIL HFA;VENTOLIN HFA) 108 (90 BASE) MCG/ACT inhaler Inhale 2 puffs into the lungs every 6 (six) hours as needed for wheezing or shortness of breath.  1 Inhaler  2  . amLODipine (NORVASC) 5 MG tablet Take 5 mg by mouth daily.      Marland Kitchen. aspirin 81 MG chewable tablet Chew 1 tablet (81 mg total) by mouth daily.  30 tablet  0  . furosemide  (LASIX) 40 MG tablet Take 1 tablet (40 mg total) by mouth daily.  90 tablet  4  . losartan (COZAAR) 50 MG tablet Take 1 tablet (50 mg total) by mouth daily.  30 tablet  11  . pravastatin (PRAVACHOL) 40 MG tablet Take 1 tablet (40 mg total) by mouth daily.  90 tablet  3   No current facility-administered medications on file prior to visit.    No Known Allergies  Past Medical History  Diagnosis Date  . COPD (chronic obstructive pulmonary disease)   . Shortness of breath   . CHF (congestive heart failure) 07/2013    ACUTE  . HTN (hypertension), malignant     HX OF  . Respiratory failure     No past surgical history on file.  History  Smoking status  . Current Every Day Smoker -- 1.00 packs/day for 51 years  . Types: Cigarettes  . Start date: 07/15/1961  Smokeless tobacco  . Never Used    History  Alcohol Use No    Family History  Problem Relation Age of Onset  . Diabetes Daughter     Reviw of Systems:  Reviewed in the HPI.  All other systems are negative.  Physical Exam: Blood pressure 144/80, pulse 76, height 5' 6.5" (1.689 m), weight 145 lb 6.4 oz (65.953 kg). Wt Readings from Last 3 Encounters:  01/05/14 145 lb 6.4 oz (65.953 kg)  12/01/13 139 lb (63.05 kg)  11/18/13 140 lb 8 oz (63.73 kg)     General: Well developed, well nourished, in no acute distress. Heart: Normocephalic, atraumatic, sclera non-icteric, mucus membranes are moist,  Neck: Supple. Carotids are 2 + without bruits.   JV appears to be about normal Lungs: slightly decreased breath sounds - especially on right Heart: RR, normal S1S2 Abdomen:  + BS, + pulsitile abdominal aorta Msk:  Strength and tone are normal  Extremities: No clubbing or cyanosis. No edema.  Distal pedal pulses are 2+ and equal  Neuro: CN II - XII intact.  Alert and oriented X 3.  Psych:  Normal   ECG: Generate, 2015: Normal sinus rhythm. She has biatrial enlargement. His left ventricular hypertrophy with repolarization  abnormality.   Echo:  Jan, 2015: Left ventricle: The cavity size was normal. Wall thickness was increased in a pattern of moderate LVH. The estimated ejection fraction was 55%. Regional wall motion abnormalities cannot be excluded. - Mitral valve: Mild regurgitation. - Left atrium: The atrium was mildly dilated. - Pulmonary arteries: PA peak pressure: 44mm Hg (S). - Pericardium, extracardiac: A small to moderate pericardial effusion was identified posterior to the heart. There was no evidence of hemodynamic compromise.   Assessment / Plan:

## 2014-06-21 ENCOUNTER — Ambulatory Visit (INDEPENDENT_AMBULATORY_CARE_PROVIDER_SITE_OTHER): Payer: Medicare Other | Admitting: *Deleted

## 2014-06-21 ENCOUNTER — Encounter: Payer: Self-pay | Admitting: Family Medicine

## 2014-06-21 ENCOUNTER — Ambulatory Visit (INDEPENDENT_AMBULATORY_CARE_PROVIDER_SITE_OTHER): Payer: Medicare Other | Admitting: Family Medicine

## 2014-06-21 VITALS — BP 117/70 | HR 90 | Temp 98.7°F | Ht 67.0 in | Wt 158.6 lb

## 2014-06-21 DIAGNOSIS — I5032 Chronic diastolic (congestive) heart failure: Secondary | ICD-10-CM

## 2014-06-21 DIAGNOSIS — J410 Simple chronic bronchitis: Secondary | ICD-10-CM | POA: Diagnosis not present

## 2014-06-21 DIAGNOSIS — I10 Essential (primary) hypertension: Secondary | ICD-10-CM

## 2014-06-21 DIAGNOSIS — Z23 Encounter for immunization: Secondary | ICD-10-CM | POA: Diagnosis not present

## 2014-06-21 DIAGNOSIS — Z720 Tobacco use: Secondary | ICD-10-CM

## 2014-06-21 DIAGNOSIS — Z0000 Encounter for general adult medical examination without abnormal findings: Secondary | ICD-10-CM | POA: Diagnosis not present

## 2014-06-21 MED ORDER — LOSARTAN POTASSIUM 50 MG PO TABS: 50.0000 mg | ORAL_TABLET | Freq: Every day | ORAL | Status: DC

## 2014-06-21 NOTE — Assessment & Plan Note (Signed)
Well controlled Continue losartan, norvasc, and lasix labs

## 2014-06-21 NOTE — Assessment & Plan Note (Signed)
Contemplative, smoking 2 puffs of 1 ppd

## 2014-06-21 NOTE — Progress Notes (Signed)
Patient ID: Angelica RamsayJoan Mcbride, female   DOB: 12/01/1940, 73 y.o.   MRN: [id number]   HPI  Patient presents today for follow-up   Hypertension Taking her meds everyday Denies dyspnea, chest pain, palpitations Stable leg edema Not really watching her diet  Heart failure Gets good brisk diuresis with Lasix No orthopnea but does not lay flat due to dizziness with laying completely flat and turning her head to the left. Taking meds as prescribed  COPD Still smoking, states that she is only smoking about 2 puffs a cigarette but still smoking 1 pack per day Uses albuterol very rarely Does not complain of any dyspnea and denies it mostly. Has cough which is much more improved than it was prior to hospitalization.  Tobacco abuse-one pack per day as above  Smoking status noted ROS: Per HPI  Objective: BP 117/70 mmHg  Pulse 90  Temp(Src) 98.7 F (37.1 C) (Oral)  Ht 5\' 7"  (1.702 m)  Wt 158 lb 9.6 oz (71.94 kg)  BMI 24.83 kg/m2 Gen: NAD, alert, cooperative with exam HEENT: NCAT CV: RRR, good S1/S2, no murmur Resp: Nonlabored, expiratory wheezes throughout, no crackles Ext: 1+ leg edema bilaterally to midcalf Neuro: Alert and oriented, No gross deficits  Assessment and plan:  Tobacco abuse Contemplative, smoking 2 puffs of 1 ppd  HTN (hypertension) Well controlled Continue losartan, norvasc, and lasix labs  COPD (chronic obstructive pulmonary disease) Reasonable control Moderate by spirometry, offered controller which she doesn't want now   Chronic diastolic congestive heart failure Euvolemic to slightly hypervolemic, asymptomatic Continue aspirin, statin, losartan, Lasix Labs  Healthcare maintenance Flu shot today    Orders Placed This Encounter  Procedures  . CBC with Differential  . Comprehensive metabolic panel    Meds ordered this encounter  Medications  . losartan (COZAAR) 50 MG tablet    Sig: Take 1 tablet (50 mg total) by mouth daily.    Dispense:   90 tablet    Refill:  3

## 2014-06-21 NOTE — Assessment & Plan Note (Signed)
Euvolemic to slightly hypervolemic, asymptomatic Continue aspirin, statin, losartan, Lasix Labs

## 2014-06-21 NOTE — Assessment & Plan Note (Signed)
Reasonable control Moderate by spirometry, offered controller which she doesn't want now

## 2014-06-21 NOTE — Assessment & Plan Note (Signed)
Flu shot today 

## 2014-06-21 NOTE — Patient Instructions (Signed)
Great to see you!  I will send a letter with your lab results if they are normal  Call for any refill needs  Follow up in 6 months

## 2014-06-22 ENCOUNTER — Ambulatory Visit: Payer: Medicare Other | Admitting: Family Medicine

## 2014-06-22 ENCOUNTER — Encounter: Payer: Self-pay | Admitting: Family Medicine

## 2014-06-22 LAB — CBC WITH DIFFERENTIAL/PLATELET
BASOS ABS: 0 10*3/uL (ref 0.0–0.1)
Basophils Relative: 0 % (ref 0–1)
EOS PCT: 2 % (ref 0–5)
Eosinophils Absolute: 0.1 10*3/uL (ref 0.0–0.7)
HEMATOCRIT: 39.3 % (ref 36.0–46.0)
Hemoglobin: 13.3 g/dL (ref 12.0–15.0)
LYMPHS ABS: 2.2 10*3/uL (ref 0.7–4.0)
Lymphocytes Relative: 33 % (ref 12–46)
MCH: 28.4 pg (ref 26.0–34.0)
MCHC: 33.8 g/dL (ref 30.0–36.0)
MCV: 83.8 fL (ref 78.0–100.0)
Monocytes Absolute: 0.7 10*3/uL (ref 0.1–1.0)
Monocytes Relative: 11 % (ref 3–12)
NEUTROS PCT: 54 % (ref 43–77)
Neutro Abs: 3.7 10*3/uL (ref 1.7–7.7)
PLATELETS: 177 10*3/uL (ref 150–400)
RBC: 4.69 MIL/uL (ref 3.87–5.11)
RDW: 16.1 % — ABNORMAL HIGH (ref 11.5–15.5)
WBC: 6.8 10*3/uL (ref 4.0–10.5)

## 2014-06-22 LAB — COMPREHENSIVE METABOLIC PANEL
ALT: 10 U/L (ref 0–35)
AST: 17 U/L (ref 0–37)
Albumin: 4 g/dL (ref 3.5–5.2)
Alkaline Phosphatase: 97 U/L (ref 39–117)
BUN: 17 mg/dL (ref 6–23)
CHLORIDE: 104 meq/L (ref 96–112)
CO2: 29 meq/L (ref 19–32)
CREATININE: 0.84 mg/dL (ref 0.50–1.10)
Calcium: 9.3 mg/dL (ref 8.4–10.5)
Glucose, Bld: 104 mg/dL — ABNORMAL HIGH (ref 70–99)
Potassium: 3.7 mEq/L (ref 3.5–5.3)
SODIUM: 138 meq/L (ref 135–145)
TOTAL PROTEIN: 7.1 g/dL (ref 6.0–8.3)
Total Bilirubin: 0.3 mg/dL (ref 0.2–1.2)

## 2014-08-10 ENCOUNTER — Other Ambulatory Visit: Payer: Self-pay | Admitting: *Deleted

## 2014-08-10 DIAGNOSIS — I613 Nontraumatic intracerebral hemorrhage in brain stem: Secondary | ICD-10-CM

## 2014-08-10 DIAGNOSIS — Z720 Tobacco use: Secondary | ICD-10-CM

## 2014-08-10 DIAGNOSIS — I10 Essential (primary) hypertension: Secondary | ICD-10-CM

## 2014-08-10 MED ORDER — AMLODIPINE BESYLATE 5 MG PO TABS: 5.0000 mg | ORAL_TABLET | Freq: Every day | ORAL | Status: DC

## 2014-08-10 MED ORDER — PRAVASTATIN SODIUM 40 MG PO TABS: 40.0000 mg | ORAL_TABLET | Freq: Every day | ORAL | Status: DC

## 2014-11-08 ENCOUNTER — Encounter (HOSPITAL_COMMUNITY): Payer: Self-pay | Admitting: Emergency Medicine

## 2014-11-08 ENCOUNTER — Emergency Department (INDEPENDENT_AMBULATORY_CARE_PROVIDER_SITE_OTHER)
Admission: EM | Admit: 2014-11-08 | Discharge: 2014-11-08 | Disposition: A | Discharge status: Home / Self Care | Payer: Medicare Other | Source: Home / Self Care

## 2014-11-08 DIAGNOSIS — L600 Ingrowing nail: Secondary | ICD-10-CM | POA: Diagnosis not present

## 2014-11-08 DIAGNOSIS — B351 Tinea unguium: Secondary | ICD-10-CM

## 2014-11-08 MED ORDER — MUPIROCIN 2 % EX OINT: 1.0000 "application " | TOPICAL_OINTMENT | Freq: Two times a day (BID) | CUTANEOUS | Status: DC

## 2014-11-08 NOTE — ED Notes (Signed)
C/o ingrown toenails States feet does hurt No tx tried Patient would like her toe nails cut down

## 2014-11-08 NOTE — ED Provider Notes (Signed)
CSN: [id number]     Arrival date & time 11/08/14  1712 History   None    Chief Complaint  Patient presents with  . Ingrown Toenail   (Consider location/radiation/quality/duration/timing/severity/associated sxs/prior Treatment) HPI  All toenails have become "irritated." Started January 2016. Also become thickened and are growing very long. Patient is tried some ibuprofen for the irritated feeling. The right great toe is occasionally painful when the toenail hit something. Denies any purulent discharge, effusions, fevers, rash. Problem is constantly getting worse.   Past Medical History  Diagnosis Date  . COPD (chronic obstructive pulmonary disease)   . Shortness of breath   . CHF (congestive heart failure) 07/2013    ACUTE  . HTN (hypertension), malignant     HX OF  . Respiratory failure    History reviewed. No pertinent past surgical history. Family History  Problem Relation Age of Onset  . Diabetes Daughter    History  Substance Use Topics  . Smoking status: Current Every Day Smoker -- 1.00 packs/day for 51 years    Types: Cigarettes    Start date: 07/15/1961  . Smokeless tobacco: Never Used  . Alcohol Use: No   OB History    No data available     Review of Systems Per HPI with all other pertinent systems negative.   Allergies  Review of patient's allergies indicates no known allergies.  Home Medications   Prior to Admission medications   Medication Sig Start Date End Date Taking? Authorizing Provider  acetaminophen (TYLENOL) 500 MG tablet Take 500 mg by mouth every 8 (eight) hours as needed for mild pain.    Historical Provider, MD  albuterol (PROVENTIL HFA;VENTOLIN HFA) 108 (90 BASE) MCG/ACT inhaler Inhale 2 puffs into the lungs every 6 (six) hours as needed for wheezing or shortness of breath. 12/01/13   Elenora GammaSamuel L Bradshaw, MD  amLODipine (NORVASC) 5 MG tablet Take 1 tablet (5 mg total) by mouth daily. 08/10/14   Elenora GammaSamuel L Bradshaw, MD  aspirin 81 MG chewable  tablet Chew 1 tablet (81 mg total) by mouth daily. 07/29/13   Tyrone Nineyan B Grunz, MD  furosemide (LASIX) 40 MG tablet Take 1 tablet (40 mg total) by mouth daily. 12/01/13   Elenora GammaSamuel L Bradshaw, MD  losartan (COZAAR) 50 MG tablet Take 1 tablet (50 mg total) by mouth daily. 06/21/14   Elenora GammaSamuel L Bradshaw, MD  mupirocin ointment (BACTROBAN) 2 % Apply 1 application topically 2 (two) times daily. 11/08/14   Ozella Rocksavid J Kassia Demarinis, MD  pravastatin (PRAVACHOL) 40 MG tablet Take 1 tablet (40 mg total) by mouth daily. 08/10/14   Elenora GammaSamuel L Bradshaw, MD   BP 151/87 mmHg  Pulse 81  Temp(Src) 97 F (36.1 C) (Oral)  Resp 18  SpO2 95% Physical Exam Physical Exam  Constitutional: oriented to person, place, and time. appears well-developed and well-nourished. No distress.  HENT:  Head: Normocephalic and atraumatic.  Eyes: EOMI. PERRL.  Neck: Normal range of motion.  Cardiovascular: RRR, no m/r/g, 2+ distal pulses,  Pulmonary/Chest: Effort normal and breath sounds normal. No respiratory distress.  Abdominal: Soft. Bowel sounds are normal. NonTTP, no distension.  Musculoskeletal: Normal range of motion. Non ttp, no effusion.  all toenails thickened and white, right great toe with the lateral edge butting up against the toenail open and tender without purulence, or induration.  Neurological: alert and oriented to person, place, and time.  Skin: Skin is warm. No rash noted. non diaphoretic.  Psychiatric: normal mood and affect. behavior is normal.  Judgment and thought content normal.   ED Course  Procedures (including critical care time) Labs Review Labs Reviewed - No data to display  Imaging Review No results found.  Using medical grade scissors, all 5 toes were trimmed. No bleeding present after trimming. Patient very satisfied with trimmed his toenails. Anabolic ointment applied to lower aspect of right great toe.  MDM   1. Ingrown toenail   2. Onychomycosis of toenail    warm daily soapy soaks, apply Bactroban  ointment to the lateral aspect of the great toe daily. Patient to try Vicks VapoRub every night on the toes, but will likely need oral antifungals in order to improve and patient follow-up with primary care physician or P podiatry.    Ozella Rocks, MD 11/08/14 [phone]

## 2014-11-08 NOTE — Discharge Instructions (Signed)
Your toenails are infected with fungus. This was causing him to be so thick and brittle. These were trimmed in our clinic and antibiotic ointment was applied to the edge of your toe. Most of your pain is from trauma to the toe from the toenail. Please soak her feet daily and warm soapy water and apply the anabolic ointment to the painful area. Please apply Vicks VapoRub to your toenails every night before bed please make a follow-up appointment with your primary care physician in 1-2 weeks for further management of your toenails.

## 2014-11-25 ENCOUNTER — Ambulatory Visit (INDEPENDENT_AMBULATORY_CARE_PROVIDER_SITE_OTHER): Payer: Medicare Other | Admitting: Family Medicine

## 2014-11-25 ENCOUNTER — Encounter: Payer: Self-pay | Admitting: Family Medicine

## 2014-11-25 VITALS — BP 115/71 | HR 94 | Temp 98.1°F | Ht 67.0 in | Wt 166.2 lb

## 2014-11-25 DIAGNOSIS — Z720 Tobacco use: Secondary | ICD-10-CM | POA: Diagnosis not present

## 2014-11-25 DIAGNOSIS — J410 Simple chronic bronchitis: Secondary | ICD-10-CM | POA: Diagnosis not present

## 2014-11-25 DIAGNOSIS — R0989 Other specified symptoms and signs involving the circulatory and respiratory systems: Secondary | ICD-10-CM

## 2014-11-25 DIAGNOSIS — I5032 Chronic diastolic (congestive) heart failure: Secondary | ICD-10-CM | POA: Diagnosis not present

## 2014-11-25 DIAGNOSIS — I10 Essential (primary) hypertension: Secondary | ICD-10-CM

## 2014-11-25 NOTE — Assessment & Plan Note (Signed)
Controlled on losartan, amlodipine, and Lasix Continue current meds Labs annually, next visit

## 2014-11-25 NOTE — Progress Notes (Signed)
Patient ID: Angelica Mcbride, female   DOB: [birth year], 74 y.o.   MRN: [id number]   HPI  Patient presents today for follow-up  Hypertension Taking all her meds, headaches, chest pain, palpitations or leg edema Dyspnea stable, still smoking  Heart failure Take an aspirin and statin, as well as Lasix with brisk diuresis No orthopnea, no leg swelling She has history of diastolic CHF, however is in the hospital clinically volume overloaded and improved with diuresis  COPD, smoking Previous surgery showed moderate obstruction with good response albuterol Still smoking about one pack per day, previously smoked 3 packs per day   Smoking status noted- current smoker ROS: Per HPI  Objective: BP 115/71 mmHg  Pulse 94  Temp(Src) 98.1 F (36.7 C) (Oral)  Ht 5\' 7"  (1.702 m)  Wt 166 lb 3.2 oz (75.388 kg)  BMI 26.02 kg/m2 Gen: NAD, alert, cooperative with exam HEENT: NCAT CV: RRR, good S1/S2, no murmur Resp: CTABL, no wheezes, non-labored Ext: No edema, warm Neuro: Alert and oriented, No gross deficits  Assessment and plan:  HTN (hypertension) Controlled on losartan, amlodipine, and Lasix Continue current meds Labs annually, next visit   Chronic diastolic congestive heart failure Euvolemic On our first meeting she was being admitted to the hospital and was very volume overloaded so we continued diuresis on her She seems to be tolerating it well and has brisk diuresis from her current dose of Lasix   COPD (chronic obstructive pulmonary disease) Still smoking, has not really tried albuterol Can't afford Advair or Dulera Encouraged her to try albuterol to see if that helps her symptoms Previous spirometry shows moderate obstruction with good response to albuterol   Palpable abdominal aorta Normal caliber aorta after evaluation with venous duplex at cardiology   Tobacco abuse She is pre-contemplative, encouraged

## 2014-11-25 NOTE — Assessment & Plan Note (Signed)
Normal caliber aorta after evaluation with venous duplex at cardiology

## 2014-11-25 NOTE — Assessment & Plan Note (Signed)
Still smoking, has not really tried albuterol Can't afford Advair or Dulera Encouraged her to try albuterol to see if that helps her symptoms Previous spirometry shows moderate obstruction with good response to albuterol

## 2014-11-25 NOTE — Assessment & Plan Note (Signed)
Euvolemic On our first meeting she was being admitted to the hospital and was very volume overloaded so we continued diuresis on her She seems to be tolerating it well and has brisk diuresis from her current dose of Lasix

## 2014-11-25 NOTE — Assessment & Plan Note (Signed)
She is pre-contemplative, encouraged

## 2014-11-25 NOTE — Patient Instructions (Signed)
Great to see you!  Lets see you back in 6 months, we'll plan on doing labs then.   Smoking Cessation Quitting smoking is important to your health and has many advantages. However, it is not always easy to quit since nicotine is a very addictive drug. Oftentimes, people try 3 times or more before being able to quit. This document explains the best ways for you to prepare to quit smoking. Quitting takes hard work and a lot of effort, but you can do it. ADVANTAGES OF QUITTING SMOKING  You will live longer, feel better, and live better.  Your body will feel the impact of quitting smoking almost immediately.  Within 20 minutes, blood pressure decreases. Your pulse returns to its normal level.  After 8 hours, carbon monoxide levels in the blood return to normal. Your oxygen level increases.  After 24 hours, the chance of having a heart attack starts to decrease. Your breath, hair, and body stop smelling like smoke.  After 48 hours, damaged nerve endings begin to recover. Your sense of taste and smell improve.  After 72 hours, the body is virtually free of nicotine. Your bronchial tubes relax and breathing becomes easier.  After 2 to 12 weeks, lungs can hold more air. Exercise becomes easier and circulation improves.  The risk of having a heart attack, stroke, cancer, or lung disease is greatly reduced.  After 1 year, the risk of coronary heart disease is cut in half.  After 5 years, the risk of stroke falls to the same as a nonsmoker.  After 10 years, the risk of lung cancer is cut in half and the risk of other cancers decreases significantly.  After 15 years, the risk of coronary heart disease drops, usually to the level of a nonsmoker.  If you are pregnant, quitting smoking will improve your chances of having a healthy baby.  The people you live with, especially any children, will be healthier.  You will have extra money to spend on things other than cigarettes. QUESTIONS TO THINK  ABOUT BEFORE ATTEMPTING TO QUIT You may want to talk about your answers with your health care provider.  Why do you want to quit?  If you tried to quit in the past, what helped and what did not?  What will be the most difficult situations for you after you quit? How will you plan to handle them?  Who can help you through the tough times? Your family? Friends? A health care provider?  What pleasures do you get from smoking? What ways can you still get pleasure if you quit? Here are some questions to ask your health care provider:  How can you help me to be successful at quitting?  What medicine do you think would be best for me and how should I take it?  What should I do if I need more help?  What is smoking withdrawal like? How can I get information on withdrawal? GET READY  Set a quit date.  Change your environment by getting rid of all cigarettes, ashtrays, matches, and lighters in your home, car, or work. Do not let people smoke in your home.  Review your past attempts to quit. Think about what worked and what did not. GET SUPPORT AND ENCOURAGEMENT You have a better chance of being successful if you have help. You can get support in many ways.  Tell your family, friends, and coworkers that you are going to quit and need their support. Ask them not to smoke around  you.  Get individual, group, or telephone counseling and support. Programs are available at [facility] and [facility]. Call your local health department for information about programs in your area.  Spiritual beliefs and practices may help some smokers quit.  Download a "quit meter" on your computer to keep track of quit statistics, such as how long you have gone without smoking, cigarettes not smoked, and money saved.  Get a self-help book about quitting smoking and staying off tobacco. LEARN NEW SKILLS AND BEHAVIORS  Distract yourself from urges to smoke. Talk to someone, go for a walk, or occupy your  time with a task.  Change your normal routine. Take a different route to work. Drink tea instead of coffee. Eat breakfast in a different place.  Reduce your stress. Take a hot bath, exercise, or read a book.  Plan something enjoyable to do every day. Reward yourself for not smoking.  Explore interactive web-based programs that specialize in helping you quit. GET MEDICINE AND USE IT CORRECTLY Medicines can help you stop smoking and decrease the urge to smoke. Combining medicine with the above behavioral methods and support can greatly increase your chances of successfully quitting smoking.  Nicotine replacement therapy helps deliver nicotine to your body without the negative effects and risks of smoking. Nicotine replacement therapy includes nicotine gum, lozenges, inhalers, nasal sprays, and skin patches. Some may be available over-the-counter and others require a prescription.  Antidepressant medicine helps people abstain from smoking, but how this works is unknown. This medicine is available by prescription.  Nicotinic receptor partial agonist medicine simulates the effect of nicotine in your brain. This medicine is available by prescription. Ask your health care provider for advice about which medicines to use and how to use them based on your health history. Your health care provider will tell you what side effects to look out for if you choose to be on a medicine or therapy. Carefully read the information on the package. Do not use any other product containing nicotine while using a nicotine replacement product.  RELAPSE OR DIFFICULT SITUATIONS Most relapses occur within the first 3 months after quitting. Do not be discouraged if you start smoking again. Remember, most people try several times before finally quitting. You may have symptoms of withdrawal because your body is used to nicotine. You may crave cigarettes, be irritable, feel very hungry, cough often, get headaches, or have  difficulty concentrating. The withdrawal symptoms are only temporary. They are strongest when you first quit, but they will go away within 10-14 days. To reduce the chances of relapse, try to:  Avoid drinking alcohol. Drinking lowers your chances of successfully quitting.  Reduce the amount of caffeine you consume. Once you quit smoking, the amount of caffeine in your body increases and can give you symptoms, such as a rapid heartbeat, sweating, and anxiety.  Avoid smokers because they can make you want to smoke.  Do not let weight gain distract you. Many smokers will gain weight when they quit, usually less than 10 pounds. Eat a healthy diet and stay active. You can always lose the weight gained after you quit.  Find ways to improve your mood other than smoking. FOR MORE INFORMATION  www.smokefree.gov  Document Released: 06/25/2001 Document Revised: 11/15/2013 Document Reviewed: 10/10/2011 [facility] Patient Information 2015 [employer] [place]. This information is not intended to replace advice given to you by your health care provider. Make sure you discuss any questions you have with your health care provider.

## 2014-11-29 IMAGING — CT CT ANGIO CHEST
2 of 8 series · 18 of 46 positions shown · IV contrast (APPLIED)
Comparison: Chest x-ray 07/21/2013

CLINICAL DATA: Congestive heart failure. Worsening COPD. Rule out
pulmonary embolism.

EXAM:
CT ANGIOGRAPHY CHEST WITH CONTRAST
TECHNIQUE: Multidetector CT imaging of the chest was performed using the
standard protocol during bolus administration of intravenous
contrast. Multiplanar CT image reconstructions including MIPs were
obtained to evaluate the vascular anatomy.
CONTRAST:  80mL OMNIPAQUE IOHEXOL 350 MG/ML SOLN

[Series 6: thins · axial · 0.58mm/px · z∈[-242,+26]mm · 15 of 296 slices shown]
[im 14/296  lung]
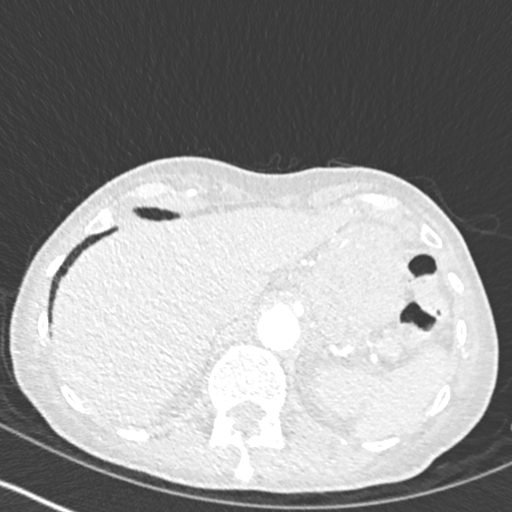
[im 41/296  soft-tissue]
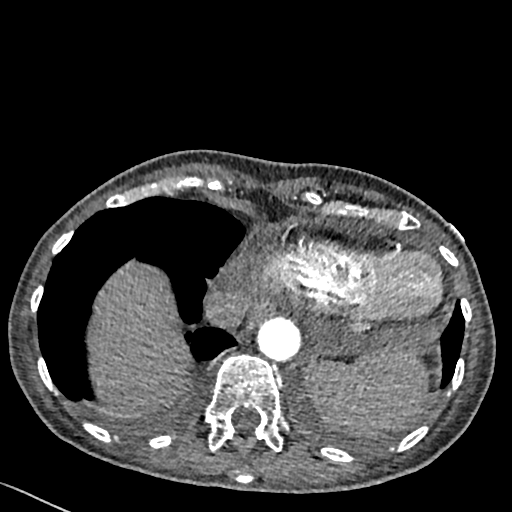
[im 54/296  lung]
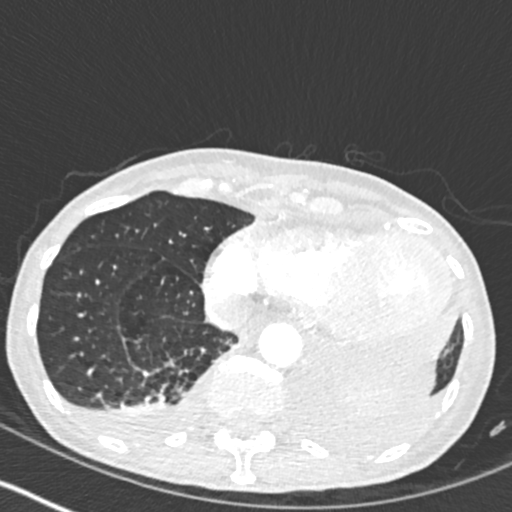
[im 68/296  soft-tissue]
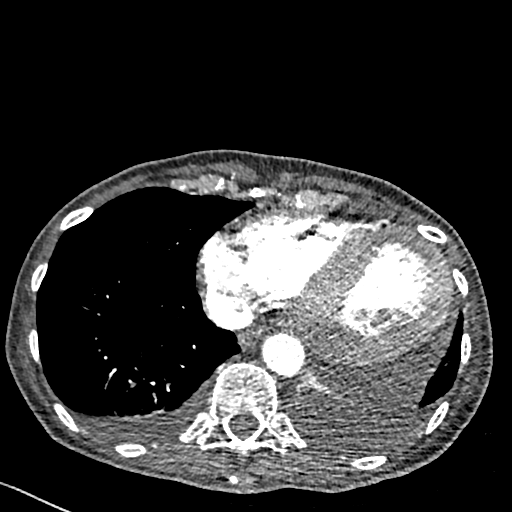
[im 94/296  lung]
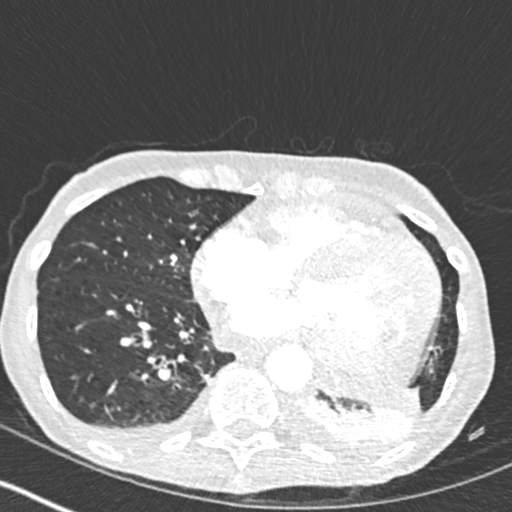
[im 108/296  soft-tissue]
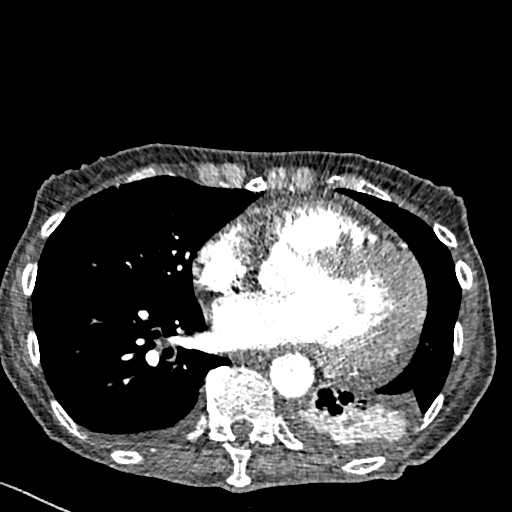
[im 135/296  lung]
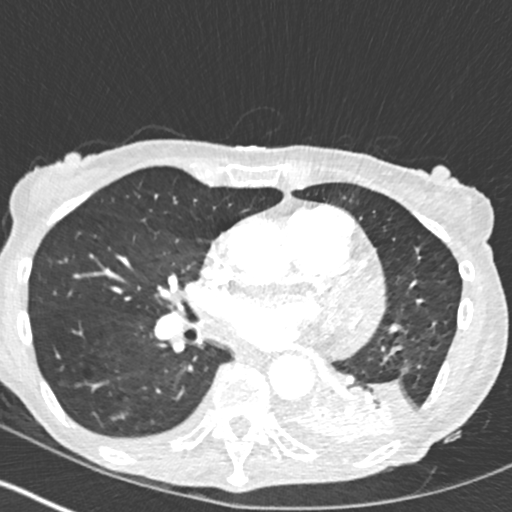
[im 148/296  soft-tissue]
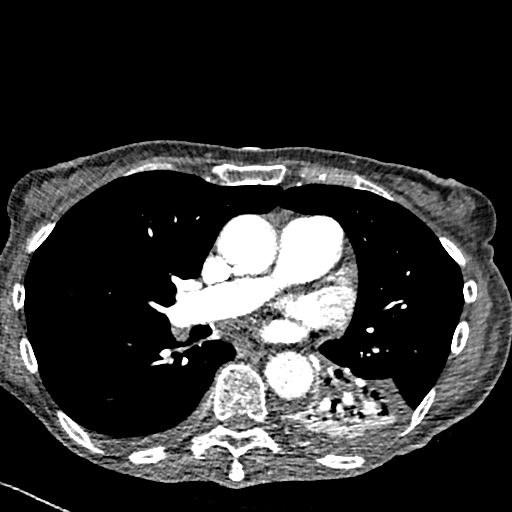
[im 161/296  lung]
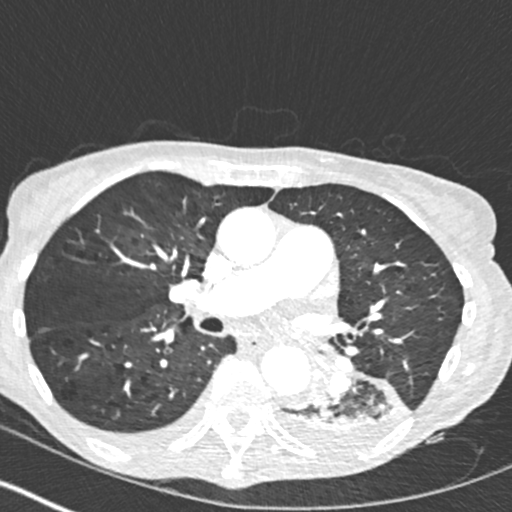
[im 188/296  soft-tissue]
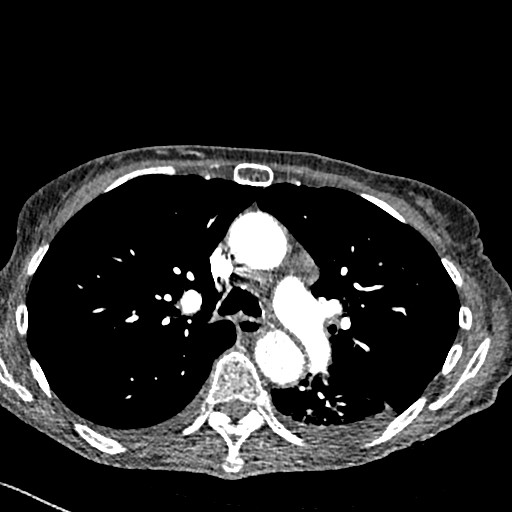
[im 202/296  lung]
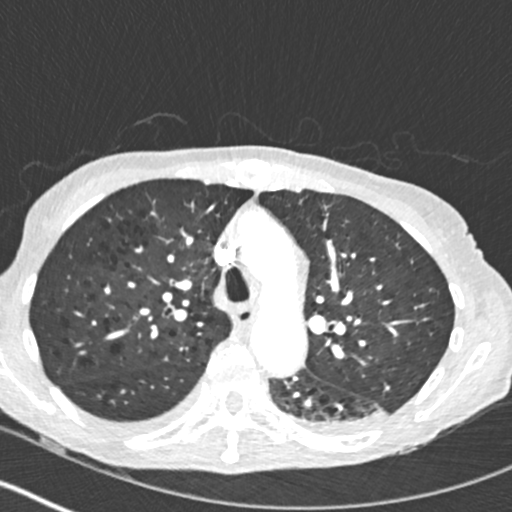
[im 228/296  soft-tissue]
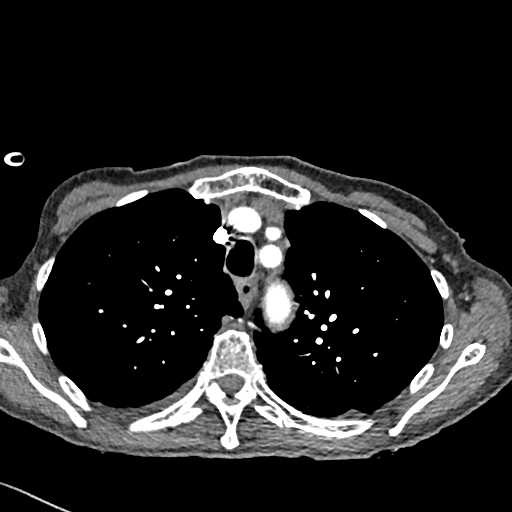
[im 242/296  lung]
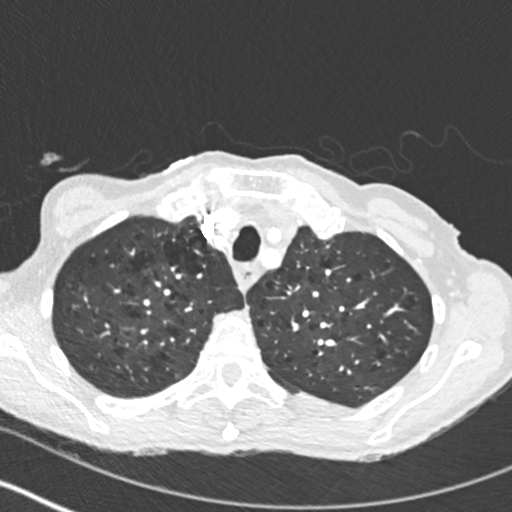
[im 255/296  soft-tissue]
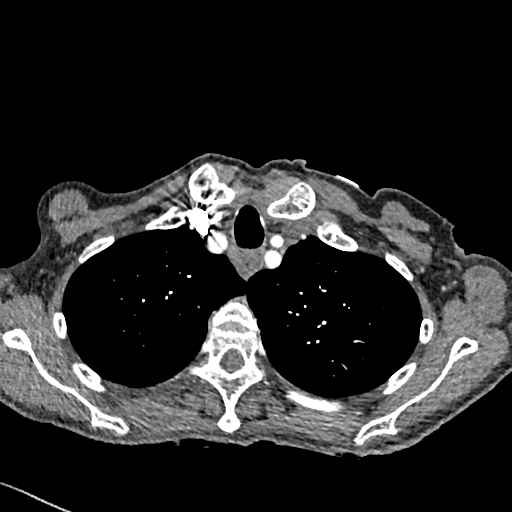
[im 282/296  lung]
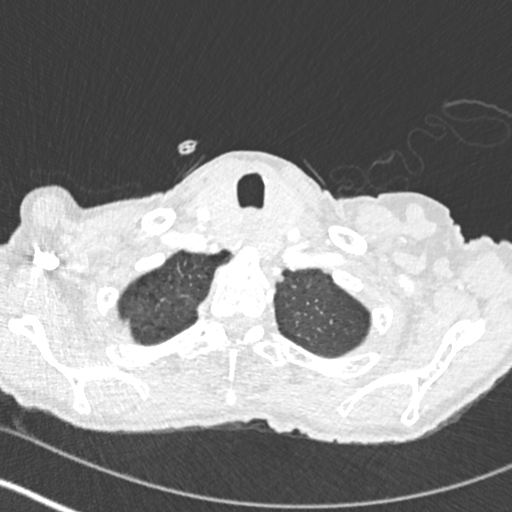

[Series 8: coronal mpr · coronal · 0.51mm/px · 3 of 93 slices shown]
[im 24/93  soft-tissue]
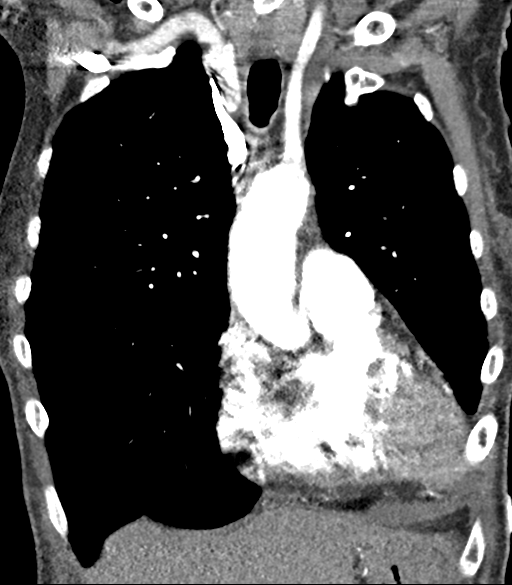
[im 47/93  soft-tissue]
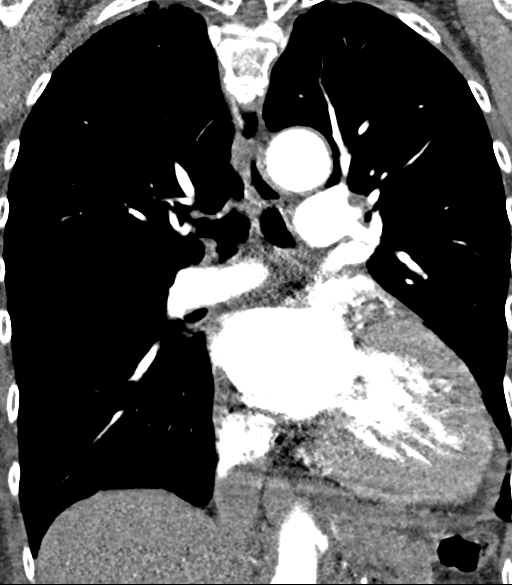
[im 70/93  soft-tissue]
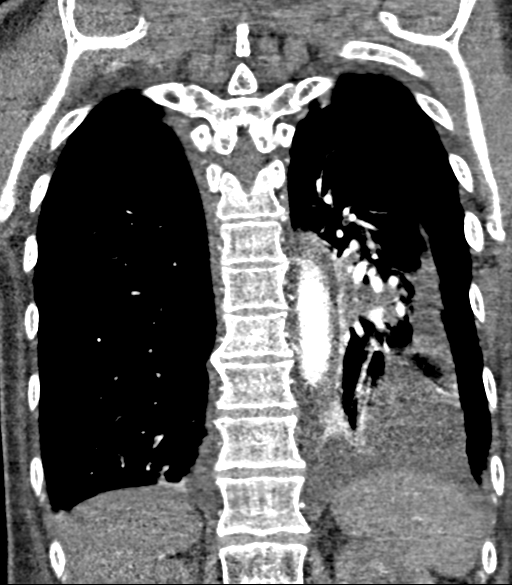

[18 of 46 positions shown; findings below may reference images not displayed]

FINDINGS: Lungs are adequately inflated with mild emphysematous disease. There
are small bilateral pleural effusions left worse than right with
associated compressive atelectasis in the left base. There is a
mm nodule over the right middle lobe. There is mild cardiomegaly.
There is a small pericardial effusion. The pulmonary arterial system
is within normal without evidence of emboli. There is no significant
hilar, mediastinal or axillary adenopathy. There is very minimal
calcification over the left anterior descending coronary artery.

Limited images through the upper abdomen demonstrate a sub cm
hypodensity over the dome of the liver likely a small cyst or
hemangioma. Remainder of the exam is unremarkable.

Review of the MIP images confirms the above findings.
IMPRESSION: No evidence of pulmonary embolism.

Small pericardial effusion with mild cardiomegaly.

Mild emphysematous disease. Small bilateral pleural effusions left
greater than right with associated compressive atelectasis in the
left base.

4.5 mm nodule over the right middle lobe. Recommend follow-up
noncontrast chest CT in 6 months. This recommendation follows the
consensus statement: Guidelines for Management of Small Pulmonary
Nodules Detected on CT Scans: A Statement from the [employer]
[URL]

Sub cm hypodensity over the dome of the liver likely a cyst or
hemangioma.

## 2015-02-10 ENCOUNTER — Other Ambulatory Visit: Payer: Self-pay | Admitting: *Deleted

## 2015-02-10 DIAGNOSIS — I5032 Chronic diastolic (congestive) heart failure: Secondary | ICD-10-CM

## 2015-02-13 MED ORDER — FUROSEMIDE 40 MG PO TABS: 40.0000 mg | ORAL_TABLET | Freq: Every day | ORAL | Status: DC

## 2015-02-14 MED ORDER — FUROSEMIDE 40 MG PO TABS: 40.0000 mg | ORAL_TABLET | Freq: Every day | ORAL | Status: DC

## 2015-02-14 NOTE — Telephone Encounter (Signed)
Resent Rx for Furosemide 40 mg #90 electronically.  Previous order stated print.  Clovis Pu, RN

## 2015-02-14 NOTE — Addendum Note (Signed)
Addended by: Clovis Pu on: 02/14/2015 02:25 PM   Modules accepted: Orders

## 2015-05-12 ENCOUNTER — Other Ambulatory Visit: Payer: Self-pay | Admitting: *Deleted

## 2015-05-12 DIAGNOSIS — I5032 Chronic diastolic (congestive) heart failure: Secondary | ICD-10-CM

## 2015-05-14 MED ORDER — LOSARTAN POTASSIUM 50 MG PO TABS: 50.0000 mg | ORAL_TABLET | Freq: Every day | ORAL | Status: DC

## 2015-05-14 MED ORDER — FUROSEMIDE 40 MG PO TABS: 40.0000 mg | ORAL_TABLET | Freq: Every day | ORAL | Status: DC

## 2015-06-02 ENCOUNTER — Encounter: Payer: Self-pay | Admitting: Internal Medicine

## 2015-06-02 ENCOUNTER — Ambulatory Visit (INDEPENDENT_AMBULATORY_CARE_PROVIDER_SITE_OTHER): Payer: Medicare Other | Admitting: Internal Medicine

## 2015-06-02 VITALS — BP 143/89 | HR 96 | Temp 98.2°F | Wt 165.0 lb

## 2015-06-02 DIAGNOSIS — I5032 Chronic diastolic (congestive) heart failure: Secondary | ICD-10-CM

## 2015-06-02 DIAGNOSIS — J42 Unspecified chronic bronchitis: Secondary | ICD-10-CM | POA: Diagnosis not present

## 2015-06-02 DIAGNOSIS — I509 Heart failure, unspecified: Secondary | ICD-10-CM | POA: Diagnosis not present

## 2015-06-02 DIAGNOSIS — Z23 Encounter for immunization: Secondary | ICD-10-CM

## 2015-06-02 DIAGNOSIS — R7303 Prediabetes: Secondary | ICD-10-CM | POA: Insufficient documentation

## 2015-06-02 DIAGNOSIS — I10 Essential (primary) hypertension: Secondary | ICD-10-CM

## 2015-06-02 DIAGNOSIS — Z720 Tobacco use: Secondary | ICD-10-CM

## 2015-06-02 LAB — LIPID PANEL
CHOL/HDL RATIO: 3 ratio (ref <=5.0)
CHOLESTEROL: 119 mg/dL — AB (ref 125–200)
HDL: 40 mg/dL — ABNORMAL LOW (ref >=46)
LDL CALC: 66 mg/dL (ref <130)
TRIGLYCERIDES: 67 mg/dL (ref <150)
VLDL: 13 mg/dL (ref <30)

## 2015-06-02 LAB — BASIC METABOLIC PANEL WITH GFR
BUN: 10 mg/dL (ref 7–25)
CALCIUM: 8.8 mg/dL (ref 8.6–10.4)
CO2: 26 mmol/L (ref 20–31)
Chloride: 103 mmol/L (ref 98–110)
Creat: 0.92 mg/dL (ref 0.60–0.93)
GFR, EST AFRICAN AMERICAN: 71 mL/min (ref >=60)
GFR, EST NON AFRICAN AMERICAN: 62 mL/min (ref >=60)
Glucose, Bld: 93 mg/dL (ref 65–99)
POTASSIUM: 4 mmol/L (ref 3.5–5.3)
Sodium: 141 mmol/L (ref 135–146)

## 2015-06-02 MED ORDER — ASPIRIN 81 MG PO CHEW: 81.0000 mg | CHEWABLE_TABLET | Freq: Every day | ORAL | Status: DC

## 2015-06-02 MED ORDER — AMLODIPINE BESYLATE 5 MG PO TABS: 5.0000 mg | ORAL_TABLET | Freq: Every day | ORAL | Status: DC

## 2015-06-02 MED ORDER — LOSARTAN POTASSIUM 50 MG PO TABS: 50.0000 mg | ORAL_TABLET | Freq: Every day | ORAL | Status: DC

## 2015-06-02 MED ORDER — FUROSEMIDE 40 MG PO TABS: 40.0000 mg | ORAL_TABLET | Freq: Every day | ORAL | Status: DC

## 2015-06-02 MED ORDER — ATORVASTATIN CALCIUM 40 MG PO TABS: 40.0000 mg | ORAL_TABLET | Freq: Every day | ORAL | Status: DC

## 2015-06-02 NOTE — Progress Notes (Signed)
Patient ID: Angelica RamsayJoan Hafen, female   DOB: 12-Jan-1941, 74 y.o.   MRN: [id number]   [facility] Noralee CharsAsiyah Reynalda Canny, MD Phone: [phone]  Reason For Visit: Medication Refill, General Check-in  Generally patient feel tired. She state that she goes to [employer] and takes credits such as Juvenile justice to keep mind busy.   # Essential HTN HYPERTENSION Disease Monitoring: Blood pressure range, does not regularly check blood pressure  Dyspnea, patient short of breath when she has walk long distances   Denies Chest pain, palpitations, lightheadedness, syncope. No swelling in legs Medications: Takes Amlodipine and Losartan every day.   # Prediabetes:  Patient had A1C 6.3  07/20/2013. Denies any hx of previous elevated blood sugars.    # COPD Disease monitoring Patient estimate of control, well controlled  Patient FEV of 54% of  PFTs. However she does not use any controller inhalers. She states that she does not have the medications from these. Patient does at times have wheezing but general does not really exercise all that much. She states for the most part she lives a pretty sedentary lifestyle. Denies any significant cough. Last COPD exacerbation was in January 2015   #CHF, Diastolic  Patient denies any current leg swelling or shortness of breath. Patient was seen by cardiology in June 2015. ECHO with preserved EF. Patient takes Lasix daily and feels like this has helped her the most with her breathing. SOB with activity. Not while at rest.  # Tobacco Abuse 50 pack year hx. Still currently smoking   PMH - Smoking status noted.    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  - Currently on amlodopine 5 mg and Cozaar 50 mg daily  - Will continue to follow blood pressure, asked patient to record blood pressure at home and bring in these recordings at next appointment  - BMET and Lipid Panel today  - Switched patient to Atorvastatin from pravastatin for high intensity statin therapy (ASCVD risk 19 %)     Chronic diastolic congestive heart failure HFpEF, ECHO (2015) "Cant not rule out wall motion abnormalites," but no diastolic dysfunction acutally noted in ECHO report. No lower extremity edema. Seen by cardiology in 2015, stating likely more COPD than HF  - Continue lasix, patient states it improves her breathing and wants to continue  - Will get BMET to check K     Tobacco abuse Tobacco Abuse  - Not currently interested in smoking cessation      Prediabetes Prediabetes HA1C 6.3  -  Random Glucose  - Will try to get HA1C at next visit to monitor

## 2015-06-02 NOTE — Patient Instructions (Signed)
High Blood Pressure Your BP is 140/80 controlled Your goal is to have a BP average < 142/89  Medicine Changes: None  Homework: Check Blood pressure, record those  Call if you have shortness of breath.   Come back to see us in: 6 months

## 2015-06-02 NOTE — Assessment & Plan Note (Signed)
Tobacco Abuse  - Not currently interested in smoking cessation

## 2015-06-02 NOTE — Assessment & Plan Note (Signed)
Prediabetes HA1C 6.3  -  Random Glucose  - Will try to get HA1C at next visit to monitor

## 2015-06-02 NOTE — Assessment & Plan Note (Signed)
HFpEF, ECHO (2015) "Cant not rule out wall motion abnormalites," but no diastolic dysfunction acutally noted in ECHO report. No lower extremity edema. Seen by cardiology in 2015, stating likely more COPD than HF  - Continue lasix, patient states it improves her breathing and wants to continue  - Will get BMET to check K

## 2015-06-02 NOTE — Assessment & Plan Note (Signed)
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  - Currently on amlodopine 5 mg and Cozaar 50 mg daily  - Will continue to follow blood pressure, asked patient to record blood pressure at home and bring in these recordings at next appointment  - BMET and Lipid Panel today  - Switched patient to Atorvastatin from pravastatin for high intensity statin therapy (ASCVD risk 19 %)

## 2015-06-08 ENCOUNTER — Encounter: Payer: Self-pay | Admitting: Internal Medicine

## 2015-08-14 ENCOUNTER — Encounter: Payer: Self-pay | Admitting: Osteopathic Medicine

## 2015-08-14 ENCOUNTER — Ambulatory Visit (INDEPENDENT_AMBULATORY_CARE_PROVIDER_SITE_OTHER): Payer: Medicare Other | Admitting: Osteopathic Medicine

## 2015-08-14 VITALS — BP 132/73 | HR 88 | Ht 67.0 in | Wt 164.0 lb

## 2015-08-14 DIAGNOSIS — I10 Essential (primary) hypertension: Secondary | ICD-10-CM | POA: Insufficient documentation

## 2015-08-14 DIAGNOSIS — J449 Chronic obstructive pulmonary disease, unspecified: Secondary | ICD-10-CM

## 2015-08-14 DIAGNOSIS — Z5181 Encounter for therapeutic drug level monitoring: Secondary | ICD-10-CM

## 2015-08-14 DIAGNOSIS — M259 Joint disorder, unspecified: Secondary | ICD-10-CM

## 2015-08-14 DIAGNOSIS — Z780 Asymptomatic menopausal state: Secondary | ICD-10-CM

## 2015-08-14 DIAGNOSIS — R7301 Impaired fasting glucose: Secondary | ICD-10-CM

## 2015-08-14 DIAGNOSIS — R7303 Prediabetes: Secondary | ICD-10-CM | POA: Diagnosis not present

## 2015-08-14 DIAGNOSIS — I509 Heart failure, unspecified: Secondary | ICD-10-CM

## 2015-08-14 DIAGNOSIS — M899 Disorder of bone, unspecified: Secondary | ICD-10-CM

## 2015-08-14 DIAGNOSIS — E559 Vitamin D deficiency, unspecified: Secondary | ICD-10-CM

## 2015-08-14 HISTORY — DX: Chronic obstructive pulmonary disease, unspecified: J44.9

## 2015-08-14 HISTORY — DX: Prediabetes: R73.03

## 2015-08-14 HISTORY — DX: Heart failure, unspecified: I50.9

## 2015-08-14 HISTORY — DX: Essential (primary) hypertension: I10

## 2015-08-14 MED ORDER — ASPIRIN 81 MG PO TABS: 81.0000 mg | ORAL_TABLET | Freq: Every day | ORAL | Status: DC

## 2015-08-14 MED ORDER — LOSARTAN POTASSIUM 50 MG PO TABS: 50.0000 mg | ORAL_TABLET | Freq: Every day | ORAL | Status: DC

## 2015-08-14 MED ORDER — AMLODIPINE BESYLATE 5 MG PO TABS: 5.0000 mg | ORAL_TABLET | Freq: Every day | ORAL | Status: DC

## 2015-08-14 MED ORDER — PRAVASTATIN SODIUM 40 MG PO TABS: 40.0000 mg | ORAL_TABLET | Freq: Every day | ORAL | Status: DC

## 2015-08-14 MED ORDER — FUROSEMIDE 40 MG PO TABS: 40.0000 mg | ORAL_TABLET | Freq: Every day | ORAL | Status: DC

## 2015-08-14 NOTE — Progress Notes (Signed)
HPI: Angelica Mcbride is a 75 y.o. female who presents to [facility] today for chief complaint of:  Chief Complaint  Patient presents with  . Establish Care    "Continuing treatment for hypertension, CHF, COPD."     Cardiac: Patient has history of hypertension and CHF. Patient is on statin, aspirin, ARB, amlodipine, Lasix, but I do not see beta blocker on her list. Was previously in the hospital in 2015 and followed up by cardiology after that. Record review notes cardiology concerned about some diastolic congestive heart failure and more concern for COPD as cause of her symptoms.  Respiratory: Patient reports history of COPD, so that she was put on inhalers in the past however she is not interested in taking these, she doesn't think they were very helpful. When asked if she is able to accomplish everything she wants to in terms of mobility in taking care of herself without difficulty breathing, she states that she has no problems. She is still smoking.   Past medical, social and family history reviewed: Past Medical History  Diagnosis Date  . Chronic congestive heart failure (HCC) 08/14/2015  . Essential hypertension 08/14/2015  . Chronic obstructive pulmonary disease (HCC) 08/14/2015  . Prediabetes 08/14/2015   History reviewed. No pertinent past surgical history. Social History  Substance Use Topics  . Smoking status: Not on file  . Smokeless tobacco: Not on file  . Alcohol Use: Not on file   History reviewed. No pertinent family history.  Current Outpatient Prescriptions  Medication Sig Dispense Refill  . amLODipine (NORVASC) 5 MG tablet Take 5 mg by mouth daily.    Marland Kitchen aspirin 81 MG tablet Take 81 mg by mouth daily.    . furosemide (LASIX) 40 MG tablet Take 40 mg by mouth.    . losartan (COZAAR) 50 MG tablet Take 50 mg by mouth daily.    . pravastatin (PRAVACHOL) 40 MG tablet Take 40 mg by mouth daily.     No current facility-administered  medications for this visit.   No Known Allergies    Review of Systems: CONSTITUTIONAL:  No  fever, no chills, No  unintentional weight changes HEAD/EYES/EARS/NOSE/THROAT: No  headache, no vision change, no hearing change, No  sore throat, No  sinus pressure CARDIAC: No  chest pain, No  pressure, No palpitations, No  orthopnea RESPIRATORY: No  cough, No  shortness of breath/wheeze GASTROINTESTINAL: No  nausea, No  vomiting, No  abdominal pain, No  blood in stool, No  diarrhea, No  constipation  MUSCULOSKELETAL: No  myalgia/arthralgia GENITOURINARY: No  incontinence, No  abnormal genital bleeding/discharge SKIN: No  rash/wounds/concerning lesions HEM/ONC: No  easy bruising/bleeding, No  abnormal lymph node ENDOCRINE: No polyuria/polydipsia/polyphagia, No  heat/cold intolerance  NEUROLOGIC: No  weakness, No  dizziness, No  slurred speech PSYCHIATRIC: No  concerns with depression, No  concerns with anxiety, No sleep problems  Exam:  BP 132/73 mmHg  Pulse 88  Ht  (1.702 m)  Wt 164 lb (74.39 kg)  BMI 25.68 kg/m2 Constitutional: VS see above. General Appearance: alert, well-developed, well-nourished, NAD Eyes: Normal lids and conjunctive, non-icteric sclera, PERRLA Ears, Nose, Mouth, Throat: MMM, Normal external inspection ears/nares/mouth/lips/gums, TM normal bilaterally. Pharynx no erythema, no exudate.  Neck: No masses, trachea midline. No thyroid enlargement/tenderness/mass appreciated. No lymphadenopathy Respiratory: Normal respiratory effort. no wheeze, no rhonchi, no rales Cardiovascular: S1/S2 normal, no murmur, no rub/gallop auscultated. RRR.  No carotid bruit or JVD. No abdominal aortic bruit.  Pedal  pulse II/IV bilaterally DP and PT.  No lower extremity edema. Gastrointestinal: Nontender, no masses. No hepatomegaly, no splenomegaly. No hernia appreciated. Bowel sounds normal. Rectal exam deferred.  Musculoskeletal: Gait normal. No clubbing/cyanosis of digits.   Neurological: No cranial nerve deficit on limited exam. Motor and sensation intact and symmetric Skin: warm, dry, intact. No rash/ulcer. No concerning nevi or subq nodules on limited exam.   Psychiatric: Normal judgment/insight. Normal mood and affect. Oriented x3.    No results found for this or any previous visit (from the past 72 hour(s)).    ASSESSMENT/PLAN: Blood pressure controlled on current medications, patient states no complaints, advised that if she develops difficulty breathing which impacts her activities of daily living or her quality of life is worth talking about getting back on inhalers for better control but at this time the patient is not interested in such. Some concern for no beta blocker on lists in patient with CHF history however symptoms appear well controlled in record review states unlikely systolic issue and probably more concerned for COPD. And patient does not feel there is any need to change her medications at this time, will review records and revisit this issue at her annual wellness visit.  Chronic congestive heart failure, unspecified congestive heart failure type (HCC) - Plan: pravastatin (PRAVACHOL) 40 MG tablet, furosemide (LASIX) 40 MG tablet, losartan (COZAAR) 50 MG tablet, aspirin 81 MG tablet  Chronic obstructive pulmonary disease, unspecified COPD type (HCC)  Essential hypertension - Plan: amLODipine (NORVASC) 5 MG tablet  Prediabetes - Plan: Hemoglobin A1c  Postmenopausal - Plan: VITAMIN D 25 Hydroxy (Vit-D Deficiency, Fractures)  Medication monitoring encounter - Plan: CBC with Differential/Platelet, COMPLETE METABOLIC PANEL WITH GFR  Elevated fasting blood sugar - Plan: Hemoglobin A1c  Disease of bone and joint - Plan: VITAMIN D 25 Hydroxy (Vit-D Deficiency, Fractures)   Return in about 4 months (around 12/12/2015) for ROUTINE FOLLOWUP, ANNUAL WELLNESS EXAM.

## 2015-08-16 ENCOUNTER — Encounter: Payer: Self-pay | Admitting: Internal Medicine

## 2015-09-08 DIAGNOSIS — R7309 Other abnormal glucose: Secondary | ICD-10-CM | POA: Diagnosis not present

## 2015-09-08 DIAGNOSIS — Z780 Asymptomatic menopausal state: Secondary | ICD-10-CM | POA: Diagnosis not present

## 2015-09-08 DIAGNOSIS — Z5181 Encounter for therapeutic drug level monitoring: Secondary | ICD-10-CM | POA: Diagnosis not present

## 2015-09-08 DIAGNOSIS — R7303 Prediabetes: Secondary | ICD-10-CM | POA: Diagnosis not present

## 2015-09-08 DIAGNOSIS — M899 Disorder of bone, unspecified: Secondary | ICD-10-CM | POA: Diagnosis not present

## 2015-09-08 DIAGNOSIS — R7301 Impaired fasting glucose: Secondary | ICD-10-CM | POA: Diagnosis not present

## 2015-09-09 LAB — COMPLETE METABOLIC PANEL WITH GFR
ALT: 7 U/L (ref 6–29)
AST: 14 U/L (ref 10–35)
Albumin: 3.6 g/dL (ref 3.6–5.1)
Alkaline Phosphatase: 86 U/L (ref 33–130)
BILIRUBIN TOTAL: 0.4 mg/dL (ref 0.2–1.2)
BUN: 8 mg/dL (ref 7–25)
CALCIUM: 8.5 mg/dL — AB (ref 8.6–10.4)
CHLORIDE: 105 mmol/L (ref 98–110)
CO2: 29 mmol/L (ref 20–31)
CREATININE: 0.8 mg/dL (ref 0.60–0.93)
GFR, Est African American: 85 mL/min (ref >=60)
GFR, Est Non African American: 73 mL/min (ref >=60)
Glucose, Bld: 87 mg/dL (ref 65–99)
Potassium: 3.6 mmol/L (ref 3.5–5.3)
Sodium: 141 mmol/L (ref 135–146)
TOTAL PROTEIN: 7 g/dL (ref 6.1–8.1)

## 2015-09-09 LAB — CBC WITH DIFFERENTIAL/PLATELET
BASOS ABS: 0 10*3/uL (ref 0.0–0.1)
Basophils Relative: 0 % (ref 0–1)
Eosinophils Absolute: 0.1 10*3/uL (ref 0.0–0.7)
Eosinophils Relative: 2 % (ref 0–5)
HEMATOCRIT: 40.6 % (ref 36.0–46.0)
HEMOGLOBIN: 13 g/dL (ref 12.0–15.0)
LYMPHS ABS: 1.7 10*3/uL (ref 0.7–4.0)
LYMPHS PCT: 31 % (ref 12–46)
MCH: 27.4 pg (ref 26.0–34.0)
MCHC: 32 g/dL (ref 30.0–36.0)
MCV: 85.7 fL (ref 78.0–100.0)
MPV: 11.5 fL (ref 8.6–12.4)
Monocytes Absolute: 0.5 10*3/uL (ref 0.1–1.0)
Monocytes Relative: 9 % (ref 3–12)
NEUTROS ABS: 3.1 10*3/uL (ref 1.7–7.7)
NEUTROS PCT: 58 % (ref 43–77)
Platelets: 180 10*3/uL (ref 150–400)
RBC: 4.74 MIL/uL (ref 3.87–5.11)
RDW: 17.6 % — AB (ref 11.5–15.5)
WBC: 5.4 10*3/uL (ref 4.0–10.5)

## 2015-09-09 LAB — HEMOGLOBIN A1C
Hgb A1c MFr Bld: 6.1 % — ABNORMAL HIGH (ref <5.7)
Mean Plasma Glucose: 128 mg/dL — ABNORMAL HIGH (ref <117)

## 2015-09-09 LAB — VITAMIN D 25 HYDROXY (VIT D DEFICIENCY, FRACTURES): VIT D 25 HYDROXY: 8 ng/mL — AB (ref 30–100)

## 2015-09-11 MED ORDER — VITAMIN D (ERGOCALCIFEROL) 1.25 MG (50000 UNIT) PO CAPS: 50000.0000 [IU] | ORAL_CAPSULE | ORAL | Status: DC

## 2015-09-11 NOTE — Addendum Note (Signed)
Addended by: Deirdre Pippins on: 09/11/2015 11:42 AM   Modules accepted: Orders

## 2015-12-08 ENCOUNTER — Ambulatory Visit (INDEPENDENT_AMBULATORY_CARE_PROVIDER_SITE_OTHER): Payer: Medicare Other | Admitting: Osteopathic Medicine

## 2015-12-08 ENCOUNTER — Encounter: Payer: Self-pay | Admitting: Osteopathic Medicine

## 2015-12-08 VITALS — BP 117/78 | HR 96 | Ht 67.0 in | Wt 170.0 lb

## 2015-12-08 DIAGNOSIS — I10 Essential (primary) hypertension: Secondary | ICD-10-CM

## 2015-12-08 DIAGNOSIS — J449 Chronic obstructive pulmonary disease, unspecified: Secondary | ICD-10-CM

## 2015-12-08 DIAGNOSIS — E559 Vitamin D deficiency, unspecified: Secondary | ICD-10-CM

## 2015-12-08 DIAGNOSIS — I509 Heart failure, unspecified: Secondary | ICD-10-CM | POA: Diagnosis not present

## 2015-12-08 DIAGNOSIS — Z23 Encounter for immunization: Secondary | ICD-10-CM | POA: Diagnosis not present

## 2015-12-08 DIAGNOSIS — R7303 Prediabetes: Secondary | ICD-10-CM

## 2015-12-08 LAB — BASIC METABOLIC PANEL WITH GFR
BUN: 11 mg/dL (ref 7–25)
CALCIUM: 8.7 mg/dL (ref 8.6–10.4)
CO2: 30 mmol/L (ref 20–31)
CREATININE: 1.01 mg/dL — AB (ref 0.60–0.93)
Chloride: 103 mmol/L (ref 98–110)
GFR, EST AFRICAN AMERICAN: 64 mL/min (ref >=60)
GFR, Est Non African American: 55 mL/min — ABNORMAL LOW (ref >=60)
Glucose, Bld: 93 mg/dL (ref 65–99)
Potassium: 3.7 mmol/L (ref 3.5–5.3)
Sodium: 142 mmol/L (ref 135–146)

## 2015-12-08 NOTE — Progress Notes (Signed)
HPI: Angelica Mcbride is a 75 y.o. female who [name] [place] for chief complaint of:  Chief Complaint  Patient presents with  . Follow-up    COPD, HTN    Cardiac: Patient has history of hypertension and CHF. Patient is on statin, aspirin, ARB, amlodipine, Lasix. No BB likely due to COPD concern.   Respiratory: Patient reports history of COPD, so that she was put on inhalers in the past however she is not interested in taking these, she doesn't think they were very helpful. When asked if she is able to accomplish everything she wants to in terms of mobility in taking care of herself without difficulty breathing, she states that she has no problems. She is still smoking.  Endocrine: declines repeat A1C   Msk: arthritis and difficulty walking very far, would like DMV forms filled out for handicap parking   Past medical, social and family history reviewed: Past Medical History  Diagnosis Date  . COPD (chronic obstructive pulmonary disease) (HCC)   . Shortness of breath   . CHF (congestive heart failure) (HCC) 07/2013    ACUTE  . HTN (hypertension), malignant     HX OF  . Respiratory failure (HCC)   . Chronic congestive heart failure (HCC) 08/14/2015  . Essential hypertension 08/14/2015  . Chronic obstructive pulmonary disease (HCC) 08/14/2015  . Prediabetes 08/14/2015   No past surgical history on file. Social History  Substance Use Topics  . Smoking status: Current Every Day Smoker -- 1.00 packs/day for 51 years    Types: Cigarettes    Start date: 07/15/1961  . Smokeless tobacco: Not on file  . Alcohol Use: No   Family History  Problem Relation Age of Onset  . Diabetes Daughter   . Hypertension Father   . Stroke Brother   . Heart attack Mother     Current Outpatient Prescriptions  Medication Sig Dispense Refill  . acetaminophen (TYLENOL) 500 MG tablet Take 500 mg by mouth every 8 (eight) hours as needed for mild pain.    Marland Kitchen.  albuterol (PROVENTIL HFA;VENTOLIN HFA) 108 (90 BASE) MCG/ACT inhaler Inhale 2 puffs into the lungs every 6 (six) hours as needed for wheezing or shortness of breath. 1 Inhaler 2  . amLODipine (NORVASC) 5 MG tablet Take 1 tablet (5 mg total) by mouth daily. 90 tablet 1  . aspirin 81 MG tablet Take 1 tablet (81 mg total) by mouth daily. 90 tablet 1  . furosemide (LASIX) 40 MG tablet Take 1 tablet (40 mg total) by mouth daily. 90 tablet 1  . losartan (COZAAR) 50 MG tablet Take 1 tablet (50 mg total) by mouth daily. 90 tablet 1  . pravastatin (PRAVACHOL) 40 MG tablet Take 1 tablet (40 mg total) by mouth daily. 90 tablet 1  . Vitamin D, Ergocalciferol, (DRISDOL) 50000 units CAPS capsule Take 1 capsule (50,000 Units total) by mouth every 7 (seven) days. Take for 8 total doses(weeks) 8 capsule 0   No current facility-administered medications for this visit.   No Known Allergies    Review of Systems: CONSTITUTIONAL:  No  Recent illness CARDIAC: No  chest pain, No  pressure, No palpitations, No  orthopnea RESPIRATORY: No  cough, No  shortness of breath/wheeze  Exam:  BP 117/78 mmHg  Pulse 96  Ht 5\' 7"  (1.702 m)  Wt 170 lb (77.111 kg)  BMI 26.62 kg/m2 Constitutional: VS see above. General Appearance: alert, well-developed, well-nourished, NAD Eyes: Normal lids and conjunctive, non-icteric  sclera Ears, Nose, Mouth, Throat: MMM, Normal external inspection ears/nares/mouth/lips/gums, Neck: No masses, trachea midline.  Respiratory: Normal respiratory effort. no wheeze, no rhonchi, no rales Cardiovascular: S1/S2 normal, no murmur, no rub/gallop auscultated. RRR. No JVD. Pedal pulse II/IV bilaterally DP and PT. No lower extremity edema. Musculoskeletal: Gait normal.  Neurological: No cranial nerve deficit on limited exam.  Skin: warm, dry, intact. Psychiatric: Normal judgment/insight. Normal mood and affect. Oriented x3.    No results found for this or any previous visit (from the past 72  hour(s)).    ASSESSMENT/PLAN: Blood pressure controlled on current medications, patient states no complaints, advised that if she develops difficulty breathing which impacts her activities of daily living or her quality of life is worth talking about getting back on inhalers for better control but at this time the patient is not interested in such. No PCV13 in record, will update. Forms for DMV filled out. Vit D recheck, will hold off on A1C per patient request. She'd like to come back in 4 months instead of 3, I'm amenable to this.   Chronic obstructive pulmonary disease, unspecified COPD type (HCC)  Chronic congestive heart failure, unspecified congestive heart failure type (HCC) - Plan: BASIC METABOLIC PANEL WITH GFR  Vitamin D deficiency - Plan: Vitamin D (25 hydroxy)  Essential hypertension - Plan: BASIC METABOLIC PANEL WITH GFR  Prediabetes  Need for vaccination with 13-polyvalent pneumococcal conjugate vaccine - Plan: Pneumococcal conjugate vaccine 13-valent IM   Return in about 4 months (around 04/09/2016), or sooner if needed, for MEDICARE WELLNESS EXAM.

## 2015-12-08 NOTE — Patient Instructions (Signed)
Plan to follow-up every 3-4 months for management/monitoring of chronic medical issues. Please don't hesitate to make an appointment sooner if you're having any acute concerns or problems!  Please let your pharmacy know when you are running low on medications/refills (do not wait until you are out of medicines). Your pharmacy will send our office a request for the appropriate medications. Please allow our office 2-3 business days to process the needed refills.   At any visits to any of your specialists, or if you get vaccines anywhere other than our office, please give them our clinic information so that they can forward us any records, including any tests which are done or changes to your medications. This allows all your physicians to communicate effectively, putting your primary care doctor at the center of your medical care and allowing us to effectively coordinate your care.   Let's plan to follow-up here in the office in 3 months for MEDICARE ANNUAL WELLNESS VISIT. If you would like to, you can get lab work done a few days before that visit so that we can go over the results in person at your appointment - if you'd like to do this, please call our office a week before your visit to ensure that the orders for the lab have been done.  Please let us know if there is anything else we can do for you. Take care! -Dr. Mervyn SkeetersA.

## 2015-12-09 LAB — VITAMIN D 25 HYDROXY (VIT D DEFICIENCY, FRACTURES): Vit D, 25-Hydroxy: 62 ng/mL (ref 30–100)

## 2016-02-01 ENCOUNTER — Other Ambulatory Visit: Payer: Self-pay | Admitting: Osteopathic Medicine

## 2016-04-09 ENCOUNTER — Encounter: Payer: Self-pay | Admitting: Osteopathic Medicine

## 2016-04-27 ENCOUNTER — Other Ambulatory Visit: Payer: Self-pay | Admitting: Osteopathic Medicine

## 2016-05-01 ENCOUNTER — Other Ambulatory Visit: Payer: Self-pay | Admitting: Osteopathic Medicine

## 2016-05-14 ENCOUNTER — Ambulatory Visit (INDEPENDENT_AMBULATORY_CARE_PROVIDER_SITE_OTHER): Payer: Medicare Other | Admitting: Osteopathic Medicine

## 2016-05-14 ENCOUNTER — Encounter: Payer: Self-pay | Admitting: Osteopathic Medicine

## 2016-05-14 VITALS — BP 130/81 | HR 98 | Ht 66.5 in | Wt 168.0 lb

## 2016-05-14 DIAGNOSIS — I509 Heart failure, unspecified: Secondary | ICD-10-CM

## 2016-05-14 DIAGNOSIS — Z0000 Encounter for general adult medical examination without abnormal findings: Secondary | ICD-10-CM | POA: Diagnosis not present

## 2016-05-14 DIAGNOSIS — Z23 Encounter for immunization: Secondary | ICD-10-CM | POA: Diagnosis not present

## 2016-05-14 DIAGNOSIS — J449 Chronic obstructive pulmonary disease, unspecified: Secondary | ICD-10-CM

## 2016-05-14 MED ORDER — ALBUTEROL SULFATE HFA 108 (90 BASE) MCG/ACT IN AERS: 2.0000 | INHALATION_SPRAY | RESPIRATORY_TRACT | 3 refills | Status: DC | PRN

## 2016-05-14 MED ORDER — PRAVASTATIN SODIUM 40 MG PO TABS: 40.0000 mg | ORAL_TABLET | Freq: Every day | ORAL | 1 refills | Status: DC

## 2016-05-14 MED ORDER — ASPIRIN 81 MG PO TABS: 81.0000 mg | ORAL_TABLET | Freq: Every day | ORAL | 1 refills | Status: DC

## 2016-05-14 MED ORDER — AMLODIPINE BESYLATE 5 MG PO TABS: 5.0000 mg | ORAL_TABLET | Freq: Every day | ORAL | 1 refills | Status: DC

## 2016-05-14 MED ORDER — FUROSEMIDE 40 MG PO TABS: 40.0000 mg | ORAL_TABLET | Freq: Every day | ORAL | 1 refills | Status: DC

## 2016-05-14 MED ORDER — LOSARTAN POTASSIUM 50 MG PO TABS: 50.0000 mg | ORAL_TABLET | Freq: Every day | ORAL | 1 refills | Status: DC

## 2016-05-14 NOTE — Patient Instructions (Addendum)
For fall prevention: Would recommend shower seat, showerhead with detachable hose, installing railings in bathtub area. Avoid baths where you have to lie down. Increase daily exercise, even if it's just walking around the block a few times per week.   For smoking/lungs: Let me know if coughing or shortness of breath gets worse, we have the option to do a daily inhaled medication to help with breathing. If you have a fever or coughing up significant amount of mucus, please seek medical care ASAP. I have refilled the albuterol for as needed use.  For heart: If you decide to decrease dose of Lasix to once per day due to frequent urination, please return to the office in one month for recheck of your blood pressure, but you need at least a low dose of this medicine to keep fluid off of your heart and avoid heart failure. The sure you are weighing herself regularly, if you gained 5 pounds or more over 2-3 days please let me know right away.   For other preventive care: You have been given more information on advanced directives, we have updated your flu shot and tetanus vaccine today. You have declined shingles shot, colon cancer screening, lung cancer screening, breast cancer screening. If any of these screening measures interest you in the future, please let me know and we can arrange these tests for you.  Plan to follow-up in the office in 6 months, call us one week before your appointment if you would like to get your blood work done ahead of time.

## 2016-05-14 NOTE — Progress Notes (Signed)
Subjective:    Angelica Mcbride is a 75 y.o. female who presents for Medicare Annual/Subsequent preventive examination.  Preventive Screening-Counseling & Management  Tobacco History  Smoking Status  . Current Every Day Smoker  . Packs/day: 1.00  . Years: 51.00  . Types: Cigarettes  . Start date: 07/15/1961  Smokeless Tobacco  . Never Used     Problems Prior to Visit 1. See problem list below   Needs refill on albuterol  Had some trouble getting out of the bath yesterday, but no falls.  Peeing a lot with the Lasix, is bothered by this.   Coughing a good bit, still smoking, occasionally productive, no fever.    Current Problems (verified) Patient Active Problem List   Diagnosis Date Noted  . Vitamin D deficiency 12/08/2015  . Chronic congestive heart failure (HCC) 08/14/2015  . Chronic obstructive pulmonary disease (HCC) 08/14/2015  . Essential hypertension 08/14/2015  . Prediabetes 08/14/2015  . Prediabetes 06/02/2015  . Healthcare maintenance 06/21/2014  . Headache(784.0) 12/01/2013  . COPD (chronic obstructive pulmonary disease) (HCC) 09/22/2013  . 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 Chronic diastolic congestive heart failure (HCC) 08/26/2013  . Tobacco abuse 08/02/2013  . 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 HTN (hypertension) 07/19/2013    Medications Prior to Visit Current Outpatient Prescriptions on File Prior to Visit  Medication Sig Dispense Refill  . acetaminophen (TYLENOL) 500 MG tablet Take 500 mg by mouth every 8 (eight) hours as needed for mild pain.    Marland Kitchen. albuterol (PROVENTIL HFA;VENTOLIN HFA) 108 (90 BASE) MCG/ACT inhaler Inhale 2 puffs into the lungs every 6 (six) hours as needed for wheezing or shortness of breath. 1 Inhaler 2  . amLODipine (NORVASC) 5 MG tablet Take 1 tablet (5 mg total) by mouth daily. NEED FOLLOW UP VISIT FOR MORE REFILLS 30 tablet 0  . aspirin 81 MG tablet Take 1 tablet (81 mg total) by mouth daily. 90 tablet 1  .  furosemide (LASIX) 40 MG tablet Take 1 tablet (40 mg total) by mouth daily. NEED FOLLOW UP VISIT FOR MORE REFILLS 30 tablet 0  . losartan (COZAAR) 50 MG tablet Take 1 tablet (50 mg total) by mouth daily. NEED FOLLOW UP VISIT FOR MORE REFILLS 30 tablet 0  . pravastatin (PRAVACHOL) 40 MG tablet Take 1 tablet (40 mg total) by mouth daily. APPOINTMENT NEEDED FOR FURTHER REFILLS 30 tablet 0  . Vitamin D, Ergocalciferol, (DRISDOL) 50000 units CAPS capsule Take 1 capsule (50,000 Units total) by mouth every 7 (seven) days. Take for 8 total doses(weeks) 8 capsule 0   No current facility-administered medications on file prior to visit.     Current Medications (verified) Current Outpatient Prescriptions  Medication Sig Dispense Refill  . acetaminophen (TYLENOL) 500 MG tablet Take 500 mg by mouth every 8 (eight) hours as needed for mild pain.    Marland Kitchen. albuterol (PROVENTIL HFA;VENTOLIN HFA) 108 (90 BASE) MCG/ACT inhaler Inhale 2 puffs into the lungs every 6 (six) hours as needed for wheezing or shortness of breath. 1 Inhaler 2  . amLODipine (NORVASC) 5 MG tablet Take 1 tablet (5 mg total) by mouth daily. NEED FOLLOW UP VISIT FOR MORE REFILLS 30 tablet 0  . aspirin 81 MG tablet Take 1 tablet (81 mg total) by mouth daily. 90 tablet 1  . furosemide (LASIX) 40 MG tablet Take 1 tablet (40 mg total) by mouth daily. NEED FOLLOW UP VISIT FOR MORE REFILLS 30 tablet 0  . losartan (COZAAR) 50 MG tablet Take 1 tablet (  50 mg total) by mouth daily. NEED FOLLOW UP VISIT FOR MORE REFILLS 30 tablet 0  . pravastatin (PRAVACHOL) 40 MG tablet Take 1 tablet (40 mg total) by mouth daily. APPOINTMENT NEEDED FOR FURTHER REFILLS 30 tablet 0  . Vitamin D, Ergocalciferol, (DRISDOL) 50000 units CAPS capsule Take 1 capsule (50,000 Units total) by mouth every 7 (seven) days. Take for 8 total doses(weeks) 8 capsule 0   No current facility-administered medications for this visit.      Allergies (verified) Review of patient's allergies  indicates no known allergies.   PAST HISTORY  Family History Family History  Problem Relation Age of Onset  . Diabetes Daughter   . Hypertension Father   . Stroke Brother   . Heart attack Mother     Social History Social History  Substance Use Topics  . Smoking status: Current Every Day Smoker    Packs/day: 1.00    Years: 51.00    Types: Cigarettes    Start date: 07/15/1961  . Smokeless tobacco: Never Used  . Alcohol use No     Are there smokers in your home (other than you)? No  Risk Factors Current exercise habits: The patient does not participate in regular exercise at present.  Dietary issues discussed: no concerns   Cardiac risk factors: advanced age (older than 1355 for men, 5265 for women), dyslipidemia, sedentary lifestyle and smoking/ tobacco exposure.  Depression Screen (Note: if answer to either of the following is "Yes", a more complete depression screening is indicated)   Over the past two weeks, have you felt down, depressed or hopeless? No  Over the past two weeks, have you felt little interest or pleasure in doing things? No  Have you lost interest or pleasure in daily life? No  Do you often feel hopeless? No  Do you cry easily over simple problems? No  Activities of Daily Living In your present state of health, do you have any difficulty performing the following activities?:  Driving? Yes Managing money?  No Feeding yourself? No Getting from bed to chair? No Climbing a flight of stairs? Yes Preparing food and eating?: No Bathing or showering? Yes Getting dressed: No Getting to the toilet? No Using the toilet:No Moving around from place to place: No In the past year have you fallen or had a near fall?:No   Are you sexually active?  No  Do you have more than one partner?  No  Hearing Difficulties: Yes Do you often ask people to speak up or repeat themselves? Yes Do you experience ringing or noises in your ears? No Do you have difficulty  understanding soft or whispered voices? Yes   Do you feel that you have a problem with memory? No  Do you often misplace items? No  Do you feel safe at home?  Yes  Cognitive Testing  Alert? Yes  Normal Appearance?Yes  Oriented to person? Yes  Place? Yes   Time? Yes  Recall of three objects?  Yes  Can perform simple calculations? Yes  Displays appropriate judgment?Yes  Can read the correct time from a watch face?Yes   Advanced Directives have been discussed with the patient? Yes  List the Names of Other Physician/Practitioners you currently use: 1.    Indicate any recent Medical Services you may have received from other than [facility] providers in the past year (date may be approximate).  Immunization History  Administered Date(s) Administered  . Influenza,inj,Quad PF,36+ Mos 07/25/2013, 06/21/2014, 06/02/2015  . Pneumococcal Conjugate-13 12/08/2015  .  Pneumococcal Polysaccharide-23 07/25/2013    Screening Tests Health Maintenance  Topic Date Due  . TETANUS/TDAP  05/09/1961  . MAMMOGRAM  05/09/1992  . COLONOSCOPY  05/09/1992  . ZOSTAVAX  05/09/2002  . DEXA SCAN  05/10/2007  . INFLUENZA VACCINE  02/13/2016  . PNA vac Low Risk Adult  Completed    All answers were reviewed with the patient and necessary referrals were made:  Sunnie Nielsen, DO   05/14/2016   History reviewed: allergies, current medications, past family history, past medical history, past social history, past surgical history and problem list  Review of Systems Pertinent items are noted in HPI.    Objective:     Vision by Snellen chart: right eye:20/0, left eye:20/40  Body mass index is 26.71 kg/m. BP 130/81   Pulse 98   Ht 5' 6.5" (1.689 m)   Wt 168 lb (76.2 kg)   BMI 26.71 kg/m   General appearance: alert, cooperative and appears stated age Head: Normocephalic, without obvious abnormality, atraumatic Eyes: conjunctivae/corneas clear. PERRL, EOM's intact. Fundi benign. Ears: normal TM's  and external ear canals both ears Neck: no adenopathy, no carotid bruit, no JVD, supple, symmetrical, trachea midline and thyroid not enlarged, symmetric, no tenderness/mass/nodules Lungs: diminished breath sounds bilaterally Heart: regular rate and rhythm, S1, S2 normal, no murmur, click, rub or gallop Abdomen: soft, non-tender; bowel sounds normal; no masses,  no organomegaly Extremities: extremities normal, atraumatic, no cyanosis or edema Skin: Skin color, texture, turgor normal. No rashes or lesions     Assessment:     Medicare annual wellness visit, subsequent - Plan: Tdap vaccine greater than or equal to 7yo IM, CANCELED: Flu Vaccine QUAD 36+ mos IM  Chronic obstructive pulmonary disease, unspecified COPD type (HCC) - Plan: albuterol (PROVENTIL HFA;VENTOLIN HFA) 108 (90 Base) MCG/ACT inhaler  Chronic congestive heart failure, unspecified congestive heart failure type (HCC) - Plan: aspirin 81 MG tablet, pravastatin (PRAVACHOL) 40 MG tablet, losartan (COZAAR) 50 MG tablet, furosemide (LASIX) 40 MG tablet, amLODipine (NORVASC) 5 MG tablet, CBC with Differential/Platelet, COMPLETE METABOLIC PANEL WITH GFR, Lipid panel       Plan:     During the course of the visit the patient was educated and counseled about appropriate screening and preventive services including:    Influenza vaccine  Td vaccine  Advanced directives: has NO advanced directive  - add't info requested. Referral to SW: not applicable  Diet review for nutrition referral? Yes __x__  Not Indicated _x___   Patient Instructions (the written plan) was given to the patient.  Patient Instructions  For fall prevention: Would recommend shower seat, showerhead with detachable hose, installing railings in bathtub area. Avoid baths where you have to lie down. Increase daily exercise, even if it's just walking around the block a few times per week.   For smoking/lungs: Let me know if coughing or shortness of breath gets  worse, we have the option to do a daily inhaled medication to help with breathing. If you have a fever or coughing up significant amount of mucus, please seek medical care ASAP. I have refilled the albuterol for as needed use.  For heart: If you decide to decrease dose of Lasix to once per day due to frequent urination, please return to the office in one month for recheck of your blood pressure, but you need at least a low dose of this medicine to keep fluid off of your heart and avoid heart failure. The sure you are weighing herself regularly, if you  gained 5 pounds or more over 2-3 days please let me know right away.   For other preventive care: You have been given more information on advanced directives, we have updated your flu shot and tetanus vaccine today. You have declined shingles shot, colon cancer screening, lung cancer screening, breast cancer screening. If any of these screening measures interest you in the future, please let me know and we can arrange these tests for you.  Plan to follow-up in the office in 6 months, call us one week before your appointment if you would like to get your blood work done ahead of time.     Medicare Attestation I have personally reviewed: The patient's medical and social history Their use of alcohol, tobacco or illicit drugs Their current medications and supplements The patient's functional ability including ADLs,fall risks, home safety risks, cognitive, and hearing and visual impairment Diet and physical activities Evidence for depression or mood disorders  The patient's weight, height, BMI, and visual acuity have been recorded in the chart.  I have made referrals, counseling, and provided education to the patient based on review of the above and I have provided the patient with a written personalized care plan for preventive services.     Sunnie Nielsen, DO   05/14/2016

## 2016-05-14 NOTE — Addendum Note (Signed)
Addended by: Pixie CasinoUNNINGHAM, Cory Rama C on: 05/14/2016 10:44 AM   Modules accepted: Orders

## 2016-07-23 ENCOUNTER — Other Ambulatory Visit: Payer: Self-pay

## 2016-07-23 DIAGNOSIS — I509 Heart failure, unspecified: Secondary | ICD-10-CM

## 2016-07-23 MED ORDER — ASPIRIN 81 MG PO TABS: 81.0000 mg | ORAL_TABLET | Freq: Every day | ORAL | 1 refills | Status: AC

## 2016-07-23 MED ORDER — AMLODIPINE BESYLATE 5 MG PO TABS: 5.0000 mg | ORAL_TABLET | Freq: Every day | ORAL | 1 refills | Status: DC

## 2016-07-23 MED ORDER — LOSARTAN POTASSIUM 50 MG PO TABS: 50.0000 mg | ORAL_TABLET | Freq: Every day | ORAL | 1 refills | Status: DC

## 2016-07-23 NOTE — Telephone Encounter (Signed)
Patient request a 90 day supply on Amlodipine 5mg , Aspirin 81 mg and Losartan 50 mg. It was approved and sent to [facility]. Rhonda Cunningham,CMA

## 2016-11-11 ENCOUNTER — Ambulatory Visit (INDEPENDENT_AMBULATORY_CARE_PROVIDER_SITE_OTHER): Payer: Medicare Other | Admitting: Osteopathic Medicine

## 2016-11-11 ENCOUNTER — Encounter: Payer: Self-pay | Admitting: Osteopathic Medicine

## 2016-11-11 VITALS — BP 146/81 | HR 93 | Ht 67.5 in | Wt 166.0 lb

## 2016-11-11 DIAGNOSIS — J449 Chronic obstructive pulmonary disease, unspecified: Secondary | ICD-10-CM | POA: Diagnosis not present

## 2016-11-11 DIAGNOSIS — I5032 Chronic diastolic (congestive) heart failure: Secondary | ICD-10-CM | POA: Diagnosis not present

## 2016-11-11 DIAGNOSIS — E559 Vitamin D deficiency, unspecified: Secondary | ICD-10-CM

## 2016-11-11 DIAGNOSIS — J302 Other seasonal allergic rhinitis: Secondary | ICD-10-CM

## 2016-11-11 DIAGNOSIS — R7303 Prediabetes: Secondary | ICD-10-CM | POA: Diagnosis not present

## 2016-11-11 DIAGNOSIS — I10 Essential (primary) hypertension: Secondary | ICD-10-CM

## 2016-11-11 LAB — CBC WITH DIFFERENTIAL/PLATELET
Basophils Absolute: 0 cells/uL (ref 0–200)
Basophils Relative: 0 %
EOS ABS: 120 {cells}/uL (ref 15–500)
Eosinophils Relative: 2 %
HCT: 44.9 % (ref 35.0–45.0)
Hemoglobin: 14.6 g/dL (ref 11.7–15.5)
Lymphocytes Relative: 33 %
Lymphs Abs: 1980 cells/uL (ref 850–3900)
MCH: 27.8 pg (ref 27.0–33.0)
MCHC: 32.5 g/dL (ref 32.0–36.0)
MCV: 85.5 fL (ref 80.0–100.0)
Monocytes Absolute: 600 cells/uL (ref 200–950)
Monocytes Relative: 10 %
NEUTROS ABS: 3300 {cells}/uL (ref 1500–7800)
Neutrophils Relative %: 55 %
PLATELETS: 168 10*3/uL (ref 140–400)
RBC: 5.25 MIL/uL — AB (ref 3.80–5.10)
RDW: 16.5 % — ABNORMAL HIGH (ref 11.0–15.0)
WBC: 6 10*3/uL (ref 3.8–10.8)

## 2016-11-11 LAB — LIPID PANEL
CHOL/HDL RATIO: 3 ratio (ref <5.0)
CHOLESTEROL: 122 mg/dL (ref <200)
HDL: 41 mg/dL — AB (ref >50)
LDL Cholesterol: 63 mg/dL (ref <100)
Triglycerides: 88 mg/dL (ref <150)
VLDL: 18 mg/dL (ref <30)

## 2016-11-11 LAB — COMPREHENSIVE METABOLIC PANEL
AG RATIO: 1.2 ratio (ref 1.0–2.5)
ALT: 13 U/L (ref 6–29)
AST: 18 U/L (ref 10–35)
Albumin: 4.1 g/dL (ref 3.6–5.1)
Alkaline Phosphatase: 73 U/L (ref 33–130)
BUN / CREAT RATIO: 11.5 ratio (ref 6–22)
BUN: 11 mg/dL (ref 7–25)
CALCIUM: 9.2 mg/dL (ref 8.6–10.4)
CHLORIDE: 102 mmol/L (ref 98–110)
CO2: 28 mmol/L (ref 20–31)
CREATININE: 0.96 mg/dL — AB (ref 0.60–0.93)
GFR, Est African American: 67 mL/min (ref >=60)
GFR, Est Non African American: 58 mL/min — ABNORMAL LOW (ref >=60)
GLUCOSE: 106 mg/dL — AB (ref 65–99)
Globulin: 3.3 g/dL (ref 1.9–3.7)
Potassium: 3.6 mmol/L (ref 3.5–5.3)
Sodium: 143 mmol/L (ref 135–146)
Total Bilirubin: 0.3 mg/dL (ref 0.2–1.2)
Total Protein: 7.4 g/dL (ref 6.1–8.1)

## 2016-11-11 LAB — TSH: TSH: 0.76 m[IU]/L

## 2016-11-11 MED ORDER — NAPROXEN 500 MG PO TABS: 500.0000 mg | ORAL_TABLET | Freq: Two times a day (BID) | ORAL | 1 refills | Status: AC

## 2016-11-11 MED ORDER — ACETAMINOPHEN 500 MG PO TABS: 1000.0000 mg | ORAL_TABLET | Freq: Four times a day (QID) | ORAL | Status: AC | PRN

## 2016-11-11 NOTE — Patient Instructions (Signed)
Plan: 1. 6 months follow-up with Dr Lyn Hollingshead for wellness exam 2. Nurse visit in the next few weeks to recheck blood pressure

## 2016-11-11 NOTE — Progress Notes (Signed)
HPI: Angelica Mcbride is a 76 y.o. female  who presents to [facility] today, 11/11/16,  for chief complaint of:  Chief Complaint  Patient presents with  . Follow-up    HTN, COPD, Pre-DM, arthritis      Pain/Arthritis - Was taking Aleve for pulled muscle and has some questions about pain relief at home. Used up a whole bottle ina short amount of time. Back feels better at this point.   Essential hypertension - BP slightly elevated today, pt states she took her meds today as usual. Was 130/81 at last visit 6 mos ago. Pt reports stress today, has been awake for awhile. No CP/SOB, no exercise intolerance.   Chronic diastolic congestive heart failure (HCC) - last eval by cardio 01/05/2014 for dyspnea presumed diastolic dysfunction - mostly symptoms likely COPD, pt has never followed up with cardio (f/u was recommended in one year w/ Dr. Melburn Popper). Med list as below. Pt is on ASA, Statin, ARB, CCB and diuretics.   Chronic obstructive pulmonary disease - Albuterol only. Uses as needed. Still smoking, aware of risk   Prediabetes - A1C due, last done >1 year ago was 6.1%.   Vitamin D deficiency - previously on high dose D supplementation, due for recheck, hasn't been on this medication in some time   Seasonal allergies - taking OTC medications, helping somewhat but still stuffy nose.      Past medical, surgical, social and family history reviewed: Patient Active Problem List   Diagnosis Date Noted  . Vitamin D deficiency 12/08/2015  . Chronic congestive heart failure (HCC) 08/14/2015  . Chronic obstructive pulmonary disease (HCC) 08/14/2015  . Essential hypertension 08/14/2015  . Prediabetes 08/14/2015  . Prediabetes 06/02/2015  . Healthcare maintenance 06/21/2014  . Headache(784.0) 12/01/2013  . COPD (chronic obstructive pulmonary disease) (HCC) 09/22/2013  . 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 Chronic diastolic congestive heart failure (HCC)  08/26/2013  . Tobacco abuse 08/02/2013  . 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 HTN (hypertension) 07/19/2013   No past surgical history on file. Social History  Substance Use Topics  . Smoking status: Current Every Day Smoker    Packs/day: 1.00    Years: 51.00    Types: Cigarettes    Start date: 07/15/1961  . Smokeless tobacco: Never Used  . Alcohol use No   Family History  Problem Relation Age of Onset  . Diabetes Daughter   . Hypertension Father   . Stroke Brother   . Heart attack Mother      Current medication list and allergy/intolerance information reviewed:   Current Outpatient Prescriptions  Medication Sig Dispense Refill  . acetaminophen (TYLENOL) 500 MG tablet Take 500 mg by mouth every 8 (eight) hours as needed for mild pain.    Marland Kitchen albuterol (PROVENTIL HFA;VENTOLIN HFA) 108 (90 Base) MCG/ACT inhaler Inhale 2 puffs into the lungs every 4 (four) hours as needed for wheezing or shortness of breath. 1 Inhaler 3  . amLODipine (NORVASC) 5 MG tablet Take 1 tablet (5 mg total) by mouth daily. 90 tablet 1  . aspirin 81 MG tablet Take 1 tablet (81 mg total) by mouth daily. 90 tablet 1  . furosemide (LASIX) 40 MG tablet Take 1 tablet (40 mg total) by mouth daily. 90 tablet 1  . losartan (COZAAR) 50 MG tablet Take 1 tablet (50 mg total) by mouth daily. 90 tablet 1  . pravastatin (PRAVACHOL) 40 MG tablet Take 1 tablet (40 mg total) by mouth daily. 90  tablet 1  . Vitamin D, Ergocalciferol, (DRISDOL) 50000 units CAPS capsule Take 1 capsule (50,000 Units total) by mouth every 7 (seven) days. Take for 8 total doses(weeks) 8 capsule 0   No current facility-administered medications for this visit.    No Known Allergies    Review of Systems:  Constitutional:  No  fever, no chills, No recent illness  HEENT: No  headache  Cardiac: No  chest pain, No  pressure, No palpitations, No  Orthopnea  Respiratory:  No  shortness of breath. +chronic dry Cough  Gastrointestinal: No   abdominal pain  Musculoskeletal: No new myalgia/arthralgia  Neurologic: No  weakness, No  dizziness  Exam:  BP (!) 146/81   Pulse 93   Ht 5' 7.5" (1.715 m)   Wt 166 lb (75.3 kg)   BMI 25.62 kg/m   Constitutional: VS see above. General Appearance: alert, well-developed, well-nourished, NAD  Eyes: Normal lids and conjunctive, non-icteric sclera  Ears, Nose, Mouth, Throat: MMM, Normal external inspection ears/nares/mouth/lips/gums. Normal nasal mucosa  Neck: No masses, trachea midline. No thyroid enlargement. No tenderness/mass appreciated. No lymphadenopathy  Respiratory: Normal respiratory effort. no wheeze, no rhonchi, no rales. Diminished breath sounds bilaterally  Cardiovascular: S1/S2 normal, no murmur, no rub/gallop auscultated. RRR. No lower extremity edema.  Musculoskeletal: Gait normal.   Neurological: Normal balance/coordination. No tremor.   Skin: warm, dry, intact. No rash/ulcer.   Psychiatric: Normal judgment/insight. Normal mood and affect. Oriented x3.     ASSESSMENT/PLAN:   Essential hypertension - Plan for nurse visit to recheck blood pressure - Plan: CBC with Differential/Platelet, COMPLETE METABOLIC PANEL WITH GFR, Lipid panel, TSH  Chronic diastolic congestive heart failure (HCC) - No clinical HF at this time - Plan: CBC with Differential/Platelet, COMPLETE METABOLIC PANEL WITH GFR, Lipid panel, TSH  Chronic obstructive pulmonary disease, unspecified COPD type (HCC) - Advised to quit smoking, patient would not like any other inhalers at this time besides albuterol - Plan: CBC with Differential/Platelet, COMPLETE METABOLIC PANEL WITH GFR  Prediabetes - Plan: Hemoglobin A1c  Vitamin D deficiency - Plan: VITAMIN D 25 Hydroxy (Vit-D Deficiency, Fractures)  Seasonal allergic rhinitis, unspecified trigger - Trial of nasal steroid spray in addition to second-generation antihistamine OTC    Patient Instructions  Plan: 1. 6 months follow-up with Dr  Lyn Hollingshead for wellness exam 2. Nurse visit in the next few weeks to recheck blood pressure     Visit summary with medication list and pertinent instructions was printed for patient to review. All questions at time of visit were answered - patient instructed to contact office with any additional concerns. ER/RTC precautions were reviewed with the patient. Follow-up plan: Return in about 6 months (around 05/13/2017) for [facility] VISIT, next few weeks for nurse visit to recheck blood pressure.

## 2016-11-12 LAB — HEMOGLOBIN A1C
Hgb A1c MFr Bld: 5.6 % (ref <5.7)
Mean Plasma Glucose: 114 mg/dL

## 2016-11-12 LAB — VITAMIN D 25 HYDROXY (VIT D DEFICIENCY, FRACTURES): Vit D, 25-Hydroxy: 85 ng/mL (ref 30–100)

## 2016-11-29 ENCOUNTER — Ambulatory Visit (INDEPENDENT_AMBULATORY_CARE_PROVIDER_SITE_OTHER): Payer: Medicare Other | Admitting: Physician Assistant

## 2016-11-29 VITALS — BP 115/70 | HR 90 | Ht 67.0 in | Wt 164.0 lb

## 2016-11-29 DIAGNOSIS — I10 Essential (primary) hypertension: Secondary | ICD-10-CM | POA: Diagnosis not present

## 2016-11-29 NOTE — Patient Instructions (Signed)
Patient was in the office for blood pressure check patient denied anything abnormal during visit. Blood pressure was 115/70 p90. Patient advised to keep scheduled follow up appointment with Dr. Lyn HollingsheadAlexander. Rhonda Cunningham,CMA

## 2016-11-29 NOTE — Progress Notes (Signed)
Pt BP looks great. Continue on norvasc. Follow up with PCP as previously discussed. Tandy GawJade Joaovictor Krone PA-C

## 2016-12-25 ENCOUNTER — Other Ambulatory Visit: Payer: Self-pay | Admitting: Osteopathic Medicine

## 2016-12-25 DIAGNOSIS — I509 Heart failure, unspecified: Secondary | ICD-10-CM

## 2017-01-03 ENCOUNTER — Other Ambulatory Visit: Payer: Self-pay

## 2017-01-03 MED ORDER — AMLODIPINE BESYLATE 5 MG PO TABS: 5.0000 mg | ORAL_TABLET | Freq: Every day | ORAL | 1 refills | Status: DC

## 2017-05-12 ENCOUNTER — Encounter: Payer: Self-pay | Admitting: Osteopathic Medicine

## 2017-05-12 ENCOUNTER — Ambulatory Visit (INDEPENDENT_AMBULATORY_CARE_PROVIDER_SITE_OTHER): Payer: Medicare Other | Admitting: Osteopathic Medicine

## 2017-05-12 VITALS — BP 122/78 | HR 95 | Wt 153.0 lb

## 2017-05-12 DIAGNOSIS — R7302 Impaired glucose tolerance (oral): Secondary | ICD-10-CM | POA: Diagnosis not present

## 2017-05-12 DIAGNOSIS — J449 Chronic obstructive pulmonary disease, unspecified: Secondary | ICD-10-CM | POA: Diagnosis not present

## 2017-05-12 DIAGNOSIS — Z0000 Encounter for general adult medical examination without abnormal findings: Secondary | ICD-10-CM

## 2017-05-12 DIAGNOSIS — M67431 Ganglion, right wrist: Secondary | ICD-10-CM

## 2017-05-12 DIAGNOSIS — Z23 Encounter for immunization: Secondary | ICD-10-CM

## 2017-05-12 DIAGNOSIS — I10 Essential (primary) hypertension: Secondary | ICD-10-CM | POA: Diagnosis not present

## 2017-05-12 DIAGNOSIS — Z780 Asymptomatic menopausal state: Secondary | ICD-10-CM | POA: Diagnosis not present

## 2017-05-12 NOTE — Progress Notes (Signed)
Subjective:    Angelica Mcbride is a 76 y.o. female who presents for Medicare Annual/Subsequent preventive examination.  Preventive Screening-Counseling & Management  Tobacco History  Smoking Status  . Current Every Day Smoker  . Packs/day: 1.00  . Years: 51.00  . Types: Cigarettes  . Start date: 07/15/1961  Smokeless Tobacco  . Never Used     Problems Prior to Visit 1. See problem list below  Ganglion cyst R wrist, not bothersome     Current Problems (verified) Patient Active Problem List   Diagnosis Date Noted  . Vitamin D deficiency 12/08/2015  . Chronic congestive heart failure (HCC) 08/14/2015  . Chronic obstructive pulmonary disease (HCC) 08/14/2015  . Essential hypertension 08/14/2015  . Prediabetes 08/14/2015  . Prediabetes 06/02/2015  . Healthcare maintenance 06/21/2014  . Headache(784.0) 12/01/2013  . COPD (chronic obstructive pulmonary disease) (HCC) 09/22/2013  . 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 Chronic diastolic congestive heart failure (HCC) 08/26/2013  . Tobacco abuse 08/02/2013  . 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 HTN (hypertension) 07/19/2013    Medications Prior to Visit Current Outpatient Prescriptions on File Prior to Visit  Medication Sig Dispense Refill  . acetaminophen (TYLENOL) 500 MG tablet Take 2 tablets (1,000 mg total) by mouth every 6 (six) hours as needed for mild pain or moderate pain. 30 tablet   . albuterol (PROVENTIL HFA;VENTOLIN HFA) 108 (90 Base) MCG/ACT inhaler Inhale 2 puffs into the lungs every 4 (four) hours as needed for wheezing or shortness of breath. 1 Inhaler 3  . amLODipine (NORVASC) 5 MG tablet Take 1 tablet (5 mg total) by mouth daily. 90 tablet 1  . aspirin 81 MG tablet Take 1 tablet (81 mg total) by mouth daily. 90 tablet 1  . furosemide (LASIX) 40 MG tablet Take 1 tablet (40 mg total) by mouth daily. 90 tablet 1  . losartan (COZAAR) 50 MG tablet TAKE 1 TABLET DAILY 90 tablet 1  . naproxen (NAPROSYN) 500 MG  tablet Take 1 tablet (500 mg total) by mouth 2 (two) times daily with a meal. 180 tablet 1  . pravastatin (PRAVACHOL) 40 MG tablet Take 1 tablet (40 mg total) by mouth daily. 90 tablet 1   No current facility-administered medications on file prior to visit.     Current Medications (verified) Current Outpatient Prescriptions  Medication Sig Dispense Refill  . acetaminophen (TYLENOL) 500 MG tablet Take 2 tablets (1,000 mg total) by mouth every 6 (six) hours as needed for mild pain or moderate pain. 30 tablet   . albuterol (PROVENTIL HFA;VENTOLIN HFA) 108 (90 Base) MCG/ACT inhaler Inhale 2 puffs into the lungs every 4 (four) hours as needed for wheezing or shortness of breath. 1 Inhaler 3  . amLODipine (NORVASC) 5 MG tablet Take 1 tablet (5 mg total) by mouth daily. 90 tablet 1  . aspirin 81 MG tablet Take 1 tablet (81 mg total) by mouth daily. 90 tablet 1  . furosemide (LASIX) 40 MG tablet Take 1 tablet (40 mg total) by mouth daily. 90 tablet 1  . losartan (COZAAR) 50 MG tablet TAKE 1 TABLET DAILY 90 tablet 1  . naproxen (NAPROSYN) 500 MG tablet Take 1 tablet (500 mg total) by mouth 2 (two) times daily with a meal. 180 tablet 1  . pravastatin (PRAVACHOL) 40 MG tablet Take 1 tablet (40 mg total) by mouth daily. 90 tablet 1   No current facility-administered medications for this visit.      Allergies (verified) Patient has no known allergies.  PAST HISTORY  Family History Family History  Problem Relation Age of Onset  . Diabetes Daughter   . Hypertension Father   . Stroke Brother   . Heart attack Mother     Social History Social History  Substance Use Topics  . Smoking status: Current Every Day Smoker    Packs/day: 1.00    Years: 51.00    Types: Cigarettes    Start date: 07/15/1961  . Smokeless tobacco: Never Used  . Alcohol use No     Are there smokers in your home (other than you)? No  Risk Factors Current exercise habits: The patient does not participate in regular  exercise at present.  Dietary issues discussed: no concerns   Cardiac risk factors: advanced age (older than 30 for men, 69 for women), dyslipidemia, sedentary lifestyle and smoking/ tobacco exposure.  Depression Screen (Note: if answer to either of the following is "Yes", a more complete depression screening is indicated)   Over the past two weeks, have you felt down, depressed or hopeless? No  Over the past two weeks, have you felt little interest or pleasure in doing things? No  Have you lost interest or pleasure in daily life? No  Do you often feel hopeless? No  Do you cry easily over simple problems? No  Activities of Daily Living In your present state of health, do you have any difficulty performing the following activities?:  Driving? Yes Managing money?  No Feeding yourself? No Getting from bed to chair? No Climbing a flight of stairs? Yes Preparing food and eating?: No Bathing or showering? No  Getting dressed: No Getting to the toilet? No Using the toilet:No Moving around from place to place: No In the past year have you fallen or had a near fall?:No   Are you sexually active?  No  Do you have more than one partner?  No  Hearing Difficulties: Yes Do you often ask people to speak up or repeat themselves? No Do you experience ringing or noises in your ears? No Do you have difficulty understanding soft or whispered voices? No    Do you feel that you have a problem with memory? No  Do you often misplace items? No  Do you feel safe at home?  Yes  Cognitive Testing  Alert? Yes  Normal Appearance?Yes  Oriented to person? Yes  Place? Yes   Time? Yes  Recall of three objects?  Yes  Can perform simple calculations? Yes  Displays appropriate judgment?Yes  Can read the correct time from a watch face?Yes   Advanced Directives have been discussed with the patient? Yes  List the Names of Other Physician/Practitioners you currently use: 1.  none  Indicate any recent  Medical Services you may have received from other than [facility] providers in the past year (date may be approximate).    Screening Tests Health Maintenance  Topic Date Due  . DEXA SCAN  05/14/2017 (Originally 05/10/2007)  . COLONOSCOPY  05/14/2017 (Originally 05/09/1992)  . TETANUS/TDAP  05/14/2026  . INFLUENZA VACCINE  Completed  . PNA vac Low Risk Adult  Completed   FEMALE PREVENTIVE CARE Updated 05/12/17   ANNUAL SCREENING/COUNSELING  Diet/Exercise - HEALTHY HABITS DISCUSSED TO DECREASE CV RISK History  Smoking Status  . Current Every Day Smoker  . Packs/day: 1.00  . Years: 51.00  . Types: Cigarettes  . Start date: 07/15/1961  Smokeless Tobacco  . Never Used   History  Alcohol Use No   Depression screen [facility] 2/9 05/12/2017  Decreased Interest 0  Down, Depressed, Hopeless 0  PHQ - 2 Score 0  Altered sleeping 0  Tired, decreased energy 0  Change in appetite 0  Feeling bad or failure about yourself  0  Trouble concentrating 0  Moving slowly or fidgety/restless 0  Suicidal thoughts 0  PHQ-9 Score 0   Domestic violence concerns - no HTN SCREENING - SEE VITALS  SEXUAL HEALTH  Sexually active in the past year - No  Need/want STI testing today? - no  INFECTIOUS DISEASE SCREENING  HIV - does not need  GC/CT - does not need  HepC - DOB [birth year] - does not need  TB - does not need  DISEASE SCREENING  Lipid - does not need  Osteoporosis - women age 19+ - will think about it   CANCER SCREENING  Cervical - does not need  Breast - declined  Lung - does not need  Colon - declined  ADULT VACCINATION  Influenza - annual vaccine recommended  Td - booster every 10 years   Zoster - Shingrix recommended 50+ - declined  PCV13 - already has  PPSV23 - already has Immunization History  Administered Date(s) Administered  . Influenza, High Dose Seasonal PF 05/14/2016, 05/12/2017  . Influenza,inj,Quad PF,6+ Mos 07/25/2013, 06/21/2014, 06/02/2015  .  Pneumococcal Conjugate-13 12/08/2015  . Pneumococcal Polysaccharide-23 07/25/2013  . Tdap 05/14/2016    OTHER  Fall - exercise and Vit D age 19+ - needs      All answers were reviewed with the patient and necessary referrals were made:  Sunnie Nielsen, DO   05/12/2017   History reviewed: allergies, current medications, past family history, past medical history, past social history, past surgical history and problem list  Review of Systems Pertinent items are noted in HPI.    Objective:      Body mass index is 23.96 kg/m. BP 122/78   Pulse 95   Wt 153 lb (69.4 kg)   BMI 23.96 kg/m   General appearance: alert, cooperative and appears stated age Head: Normocephalic, without obvious abnormality, atraumatic Eyes: conjunctivae/corneas clear. PERRL, EOM's intact. Fundi benign. Ears: normal TM's and external ear canals both ears Neck: no adenopathy, no carotid bruit, no JVD, supple, symmetrical, trachea midline and thyroid not enlarged, symmetric, no tenderness/mass/nodules Lungs: diminished breath sounds bilaterally Heart: regular rate and rhythm, S1, S2 normal, no murmur, click, rub or gallop Abdomen: soft, non-tender; bowel sounds normal; no masses,  no organomegaly Extremities: extremities normal, atraumatic, no cyanosis or edema Skin: Skin color, texture, turgor normal. No rashes or lesions     Assessment:     Medicare annual wellness visit, subsequent  Ganglion cyst of wrist, right - RTC for sports med if bothersome   Need for immunization against influenza - Plan: Flu vaccine HIGH DOSE PF  Glucose intolerance (impaired glucose tolerance)  Postmenopausal  Chronic obstructive pulmonary disease, unspecified COPD type (HCC)  Essential hypertension       Plan:     During the course of the visit the patient was educated and counseled about appropriate screening and preventive services including:    Influenza vaccine  Td vaccine  Screening  mammography  Bone densitometry screening  Colorectal cancer screening  Glaucoma screening  Nutrition counseling   Advanced directives: has NO advanced directive  - add't info requested. Referral to SW: n/a, AD papers provided  Diet review for nutrition referral? Yes __x__  Not Indicated _x___   Patient Instructions (the written plan) was given to the patient.  There are no Patient Instructions on file for this visit.   Medicare Attestation I have personally reviewed: The patient's medical and social history Their use of alcohol, tobacco or illicit drugs Their current medications and supplements The patient's functional ability including ADLs,fall risks, home safety risks, cognitive, and hearing and visual impairment Diet and physical activities Evidence for depression or mood disorders  The patient's weight, height, BMI, and visual acuity have been recorded in the chart.  I have made referrals, counseling, and provided education to the patient based on review of the above and I have provided the patient with a written personalized care plan for preventive services.     Sunnie NielsenNatalie Carter Kassel, DO   05/12/2017

## 2017-06-17 ENCOUNTER — Other Ambulatory Visit: Payer: Self-pay | Admitting: Osteopathic Medicine

## 2017-06-17 DIAGNOSIS — I509 Heart failure, unspecified: Secondary | ICD-10-CM

## 2017-06-25 ENCOUNTER — Other Ambulatory Visit: Payer: Self-pay

## 2017-06-25 DIAGNOSIS — I509 Heart failure, unspecified: Secondary | ICD-10-CM

## 2017-06-25 MED ORDER — PRAVASTATIN SODIUM 40 MG PO TABS: 40.0000 mg | ORAL_TABLET | Freq: Every day | ORAL | 1 refills | Status: DC

## 2017-11-10 ENCOUNTER — Ambulatory Visit (INDEPENDENT_AMBULATORY_CARE_PROVIDER_SITE_OTHER): Payer: Medicare Other | Admitting: Osteopathic Medicine

## 2017-11-10 ENCOUNTER — Ambulatory Visit: Payer: Self-pay | Admitting: Osteopathic Medicine

## 2017-11-10 ENCOUNTER — Encounter: Payer: Self-pay | Admitting: Osteopathic Medicine

## 2017-11-10 VITALS — BP 137/75 | HR 87 | Temp 98.2°F | Wt 156.6 lb

## 2017-11-10 DIAGNOSIS — R7303 Prediabetes: Secondary | ICD-10-CM

## 2017-11-10 DIAGNOSIS — Z1211 Encounter for screening for malignant neoplasm of colon: Secondary | ICD-10-CM | POA: Diagnosis not present

## 2017-11-10 DIAGNOSIS — J449 Chronic obstructive pulmonary disease, unspecified: Secondary | ICD-10-CM

## 2017-11-10 DIAGNOSIS — I509 Heart failure, unspecified: Secondary | ICD-10-CM

## 2017-11-10 DIAGNOSIS — I10 Essential (primary) hypertension: Secondary | ICD-10-CM | POA: Diagnosis not present

## 2017-11-10 DIAGNOSIS — Z13820 Encounter for screening for osteoporosis: Secondary | ICD-10-CM

## 2017-11-10 LAB — CBC
HCT: 41.8 % (ref 35.0–45.0)
HEMOGLOBIN: 14.4 g/dL (ref 11.7–15.5)
MCH: 30.4 pg (ref 27.0–33.0)
MCHC: 34.4 g/dL (ref 32.0–36.0)
MCV: 88.2 fL (ref 80.0–100.0)
MPV: 11.9 fL (ref 7.5–12.5)
Platelets: 159 10*3/uL (ref 140–400)
RBC: 4.74 10*6/uL (ref 3.80–5.10)
RDW: 15.5 % — ABNORMAL HIGH (ref 11.0–15.0)
WBC: 5.6 10*3/uL (ref 3.8–10.8)

## 2017-11-10 NOTE — Progress Notes (Signed)
HPI: Angelica Mcbride is a 77 y.o. female who  has a past medical history of CHF (congestive heart failure) (HCC) (07/2013), Chronic congestive heart failure (HCC) (08/14/2015), Chronic obstructive pulmonary disease (HCC) (08/14/2015), COPD (chronic obstructive pulmonary disease) (HCC), Essential hypertension (08/14/2015), HTN (hypertension), malignant, Prediabetes (08/14/2015), Respiratory failure (HCC), and Shortness of breath.  she presents to [facility] today, 11/10/17,  for chief complaint of:  BP follow-up Fasting - requests labs   . Doing well on all current medications. No CP/SOB.    CHF:  No orthopnea or SOB, no chest pain or LE edema. Stable   . COPD: stable, lung sounds aren't great but she has no SOB symptoms or limitations to ADLs   Preventive: would like to get colonoscopy and DEXA as discussed at her most recent annual    Patient is accompanied by daughter who assists with history-taking.   Past medical history, surgical history, and family history reviewed.  Current medication list and allergy/intolerance information reviewed.   (See remainder of HPI, as well as ROS, PE below)    ASSESSMENT/PLAN:   Essential hypertension - Plan: CBC, COMPLETE METABOLIC PANEL WITH GFR, Lipid panel  Prediabetes - Plan: Hemoglobin A1c  Chronic obstructive pulmonary disease, unspecified COPD type (HCC)  Chronic congestive heart failure, unspecified heart failure type (HCC)  Screening for osteoporosis - Plan: DG Bone Density  Screen for colon cancer - Plan: Ambulatory referral to Gastroenterology   Orders Placed This Encounter  Procedures  . DG Bone Density  . CBC  . COMPLETE METABOLIC PANEL WITH GFR  . Lipid panel  . Hemoglobin A1c  . Ambulatory referral to Gastroenterology    Follow-up plan: Return in about 6 months (around 05/12/2018) for [facility], sooner if needed  .      #################################### #################################### ####################################   Outpatient Encounter Medications as of 11/10/2017  Medication Sig  . acetaminophen (TYLENOL) 500 MG tablet Take 2 tablets (1,000 mg total) by mouth every 6 (six) hours as needed for mild pain or moderate pain.  Marland Kitchen albuterol (PROVENTIL HFA;VENTOLIN HFA) 108 (90 Base) MCG/ACT inhaler Inhale 2 puffs into the lungs every 4 (four) hours as needed for wheezing or shortness of breath.  Marland Kitchen amLODipine (NORVASC) 5 MG tablet TAKE 1 TABLET DAILY  . aspirin 81 MG tablet Take 1 tablet (81 mg total) by mouth daily.  . furosemide (LASIX) 40 MG tablet TAKE 1 TABLET DAILY  . losartan (COZAAR) 50 MG tablet TAKE 1 TABLET DAILY  . losartan (COZAAR) 50 MG tablet TAKE 1 TABLET DAILY  . naproxen (NAPROSYN) 500 MG tablet Take 1 tablet (500 mg total) by mouth 2 (two) times daily with a meal.  . pravastatin (PRAVACHOL) 40 MG tablet Take 1 tablet (40 mg total) by mouth daily.   No facility-administered encounter medications on file as of 11/10/2017.    No Known Allergies    Review of Systems:  Constitutional: No recent illness  HEENT: No  headache, no vision change  Cardiac: No  chest pain, No  pressure, No palpitations  Respiratory:  No  shortness of breath. No  Cough  Gastrointestinal: No  abdominal pain  Musculoskeletal: No new myalgia/arthralgia  Neurologic: No  weakness, No  Dizziness  Psychiatric: No  concerns with depression, No  concerns with anxiety  Exam:  BP 137/75 (BP Location: Left Arm, Patient Position: Sitting, Cuff Size: Normal)   Pulse 87   Temp 98.2 F (36.8 C) (Oral)   Wt 156 lb 9.6  oz (71 kg)   BMI 24.53 kg/m   Constitutional: VS see above. General Appearance: alert, well-developed, well-nourished, NAD  Eyes: Normal lids and conjunctive, non-icteric sclera  Ears, Nose, Mouth, Throat: MMM, Normal external inspection ears/nares/mouth/lips/gums.  Neck:  No masses, trachea midline.   Respiratory: Normal respiratory effort. Coarse breath sounds diffusely bilaterally   Cardiovascular: S1/S2 normal, no murmur, no rub/gallop auscultated. RRR.   Musculoskeletal: Gait normal. Symmetric and independent movement of all extremities  Neurological: Normal balance/coordination. No tremor.  Skin: warm, dry, intact.   Psychiatric: Normal judgment/insight. Normal mood and affect. Oriented x3.   Visit summary with medication list and pertinent instructions was printed for patient to review, alert [place] if any changes needed. All questions at time of visit were answered - patient instructed to contact office with any additional concerns. ER/RTC precautions were reviewed with the patient and understanding verbalized.   Follow-up plan: Return in about 6 months (around 05/12/2018) for [facility], sooner if needed .    Please note: voice recognition software was used to produce this document, and typos may escape review. Please contact Dr. Lyn Hollingshead for any needed clarifications.

## 2017-11-11 LAB — LIPID PANEL
Cholesterol: 141 mg/dL (ref <200)
HDL: 42 mg/dL — ABNORMAL LOW (ref >50)
LDL Cholesterol (Calc): 84 mg/dL (calc)
NON-HDL CHOLESTEROL (CALC): 99 mg/dL (ref <130)
Total CHOL/HDL Ratio: 3.4 (calc) (ref <5.0)
Triglycerides: 72 mg/dL (ref <150)

## 2017-11-11 LAB — COMPLETE METABOLIC PANEL WITH GFR
AG RATIO: 1.3 (calc) (ref 1.0–2.5)
ALT: 8 U/L (ref 6–29)
Albumin: 4.3 g/dL (ref 3.6–5.1)
Alkaline phosphatase (APISO): 63 U/L (ref 33–130)
BILIRUBIN TOTAL: 0.4 mg/dL (ref 0.2–1.2)
BUN: 9 mg/dL (ref 7–25)
CALCIUM: 8.9 mg/dL (ref 8.6–10.4)
CHLORIDE: 102 mmol/L (ref 98–110)
CO2: 28 mmol/L (ref 20–32)
Creat: 0.91 mg/dL (ref 0.60–0.93)
GFR, EST NON AFRICAN AMERICAN: 62 mL/min/{1.73_m2} (ref >=60)
GFR, Est African American: 72 mL/min/{1.73_m2} (ref >=60)
GLOBULIN: 3.2 g/dL (ref 1.9–3.7)
Glucose, Bld: 98 mg/dL (ref 65–99)
SODIUM: 139 mmol/L (ref 135–146)
Total Protein: 7.5 g/dL (ref 6.1–8.1)

## 2017-11-11 LAB — HEMOGLOBIN A1C
EAG (MMOL/L): 6.6 (calc)
HEMOGLOBIN A1C: 5.8 %{Hb} — AB (ref <5.7)
Mean Plasma Glucose: 120 (calc)

## 2017-11-19 ENCOUNTER — Other Ambulatory Visit: Payer: Self-pay | Admitting: Osteopathic Medicine

## 2017-11-19 ENCOUNTER — Ambulatory Visit (INDEPENDENT_AMBULATORY_CARE_PROVIDER_SITE_OTHER): Payer: Medicare Other

## 2017-11-19 ENCOUNTER — Encounter: Payer: Self-pay | Admitting: Physician Assistant

## 2017-11-19 DIAGNOSIS — M810 Age-related osteoporosis without current pathological fracture: Secondary | ICD-10-CM | POA: Insufficient documentation

## 2017-11-19 DIAGNOSIS — I509 Heart failure, unspecified: Secondary | ICD-10-CM

## 2017-11-19 DIAGNOSIS — Z780 Asymptomatic menopausal state: Secondary | ICD-10-CM | POA: Diagnosis not present

## 2017-11-19 NOTE — Progress Notes (Signed)
Good afternoon,  Your bone density scan shows osteoporosis (fragile, thin bones). This increases your risk of a fracture.  I would recommend starting daily calcium-vitamin D supplement (400-600 twice a day). You are also a candidate for medication to reduce fracture risk. There is an injection called Prolia that can be given every 6 months. There are also oral medications. Both are effective.

## 2017-11-20 ENCOUNTER — Telehealth: Payer: Self-pay

## 2017-11-20 NOTE — Telephone Encounter (Signed)
Open in error

## 2017-12-05 ENCOUNTER — Ambulatory Visit: Payer: Self-pay | Admitting: Osteopathic Medicine

## 2018-03-04 ENCOUNTER — Encounter: Payer: Self-pay | Admitting: Osteopathic Medicine

## 2018-03-04 ENCOUNTER — Ambulatory Visit (INDEPENDENT_AMBULATORY_CARE_PROVIDER_SITE_OTHER): Payer: Medicare Other | Admitting: Osteopathic Medicine

## 2018-03-04 VITALS — BP 123/68 | HR 93 | Wt 147.0 lb

## 2018-03-04 DIAGNOSIS — R7303 Prediabetes: Secondary | ICD-10-CM

## 2018-03-04 DIAGNOSIS — Z23 Encounter for immunization: Secondary | ICD-10-CM | POA: Diagnosis not present

## 2018-03-04 DIAGNOSIS — H539 Unspecified visual disturbance: Secondary | ICD-10-CM

## 2018-03-04 LAB — POCT GLYCOSYLATED HEMOGLOBIN (HGB A1C)
HbA1c, POC (prediabetic range): 6 % (ref 5.7–6.4)
Hemoglobin A1C: 6 % — AB (ref 4.0–5.6)

## 2018-03-04 NOTE — Progress Notes (Signed)
HPI: Angelica Mcbride is a 77 y.o. female who  has a past medical history of CHF (congestive heart failure) (HCC) (07/2013), Chronic congestive heart failure (HCC) (08/14/2015), Chronic obstructive pulmonary disease (HCC) (08/14/2015), COPD (chronic obstructive pulmonary disease) (HCC), Essential hypertension (08/14/2015), HTN (hypertension), malignant, Prediabetes (08/14/2015), Respiratory failure (HCC), and Shortness of breath.  [facility], 03/04/18,  for chief complaint of:  Vision issue Sugar recheck   Vision has been blurred. She has appointment with ophtho tomorrow, not too worried about it.  Wanted A1C rechecked. Prediabetes.   Wants flu vax today.   HTN: vitals good.    Patient is accompanied by daughter who assists with history-taking.   Past medical history, surgical history, and family history reviewed.  Current medication list and allergy/intolerance information reviewed.   (See remainder of HPI, ROS, Phys Exam below)    Results for orders placed or performed in visit on 03/04/18 (from the past 72 hour(s))  POCT HgB A1C     Status: Abnormal   Collection Time: 03/04/18 11:52 AM  Result Value Ref Range   Hemoglobin A1C 6.0 (A) 4.0 - 5.6 %   HbA1c POC (<> result, manual entry)     HbA1c, POC (prediabetic range) 6.0 5.7 - 6.4 %   HbA1c, POC (controlled diabetic range)       ASSESSMENT/PLAN: The primary encounter diagnosis was Prediabetes. Diagnoses of Need for immunization against influenza and Vision changes were also pertinent to this visit.   Immunization History  Administered Date(s) Administered  . Influenza, High Dose Seasonal PF 05/14/2016, 05/12/2017  . Influenza,inj,Quad PF,6+ Mos 07/25/2013, 06/21/2014, 06/02/2015, 03/04/2018  . Pneumococcal Conjugate-13 12/08/2015  . Pneumococcal Polysaccharide-23 07/25/2013  . Tdap 05/14/2016      Follow-up plan: Return for medicare visit next few  months.             ############################################ ############################################ ############################################ ############################################    Outpatient Encounter Medications as of 03/04/2018  Medication Sig  . acetaminophen (TYLENOL) 500 MG tablet Take 2 tablets (1,000 mg total) by mouth every 6 (six) hours as needed for mild pain or moderate pain.  Marland Kitchen. albuterol (PROVENTIL HFA;VENTOLIN HFA) 108 (90 Base) MCG/ACT inhaler Inhale 2 puffs into the lungs every 4 (four) hours as needed for wheezing or shortness of breath.  Marland Kitchen. amLODipine (NORVASC) 5 MG tablet TAKE 1 TABLET DAILY  . amLODipine (NORVASC) 5 MG tablet TAKE 1 TABLET DAILY  . aspirin 81 MG tablet Take 1 tablet (81 mg total) by mouth daily.  . furosemide (LASIX) 40 MG tablet TAKE 1 TABLET DAILY  . losartan (COZAAR) 50 MG tablet TAKE 1 TABLET DAILY  . losartan (COZAAR) 50 MG tablet TAKE 1 TABLET DAILY  . pravastatin (PRAVACHOL) 40 MG tablet TAKE 1 TABLET DAILY   No facility-administered encounter medications on file as of 03/04/2018.    No Known Allergies    Review of Systems:  Constitutional: No recent illness  HEENT: No  headache, +vision change  Cardiac: No  chest pain, No  pressure, No palpitations  Respiratory:  No  shortness of breath. No  Cough  Gastrointestinal: No  abdominal pain  Musculoskeletal: No new myalgia/arthralgia  Neurologic: No  weakness, No  Dizziness   Exam:  BP 123/68   Pulse 93   Wt 147 lb (66.7 kg)   BMI 23.02 kg/m   Constitutional: VS see above. General Appearance: alert, well-developed, well-nourished, NAD  Eyes: Normal lids and conjunctive, non-icteric sclera  Ears, Nose, Mouth,  Throat: MMM, Normal external inspection ears/nares/mouth/lips/gums.  Neck: No masses, trachea midline.   Respiratory: Normal respiratory effort.  Musculoskeletal: Gait normal. Symmetric and independent movement of all  extremities  Neurological: Normal balance/coordination. No tremor.  Skin: warm, dry, intact.   Psychiatric: Normal judgment/insight. Normal mood and affect.     Visit summary with medication list and pertinent instructions was printed for patient to review, advised to alert us if any changes needed. All questions at time of visit were answered - patient instructed to contact office with any additional concerns. ER/RTC precautions were reviewed with the patient and understanding verbalized.   Follow-up plan: Return for medicare visit next few months.  Note: Total time spent 10 minutes, greater than 50% of the visit was spent face-to-face counseling and coordinating care for the following: The primary encounter diagnosis was Prediabetes. Diagnoses of Need for immunization against influenza and Vision changes were also pertinent to this visit.Marland Kitchen.  Please note: voice recognition software was used to produce this document, and typos may escape review. Please contact Dr. Lyn HollingsheadAlexander for any needed clarifications.

## 2018-04-06 ENCOUNTER — Other Ambulatory Visit: Payer: Self-pay | Admitting: Osteopathic Medicine

## 2018-04-06 DIAGNOSIS — I509 Heart failure, unspecified: Secondary | ICD-10-CM

## 2018-05-15 ENCOUNTER — Encounter: Payer: Self-pay | Admitting: Osteopathic Medicine

## 2018-05-20 NOTE — Progress Notes (Deleted)
Subjective:   Angelica Mcbride is a 76 y.o. female who presents for Medicare Annual (Subsequent) preventive examination.  Review of Systems:  No ROS.  Medicare Wellness Visit. Additional risk factors are reflected in the social history.    Sleep patterns: Home Safety/Smoke Alarms: Feels safe in home. Smoke alarms in place.  Living environment;     Female:   Pap- aged out unless necessary       Mammo- aged out unless necessary      Dexa scan- utd       CCS- aged out unless necessary     Objective:     Vitals: There were no vitals taken for this visit.  There is no height or weight on file to calculate BMI.  Advanced Directives 06/02/2015 11/25/2014 06/21/2014 07/29/2013  Does Patient Have a Medical Advance Directive? No Yes No Patient does not have advance directive  Type of Advance Directive - Healthcare Power of Attorney - -  Copy of Healthcare Power of Attorney in Chart? - No - copy requested - -  Would patient like information on creating a medical advance directive? Yes - Transport planner given - No - patient declined information -    Tobacco Social History   Tobacco Use  Smoking Status Current Every Day Smoker  . Packs/day: 1.00  . Years: 51.00  . Pack years: 51.00  . Types: Cigarettes  . Start date: 07/15/1961  Smokeless Tobacco Never Used     Ready to quit: Not Answered Counseling given: Not Answered   Clinical Intake:                       Past Medical History:  Diagnosis Date  . CHF (congestive heart failure) (HCC) 07/2013   ACUTE  . Chronic congestive heart failure (HCC) 08/14/2015  . Chronic obstructive pulmonary disease (HCC) 08/14/2015  . COPD (chronic obstructive pulmonary disease) (HCC)   . Essential hypertension 08/14/2015  . HTN (hypertension), malignant    HX OF  . Prediabetes 08/14/2015  . Respiratory failure (HCC)   . Shortness of breath    No past surgical history on file. Family History  Problem Relation Age of Onset   . Diabetes Daughter   . Hypertension Father   . Stroke Brother   . Heart attack Mother    Social History   Socioeconomic History  . Marital status: Single    Spouse name: Not on file  . Number of children: Not on file  . Years of education: Not on file  . Highest education level: Not on file  Occupational History  . Not on file  Social Needs  . Financial resource strain: Not on file  . Food insecurity:    Worry: Not on file    Inability: Not on file  . Transportation needs:    Medical: Not on file    Non-medical: Not on file  Tobacco Use  . Smoking status: Current Every Day Smoker    Packs/day: 1.00    Years: 51.00    Pack years: 51.00    Types: Cigarettes    Start date: 07/15/1961  . Smokeless tobacco: Never Used  Substance and Sexual Activity  . Alcohol use: No    Alcohol/week: 0.0 standard drinks  . Drug use: No  . Sexual activity: Not on file  Lifestyle  . Physical activity:    Days per week: Not on file    Minutes per session: Not on file  . Stress: Not  on file  Relationships  . Social connections:    Talks on phone: Not on file    Gets together: Not on file    Attends religious service: Not on file    Active member of club or organization: Not on file    Attends meetings of clubs or organizations: Not on file    Relationship status: Not on file  Other Topics Concern  . Not on file  Social History Narrative   ** Merged History Encounter **        Outpatient Encounter Medications as of 05/26/2018  Medication Sig  . acetaminophen (TYLENOL) 500 MG tablet Take 2 tablets (1,000 mg total) by mouth every 6 (six) hours as needed for mild pain or moderate pain.  Marland Kitchen albuterol (PROVENTIL HFA;VENTOLIN HFA) 108 (90 Base) MCG/ACT inhaler Inhale 2 puffs into the lungs every 4 (four) hours as needed for wheezing or shortness of breath.  Marland Kitchen amLODipine (NORVASC) 5 MG tablet TAKE 1 TABLET DAILY  . amLODipine (NORVASC) 5 MG tablet TAKE 1 TABLET DAILY  . amLODipine  (NORVASC) 5 MG tablet TAKE 1 TABLET DAILY  . aspirin 81 MG tablet Take 1 tablet (81 mg total) by mouth daily.  . furosemide (LASIX) 40 MG tablet TAKE 1 TABLET DAILY  . losartan (COZAAR) 50 MG tablet TAKE 1 TABLET DAILY  . losartan (COZAAR) 50 MG tablet TAKE 1 TABLET DAILY  . losartan (COZAAR) 50 MG tablet TAKE 1 TABLET DAILY  . pravastatin (PRAVACHOL) 40 MG tablet TAKE 1 TABLET DAILY  . pravastatin (PRAVACHOL) 40 MG tablet TAKE 1 TABLET DAILY   No facility-administered encounter medications on file as of 05/26/2018.     Activities of Daily Living In your present state of health, do you have any difficulty performing the following activities: 03/04/2018  Hearing? N  Vision? N  Difficulty concentrating or making decisions? N  Walking or climbing stairs? N  Dressing or bathing? N  Doing errands, shopping? N  Some recent data might be hidden    Patient Care Team: Sunnie Nielsen, DO as PCP - General (Osteopathic Medicine) Berton Bon, MD (Inactive)    Assessment:   This is a routine wellness examination for Kinya.Physical assessment deferred to PCP.   Exercise Activities and Dietary recommendations   Diet  Breakfast: Lunch:  Dinner:       Goals   None     Fall Risk Fall Risk  03/04/2018 11/11/2016 06/02/2015 06/21/2014 08/02/2013  Falls in the past year? No No No No No   Is the patient's home free of loose throw rugs in walkways, pet beds, electrical cords, etc?   {Blank single:19197::"yes","no"}      Grab bars in the bathroom? {Blank single:19197::"yes","no"}      Handrails on the stairs?   {Blank single:19197::"yes","no"}      Adequate lighting?   {Blank single:19197::"yes","no"}   Depression Screen PHQ 2/9 Scores 03/04/2018 11/10/2017 05/12/2017 11/11/2016  PHQ - 2 Score 0 0 0 0  PHQ- 9 Score - 0 0 -     Cognitive Function        Immunization History  Administered Date(s) Administered  . Influenza, High Dose Seasonal PF 05/14/2016, 05/12/2017  .  Influenza,inj,Quad PF,6+ Mos 07/25/2013, 06/21/2014, 06/02/2015, 03/04/2018  . Pneumococcal Conjugate-13 12/08/2015  . Pneumococcal Polysaccharide-23 07/25/2013  . Tdap 05/14/2016    Screening Tests Health Maintenance  Topic Date Due  . TETANUS/TDAP  05/14/2026  . INFLUENZA VACCINE  Completed  . DEXA SCAN  Completed  .  PNA vac Low Risk Adult  Completed        Plan:   ***   I have personally reviewed and noted the following in the patient's chart:   . Medical and social history . Use of alcohol, tobacco or illicit drugs  . Current medications and supplements . Functional ability and status . Nutritional status . Physical activity . Advanced directives . List of other physicians . Hospitalizations, surgeries, and ER visits in previous 12 months . Vitals . Screenings to include cognitive, depression, and falls . Referrals and appointments  In addition, I have reviewed and discussed with patient certain preventive protocols, quality metrics, and best practice recommendations. A written personalized care plan for preventive services as well as general preventive health recommendations were provided to patient.     Normand Sloop, LPN  [id number]

## 2018-05-26 ENCOUNTER — Ambulatory Visit: Payer: Self-pay

## 2018-06-17 NOTE — Progress Notes (Signed)
Subjective:   Mellissa Conley is a 77 y.o. female who presents for Medicare Annual (Subsequent) preventive examination.  Review of Systems:  No ROS.  Medicare Wellness Visit. Additional risk factors are reflected in the social history.  Cardiac Risk Factors include: hypertension;smoking/ tobacco exposure;advanced age (>46men, >71 women);sedentary lifestyle Sleep patterns:Getting 4hours of sleep in increments during the day. So 8 hours and gets up 2 times during the day to use the bathroom. Sleeps when daughter gets home from work on night shift. Home Safety/Smoke Alarms: Feels safe in home. Smoke alarms in place.  Living environment; Lives with daughter in 1 story home. No steps in the house. Shower is a step over tub so does not use the shower. Washes off at the sink. Seat Belt Safety/Bike Helmet: Wears seat belt.   Female:   Pap- aged out      Mammo- declined      Dexa scan-  utd      CCS- offered but pt declined     Objective:     Vitals: BP 102/62   Pulse 84   Ht 5\' 7"  (1.702 m)   Wt 144 lb (65.3 kg)   SpO2 95%   BMI 22.55 kg/m   Body mass index is 22.55 kg/m.  Advanced Directives 06/23/2018 06/02/2015 11/25/2014 06/21/2014 07/29/2013  Does Patient Have a Medical Advance Directive? No No Yes No Patient does not have advance directive  Type of Advance Directive - - Healthcare Power of Attorney - -  Copy of Healthcare Power of Attorney in Chart? - - No - copy requested - -  Would patient like information on creating a medical advance directive? Yes (MAU/Ambulatory/Procedural Areas - Information given) Yes - Educational materials given - No - patient declined information -    Tobacco Social History   Tobacco Use  Smoking Status Current Every Day Smoker  . Packs/day: 1.00  . Years: 51.00  . Pack years: 51.00  . Types: Cigarettes  . Start date: 07/15/1961  Smokeless Tobacco Never Used     Ready to quit: Not Answered Counseling given: Not Answered   Clinical  Intake:  Pre-visit preparation completed: Yes  Pain : No/denies pain     Nutritional Risks: None Diabetes: No  How often do you need to have someone help you when you read instructions, pamphlets, or other written materials from your doctor or pharmacy?: 1 - Never What is the last grade level you completed in school?: 14  Interpreter Needed?: No  Information entered by :: Carolin Sicks, LPN  Past Medical History:  Diagnosis Date  . CHF (congestive heart failure) (HCC) 07/2013   ACUTE  . Chronic congestive heart failure (HCC) 08/14/2015  . Chronic obstructive pulmonary disease (HCC) 08/14/2015  . COPD (chronic obstructive pulmonary disease) (HCC)   . Essential hypertension 08/14/2015  . HTN (hypertension), malignant    HX OF  . Prediabetes 08/14/2015  . Respiratory failure (HCC)   . Shortness of breath    History reviewed. No pertinent surgical history. Family History  Problem Relation Age of Onset  . Diabetes Daughter   . Hypertension Father   . Stroke Brother   . Heart attack Mother    Social History   Socioeconomic History  . Marital status: Single    Spouse name: Not on file  . Number of children: 1  . Years of education: 38  . Highest education level: Associate degree: academic program  Occupational History  . Occupation: retired    Comment: Arts administrator  Operator  Social Needs  . Financial resource strain: Not hard at all  . Food insecurity:    Worry: Never true    Inability: Never true  . Transportation needs:    Medical: No    Non-medical: No  Tobacco Use  . Smoking status: Current Every Day Smoker    Packs/day: 1.00    Years: 51.00    Pack years: 51.00    Types: Cigarettes    Start date: 07/15/1961  . Smokeless tobacco: Never Used  Substance and Sexual Activity  . Alcohol use: No    Alcohol/week: 0.0 standard drinks  . Drug use: No  . Sexual activity: Not Currently  Lifestyle  . Physical activity:    Days per week: 0 days    Minutes per session: 0  min  . Stress: Not at all  Relationships  . Social connections:    Talks on phone: Never    Gets together: Never    Attends religious service: Never    Active member of club or organization: No    Attends meetings of clubs or organizations: Never    Relationship status: Never married  Other Topics Concern  . Not on file  Social History Narrative   Stays at home mostly. Lives with daughter. Sleeps when her daughter is at home. Does do house work. Drinks coffee in the mornings.    Works puzzles alot    Outpatient Encounter Medications as of 06/23/2018  Medication Sig  . acetaminophen (TYLENOL) 500 MG tablet Take 2 tablets (1,000 mg total) by mouth every 6 (six) hours as needed for mild pain or moderate pain.  Marland Kitchen albuterol (PROVENTIL HFA;VENTOLIN HFA) 108 (90 Base) MCG/ACT inhaler Inhale 2 puffs into the lungs every 4 (four) hours as needed for wheezing or shortness of breath.  Marland Kitchen amLODipine (NORVASC) 5 MG tablet TAKE 1 TABLET DAILY  . aspirin 81 MG tablet Take 1 tablet (81 mg total) by mouth daily.  . furosemide (LASIX) 40 MG tablet TAKE 1 TABLET DAILY  . losartan (COZAAR) 50 MG tablet TAKE 1 TABLET DAILY  . pravastatin (PRAVACHOL) 40 MG tablet TAKE 1 TABLET DAILY  . pravastatin (PRAVACHOL) 40 MG tablet TAKE 1 TABLET DAILY (Patient not taking: Reported on 06/23/2018)  . [DISCONTINUED] amLODipine (NORVASC) 5 MG tablet TAKE 1 TABLET DAILY  . [DISCONTINUED] amLODipine (NORVASC) 5 MG tablet TAKE 1 TABLET DAILY  . [DISCONTINUED] losartan (COZAAR) 50 MG tablet TAKE 1 TABLET DAILY  . [DISCONTINUED] losartan (COZAAR) 50 MG tablet TAKE 1 TABLET DAILY   No facility-administered encounter medications on file as of 06/23/2018.     Activities of Daily Living In your present state of health, do you have any difficulty performing the following activities: 06/23/2018 03/04/2018  Hearing? N N  Vision? N N  Difficulty concentrating or making decisions? N N  Walking or climbing stairs? N N  Dressing  or bathing? N N  Doing errands, shopping? N N  Preparing Food and eating ? N -  Using the Toilet? N -  In the past six months, have you accidently leaked urine? N -  Do you have problems with loss of bowel control? N -  Managing your Medications? N -  Managing your Finances? N -  Housekeeping or managing your Housekeeping? N -  Some recent data might be hidden    Patient Care Team: Sunnie Nielsen, DO as PCP - General (Osteopathic Medicine) Berton Bon, MD (Inactive)    Assessment:   This is a  routine wellness examination for Aurea GraffJoan.Physical assessment deferred to PCP.   Exercise Activities and Dietary recommendations Current Exercise Habits: The patient does not participate in regular exercise at present, Exercise limited by: cardiac condition(s);Other - see comments(copd) Diet . Lactose intolerant. Eats a pretty healthy diet. Breakfast:oatmeal toast coffee Lunch: skipped states she is trying to loose weight Dinner:  Meat and vegetables  Drinks water daily.    Goals   None     Fall Risk Fall Risk  06/23/2018 03/04/2018 11/11/2016 06/02/2015 06/21/2014  Falls in the past year? 0 No No No No   Is the patient's home free of loose throw rugs in walkways, pet beds, electrical cords, etc?   yes      Grab bars in the bathroom? no      Handrails on the stairs?   no      Adequate lighting?   yes   Depression Screen PHQ 2/9 Scores 06/23/2018 03/04/2018 11/10/2017 05/12/2017  PHQ - 2 Score 0 0 0 0  PHQ- 9 Score - - 0 0     Cognitive Function     6CIT Screen 06/23/2018  What Year? 0 points  What month? 0 points  What time? 0 points  Count back from 20 0 points  Months in reverse 0 points  Repeat phrase 0 points  Total Score 0    Immunization History  Administered Date(s) Administered  . Influenza, High Dose Seasonal PF 05/14/2016, 05/12/2017  . Influenza,inj,Quad PF,6+ Mos 07/25/2013, 06/21/2014, 06/02/2015, 03/04/2018  . Pneumococcal Conjugate-13  12/08/2015  . Pneumococcal Polysaccharide-23 07/25/2013  . Tdap 05/14/2016    Screening Tests Health Maintenance  Topic Date Due  . TETANUS/TDAP  05/14/2026  . INFLUENZA VACCINE  Completed  . DEXA SCAN  Completed  . PNA vac Low Risk Adult  Completed        Plan:      Ms. Raeford RazorDownings , Thank you for taking time to come for your Medicare Wellness Visit. I appreciate your ongoing commitment to your health goals. Please review the following plan we discussed and let me know if I can assist you in the future.  Please schedule your next medicare wellness visit with me in 1 yr. Continue doing brain stimulating activities (puzzles, reading, adult coloring books, staying active) to keep memory sharp.   Bring a copy of your living will and/or healthcare power of attorney to your next office visit.   These are the goals we discussed: Goals   None     This is a list of the screening recommended for you and due dates:  Health Maintenance  Topic Date Due  . Tetanus Vaccine  05/14/2026  . Flu Shot  Completed  . DEXA scan (bone density measurement)  Completed  . Pneumonia vaccines  Completed      I have personally reviewed and noted the following in the patient's chart:   . Medical and social history . Use of alcohol, tobacco or illicit drugs  . Current medications and supplements . Functional ability and status . Nutritional status . Physical activity . Advanced directives . List of other physicians . Hospitalizations, surgeries, and ER visits in previous 12 months . Vitals . Screenings to include cognitive, depression, and falls . Referrals and appointments  In addition, I have reviewed and discussed with patient certain preventive protocols, quality metrics, and best practice recommendations. A written personalized care plan for preventive services as well as general preventive health recommendations were provided to patient.  Normand Sloop,  LPN  [id number]

## 2018-06-23 ENCOUNTER — Ambulatory Visit (INDEPENDENT_AMBULATORY_CARE_PROVIDER_SITE_OTHER): Payer: Medicare Other | Admitting: *Deleted

## 2018-06-23 VITALS — BP 102/62 | HR 84 | Ht 67.0 in | Wt 144.0 lb

## 2018-06-23 DIAGNOSIS — Z0000 Encounter for general adult medical examination without abnormal findings: Secondary | ICD-10-CM

## 2018-06-23 NOTE — Patient Instructions (Addendum)
Ms. Kinzie , Thank you for taking time to come for your Medicare Wellness Visit. I appreciate your ongoing commitment to your health goals. Please review the following plan we discussed and let me know if I can assist you in the future.  Please schedule your next medicare wellness visit with me in 1 yr. Continue doing brain stimulating activities (puzzles, reading, adult coloring books, staying active) to keep memory sharp.   Bring a copy of your living will and/or healthcare power of attorney to your next office visit.Health Maintenance, Female Adopting a healthy lifestyle and getting preventive care can go a long way to promote health and wellness. Talk with your health care provider about what schedule of regular examinations is right for you. This is a good chance for you to check in with your provider about disease prevention and staying healthy. In between checkups, there are plenty of things you can do on your own. Experts have done a lot of research about which lifestyle changes and preventive measures are most likely to keep you healthy. Ask your health care provider for more information. Weight and diet Eat a healthy diet  Be sure to include plenty of vegetables, fruits, low-fat dairy products, and lean protein.  Do not eat a lot of foods high in solid fats, added sugars, or salt.  Get regular exercise. This is one of the most important things you can do for your health. ? Most adults should exercise for at least 150 minutes each week. The exercise should increase your heart rate and make you sweat (moderate-intensity exercise). ? Most adults should also do strengthening exercises at least twice a week. This is in addition to the moderate-intensity exercise.  Maintain a healthy weight  Body mass index (BMI) is a measurement that can be used to identify possible weight problems. It estimates body fat based on height and weight. Your health care provider can help determine your BMI and  help you achieve or maintain a healthy weight.  For females 54 years of age and older: ? A BMI below 18.5 is considered underweight. ? A BMI of 18.5 to 24.9 is normal. ? A BMI of 25 to 29.9 is considered overweight. ? A BMI of 30 and above is considered obese.  Watch levels of cholesterol and blood lipids  You should start having your blood tested for lipids and cholesterol at 76 years of age, then have this test every 5 years.  You may need to have your cholesterol levels checked more often if: ? Your lipid or cholesterol levels are high. ? You are older than 76 years of age. ? You are at high risk for heart disease.  Cancer screening Lung Cancer  Lung cancer screening is recommended for adults 44-49 years old who are at high risk for lung cancer because of a history of smoking.  A yearly low-dose CT scan of the lungs is recommended for people who: ? Currently smoke. ? Have quit within the past 15 years. ? Have at least a 30-pack-year history of smoking. A pack year is smoking an average of one pack of cigarettes a day for 1 year.  Yearly screening should continue until it has been 15 years since you quit.  Yearly screening should stop if you develop a health problem that would prevent you from having lung cancer treatment.  Breast Cancer  Practice breast self-awareness. This means understanding how your breasts normally appear and feel.  It also means doing regular breast self-exams. Let your  health care provider know about any changes, no matter how small.  If you are in your 20s or 30s, you should have a clinical breast exam (CBE) by a health care provider every 1-3 years as part of a regular health exam.  If you are 51 or older, have a CBE every year. Also consider having a breast X-ray (mammogram) every year.  If you have a family history of breast cancer, talk to your health care provider about genetic screening.  If you are at high risk for breast cancer, talk to  your health care provider about having an MRI and a mammogram every year.  Breast cancer gene (BRCA) assessment is recommended for women who have family members with BRCA-related cancers. BRCA-related cancers include: ? Breast. ? Ovarian. ? Tubal. ? Peritoneal cancers.  Results of the assessment will determine the need for genetic counseling and BRCA1 and BRCA2 testing.  Cervical Cancer Your health care provider may recommend that you be screened regularly for cancer of the pelvic organs (ovaries, uterus, and vagina). This screening involves a pelvic examination, including checking for microscopic changes to the surface of your cervix (Pap test). You may be encouraged to have this screening done every 3 years, beginning at age 3.  For women ages 53-65, health care providers may recommend pelvic exams and Pap testing every 3 years, or they may recommend the Pap and pelvic exam, combined with testing for human papilloma virus (HPV), every 5 years. Some types of HPV increase your risk of cervical cancer. Testing for HPV may also be done on women of any age with unclear Pap test results.  Other health care providers may not recommend any screening for nonpregnant women who are considered low risk for pelvic cancer and who do not have symptoms. Ask your health care provider if a screening pelvic exam is right for you.  If you have had past treatment for cervical cancer or a condition that could lead to cancer, you need Pap tests and screening for cancer for at least 20 years after your treatment. If Pap tests have been discontinued, your risk factors (such as having a new sexual partner) need to be reassessed to determine if screening should resume. Some women have medical problems that increase the chance of getting cervical cancer. In these cases, your health care provider may recommend more frequent screening and Pap tests.  Colorectal Cancer  This type of cancer can be detected and often  prevented.  Routine colorectal cancer screening usually begins at 76 years of age and continues through 76 years of age.  Your health care provider may recommend screening at an earlier age if you have risk factors for colon cancer.  Your health care provider may also recommend using home test kits to check for hidden blood in the stool.  A small camera at the end of a tube can be used to examine your colon directly (sigmoidoscopy or colonoscopy). This is done to check for the earliest forms of colorectal cancer.  Routine screening usually begins at age 65.  Direct examination of the colon should be repeated every 5-10 years through 76 years of age. However, you may need to be screened more often if early forms of precancerous polyps or small growths are found.  Skin Cancer  Check your skin from head to toe regularly.  Tell your health care provider about any new moles or changes in moles, especially if there is a change in a mole's shape or color.  Also  tell your health care provider if you have a mole that is larger than the size of a pencil eraser.  Always use sunscreen. Apply sunscreen liberally and repeatedly throughout the day.  Protect yourself by wearing long sleeves, pants, a wide-brimmed hat, and sunglasses whenever you are outside.  Heart disease, diabetes, and high blood pressure  High blood pressure causes heart disease and increases the risk of stroke. High blood pressure is more likely to develop in: ? People who have blood pressure in the high end of the normal range (130-139/85-89 mm Hg). ? People who are overweight or obese. ? People who are African American.  If you are 70-88 years of age, have your blood pressure checked every 3-5 years. If you are 70 years of age or older, have your blood pressure checked every year. You should have your blood pressure measured twice-once when you are at a hospital or clinic, and once when you are not at a hospital or clinic.  Record the average of the two measurements. To check your blood pressure when you are not at a hospital or clinic, you can use: ? An automated blood pressure machine at a pharmacy. ? A home blood pressure monitor.  If you are between 31 years and 20 years old, ask your health care provider if you should take aspirin to prevent strokes.  Have regular diabetes screenings. This involves taking a blood sample to check your fasting blood sugar level. ? If you are at a normal weight and have a low risk for diabetes, have this test once every three years after 76 years of age. ? If you are overweight and have a high risk for diabetes, consider being tested at a younger age or more often. Preventing infection Hepatitis B  If you have a higher risk for hepatitis B, you should be screened for this virus. You are considered at high risk for hepatitis B if: ? You were born in a country where hepatitis B is common. Ask your health care provider which countries are considered high risk. ? Your parents were born in a high-risk country, and you have not been immunized against hepatitis B (hepatitis B vaccine). ? You have HIV or AIDS. ? You use needles to inject street drugs. ? You live with someone who has hepatitis B. ? You have had sex with someone who has hepatitis B. ? You get hemodialysis treatment. ? You take certain medicines for conditions, including cancer, organ transplantation, and autoimmune conditions.  Hepatitis C  Blood testing is recommended for: ? Everyone born from [birth year] through 1964. ? Anyone with known risk factors for hepatitis C.  Sexually transmitted infections (STIs)  You should be screened for sexually transmitted infections (STIs) including gonorrhea and chlamydia if: ? You are sexually active and are younger than 76 years of age. ? You are older than 76 years of age and your health care provider tells you that you are at risk for this type of infection. ? Your sexual  activity has changed since you were last screened and you are at an increased risk for chlamydia or gonorrhea. Ask your health care provider if you are at risk.  If you do not have HIV, but are at risk, it may be recommended that you take a prescription medicine daily to prevent HIV infection. This is called pre-exposure prophylaxis (PrEP). You are considered at risk if: ? You are sexually active and do not regularly use condoms or know the HIV status of your  partner(s). ? You take drugs by injection. ? You are sexually active with a partner who has HIV.  Talk with your health care provider about whether you are at high risk of being infected with HIV. If you choose to begin PrEP, you should first be tested for HIV. You should then be tested every 3 months for as long as you are taking PrEP. Pregnancy  If you are premenopausal and you may become pregnant, ask your health care provider about preconception counseling.  If you may become pregnant, take 400 to 800 micrograms (mcg) of folic acid every day.  If you want to prevent pregnancy, talk to your health care provider about birth control (contraception). Osteoporosis and menopause  Osteoporosis is a disease in which the bones lose minerals and strength with aging. This can result in serious bone fractures. Your risk for osteoporosis can be identified using a bone density scan.  If you are 34 years of age or older, or if you are at risk for osteoporosis and fractures, ask your health care provider if you should be screened.  Ask your health care provider whether you should take a calcium or vitamin D supplement to lower your risk for osteoporosis.  Menopause may have certain physical symptoms and risks.  Hormone replacement therapy may reduce some of these symptoms and risks. Talk to your health care provider about whether hormone replacement therapy is right for you. Follow these instructions at home:  Schedule regular health, dental,  and eye exams.  Stay current with your immunizations.  Do not use any tobacco products including cigarettes, chewing tobacco, or electronic cigarettes.  If you are pregnant, do not drink alcohol.  If you are breastfeeding, limit how much and how often you drink alcohol.  Limit alcohol intake to no more than 1 drink per day for nonpregnant women. One drink equals 12 ounces of beer, 5 ounces of wine, or 1 ounces of hard liquor.  Do not use street drugs.  Do not share needles.  Ask your health care provider for help if you need support or information about quitting drugs.  Tell your health care provider if you often feel depressed.  Tell your health care provider if you have ever been abused or do not feel safe at home. This information is not intended to replace advice given to you by your health care provider. Make sure you discuss any questions you have with your health care provider. Document Released: 01/14/2011 Document Revised: 12/07/2015 Document Reviewed: 04/04/2015 Elsevier Interactive Patient Education  [employer].

## 2018-08-22 ENCOUNTER — Other Ambulatory Visit: Payer: Self-pay | Admitting: Osteopathic Medicine

## 2018-08-22 DIAGNOSIS — I509 Heart failure, unspecified: Secondary | ICD-10-CM

## 2018-08-23 ENCOUNTER — Other Ambulatory Visit: Payer: Self-pay | Admitting: Osteopathic Medicine

## 2018-11-09 ENCOUNTER — Ambulatory Visit: Payer: Self-pay | Admitting: Osteopathic Medicine

## 2018-11-19 ENCOUNTER — Ambulatory Visit: Payer: Self-pay | Admitting: Osteopathic Medicine

## 2018-11-23 ENCOUNTER — Ambulatory Visit (INDEPENDENT_AMBULATORY_CARE_PROVIDER_SITE_OTHER): Payer: Medicare Other | Admitting: Osteopathic Medicine

## 2018-11-23 ENCOUNTER — Encounter: Payer: Self-pay | Admitting: Osteopathic Medicine

## 2018-11-23 VITALS — BP 155/96 | Temp 99.6°F | Wt 154.2 lb

## 2018-11-23 DIAGNOSIS — I10 Essential (primary) hypertension: Secondary | ICD-10-CM

## 2018-11-23 DIAGNOSIS — I509 Heart failure, unspecified: Secondary | ICD-10-CM

## 2018-11-23 DIAGNOSIS — R7303 Prediabetes: Secondary | ICD-10-CM | POA: Diagnosis not present

## 2018-11-23 MED ORDER — FUROSEMIDE 40 MG PO TABS: 40.0000 mg | ORAL_TABLET | Freq: Every day | ORAL | 3 refills | Status: DC

## 2018-11-23 MED ORDER — LOSARTAN POTASSIUM 50 MG PO TABS: 50.0000 mg | ORAL_TABLET | Freq: Every day | ORAL | 1 refills | Status: DC

## 2018-11-23 MED ORDER — AMLODIPINE BESYLATE 5 MG PO TABS: 5.0000 mg | ORAL_TABLET | Freq: Every day | ORAL | 3 refills | Status: DC

## 2018-11-23 MED ORDER — PRAVASTATIN SODIUM 40 MG PO TABS: 40.0000 mg | ORAL_TABLET | Freq: Every day | ORAL | 3 refills | Status: DC

## 2018-11-23 NOTE — Progress Notes (Signed)
Virtual Visit via Video (App used: Doximity) Note  I connected with      Angelica Mcbride on 11/23/18 at 10:00 by a telemedicine application and verified that I am speaking with the correct person using two identifiers.  Patient is at home I am working from home   I discussed the limitations of evaluation and management by telemedicine and the availability of in person appointments. The patient expressed understanding and agreed to proceed.  History of Present Illness: Angelica Mcbride is a 78 y.o. female who would like to discuss  Chief Complaint  Patient presents with  . Follow-up    No new concerns   Due for A1C recheck, history prediabetes.  UTD on vaccines and medicare wellness visit done 06/2018 Needs shingles vaccine  Last routine labs about a year ago - lipids and CMP ok  Daughter assists w/ history, notes no problems or changes       Observations/Objective: BP (!) 155/96 (BP Location: Left Arm, Patient Position: Sitting, Cuff Size: Normal)   Temp 99.6 F (37.6 C) (Oral)   Wt 154 lb 3.2 oz (69.9 kg)   BMI 24.15 kg/m  BP Readings from Last 3 Encounters:  11/23/18 (!) 155/96  06/23/18 102/62  03/04/18 123/68   Exam: Normal Speech.  NAD   Lab and Radiology Results No results found for this or any previous visit (from the past 72 hour(s)). No results found.     Assessment and Plan: 78 y.o. female with The primary encounter diagnosis was Prediabetes. Diagnoses of Essential hypertension and Chronic congestive heart failure (HCC) were also pertinent to this visit.   PDMP not reviewed this encounter. Orders Placed This Encounter  Procedures  . CBC  . COMPLETE METABOLIC PANEL WITH GFR  . Lipid panel  . TSH  . Hemoglobin A1c   Meds ordered this encounter  Medications  . amLODipine (NORVASC) 5 MG tablet    Sig: Take 1 tablet (5 mg total) by mouth daily.    Dispense:  90 tablet    Refill:  3  . furosemide (LASIX) 40 MG tablet    Sig: Take 1 tablet (40  mg total) by mouth daily.    Dispense:  90 tablet    Refill:  3  . losartan (COZAAR) 50 MG tablet    Sig: Take 1 tablet (50 mg total) by mouth daily.    Dispense:  90 tablet    Refill:  1  . pravastatin (PRAVACHOL) 40 MG tablet    Sig: Take 1 tablet (40 mg total) by mouth daily.    Dispense:  90 tablet    Refill:  3   There are no Patient Instructions on file for this visit.  Instructions sent via MyChart. If MyChart not available, pt was given option for info via personal e-mail w/ no guarantee of protected health info over unsecured e-mail communication, and MyChart sign-up instructions were included.   Follow Up Instructions: No follow-ups on file.    I discussed the assessment and treatment plan with the patient. The patient was provided an opportunity to ask questions and all were answered. The patient agreed with the plan and demonstrated an understanding of the instructions.   The patient was advised to call back or seek an in-person evaluation if any new concerns, if symptoms worsen or if the condition fails to improve as anticipated.  15 minutes of non-face-to-face time was provided during this encounter.  Historical information moved to improve visibility of documentation.  Past Medical History:  Diagnosis Date  . CHF (congestive heart failure) (HCC) 07/2013   ACUTE  . Chronic congestive heart failure (HCC) 08/14/2015  . Chronic obstructive pulmonary disease (HCC) 08/14/2015  . COPD (chronic obstructive pulmonary disease) (HCC)   . Essential hypertension 08/14/2015  . HTN (hypertension), malignant    HX OF  . Prediabetes 08/14/2015  . Respiratory failure (HCC)   . Shortness of breath    No past surgical history on file. Social History   Tobacco Use  . Smoking status: Current Every Day Smoker    Packs/day: 1.00    Years: 51.00    Pack years: 51.00    Types: Cigarettes    Start date: 07/15/1961  . Smokeless tobacco: Never  Used  Substance Use Topics  . Alcohol use: No    Alcohol/week: 0.0 standard drinks   family history includes Diabetes in her daughter; Heart attack in her mother; Hypertension in her father; Stroke in her brother.  Medications: Current Outpatient Medications  Medication Sig Dispense Refill  . acetaminophen (TYLENOL) 500 MG tablet Take 2 tablets (1,000 mg total) by mouth every 6 (six) hours as needed for mild pain or moderate pain. 30 tablet   . albuterol (PROVENTIL HFA;VENTOLIN HFA) 108 (90 Base) MCG/ACT inhaler Inhale 2 puffs into the lungs every 4 (four) hours as needed for wheezing or shortness of breath. 1 Inhaler 3  . amLODipine (NORVASC) 5 MG tablet Take 1 tablet (5 mg total) by mouth daily. 90 tablet 3  . aspirin 81 MG tablet Take 1 tablet (81 mg total) by mouth daily. 90 tablet 1  . furosemide (LASIX) 40 MG tablet Take 1 tablet (40 mg total) by mouth daily. 90 tablet 3  . losartan (COZAAR) 50 MG tablet Take 1 tablet (50 mg total) by mouth daily. 90 tablet 1  . pravastatin (PRAVACHOL) 40 MG tablet Take 1 tablet (40 mg total) by mouth daily. 90 tablet 3   No current facility-administered medications for this visit.    No Known Allergies  PDMP not reviewed this encounter. Orders Placed This Encounter  Procedures  . CBC  . COMPLETE METABOLIC PANEL WITH GFR  . Lipid panel  . TSH  . Hemoglobin A1c   Meds ordered this encounter  Medications  . amLODipine (NORVASC) 5 MG tablet    Sig: Take 1 tablet (5 mg total) by mouth daily.    Dispense:  90 tablet    Refill:  3  . furosemide (LASIX) 40 MG tablet    Sig: Take 1 tablet (40 mg total) by mouth daily.    Dispense:  90 tablet    Refill:  3  . losartan (COZAAR) 50 MG tablet    Sig: Take 1 tablet (50 mg total) by mouth daily.    Dispense:  90 tablet    Refill:  1  . pravastatin (PRAVACHOL) 40 MG tablet    Sig: Take 1 tablet (40 mg total) by mouth daily.    Dispense:  90 tablet    Refill:  3

## 2018-11-24 DIAGNOSIS — R7303 Prediabetes: Secondary | ICD-10-CM | POA: Diagnosis not present

## 2018-11-24 DIAGNOSIS — I10 Essential (primary) hypertension: Secondary | ICD-10-CM | POA: Diagnosis not present

## 2018-11-25 LAB — COMPLETE METABOLIC PANEL WITH GFR
AG Ratio: 1.4 (calc) (ref 1.0–2.5)
ALT: 10 U/L (ref 6–29)
AST: 15 U/L (ref 10–35)
Albumin: 4.1 g/dL (ref 3.6–5.1)
Alkaline phosphatase (APISO): 54 U/L (ref 37–153)
BUN/Creatinine Ratio: 12 (calc) (ref 6–22)
BUN: 12 mg/dL (ref 7–25)
CO2: 31 mmol/L (ref 20–32)
Calcium: 9.1 mg/dL (ref 8.6–10.4)
Chloride: 108 mmol/L (ref 98–110)
Creat: 1.02 mg/dL — ABNORMAL HIGH (ref 0.60–0.93)
GFR, Est African American: 62 mL/min/{1.73_m2} (ref >=60)
GFR, Est Non African American: 53 mL/min/{1.73_m2} — ABNORMAL LOW (ref >=60)
Globulin: 2.9 g/dL (calc) (ref 1.9–3.7)
Glucose, Bld: 101 mg/dL — ABNORMAL HIGH (ref 65–99)
Potassium: 3.9 mmol/L (ref 3.5–5.3)
Sodium: 145 mmol/L (ref 135–146)
Total Bilirubin: 0.3 mg/dL (ref 0.2–1.2)
Total Protein: 7 g/dL (ref 6.1–8.1)

## 2018-11-25 LAB — CBC
HCT: 41.8 % (ref 35.0–45.0)
Hemoglobin: 14.2 g/dL (ref 11.7–15.5)
MCH: 32.1 pg (ref 27.0–33.0)
MCHC: 34 g/dL (ref 32.0–36.0)
MCV: 94.4 fL (ref 80.0–100.0)
MPV: 11.9 fL (ref 7.5–12.5)
Platelets: 168 10*3/uL (ref 140–400)
RBC: 4.43 10*6/uL (ref 3.80–5.10)
RDW: 13.9 % (ref 11.0–15.0)
WBC: 5.2 10*3/uL (ref 3.8–10.8)

## 2018-11-25 LAB — LIPID PANEL
Cholesterol: 132 mg/dL (ref <200)
HDL: 45 mg/dL — ABNORMAL LOW (ref >=50)
LDL Cholesterol (Calc): 69 mg/dL (calc)
Non-HDL Cholesterol (Calc): 87 mg/dL (calc) (ref <130)
Total CHOL/HDL Ratio: 2.9 (calc) (ref <5.0)
Triglycerides: 95 mg/dL (ref <150)

## 2018-11-25 LAB — HEMOGLOBIN A1C
Hgb A1c MFr Bld: 5.4 % of total Hgb (ref <5.7)
Mean Plasma Glucose: 108 (calc)
eAG (mmol/L): 6 (calc)

## 2018-11-25 LAB — TSH: TSH: 0.92 mIU/L (ref 0.40–4.50)

## 2019-05-02 ENCOUNTER — Other Ambulatory Visit: Payer: Self-pay | Admitting: Osteopathic Medicine

## 2019-05-02 DIAGNOSIS — I509 Heart failure, unspecified: Secondary | ICD-10-CM

## 2019-05-10 ENCOUNTER — Encounter: Payer: Self-pay | Admitting: Osteopathic Medicine

## 2019-05-10 ENCOUNTER — Other Ambulatory Visit: Payer: Self-pay

## 2019-05-10 ENCOUNTER — Ambulatory Visit (INDEPENDENT_AMBULATORY_CARE_PROVIDER_SITE_OTHER): Payer: Medicare Other | Admitting: Osteopathic Medicine

## 2019-05-10 VITALS — BP 145/86 | HR 80 | Temp 98.0°F | Wt 153.0 lb

## 2019-05-10 DIAGNOSIS — I509 Heart failure, unspecified: Secondary | ICD-10-CM

## 2019-05-10 DIAGNOSIS — R7303 Prediabetes: Secondary | ICD-10-CM | POA: Diagnosis not present

## 2019-05-10 DIAGNOSIS — Z23 Encounter for immunization: Secondary | ICD-10-CM

## 2019-05-10 DIAGNOSIS — I10 Essential (primary) hypertension: Secondary | ICD-10-CM | POA: Diagnosis not present

## 2019-05-10 NOTE — Progress Notes (Signed)
HPI: Angelica Mcbride is a 78 y.o. female who  has a past medical history of CHF (congestive heart failure) ([place]) (07/2013), Chronic congestive heart failure ([place]) (08/14/2015), Chronic obstructive pulmonary disease ([place]) (08/14/2015), COPD (chronic obstructive pulmonary disease) ([place]), Essential hypertension (08/14/2015), HTN (hypertension), malignant, Prediabetes (08/14/2015), Respiratory failure ([place]), and Shortness of breath.  she presents to [facility] today, 05/10/19,  for chief complaint of: medical follow up  Patient has no new concerns today. BP at home has been in the 130s/80s. Takes amlodipine 5 mg and losartan 50 regularly in the AM.    BP Readings from Last 3 Encounters:  05/10/19 (!) 145/86  11/23/18 (!) 155/96  06/23/18 102/62    Patient is accompanied by daughter, Leretha Dykes, who assists with history-taking.    Past medical, surgical, social and family history reviewed and updated as necessary.   Current medication list and allergy/intolerance information reviewed:    Current Outpatient Medications  Medication Sig Dispense Refill  . acetaminophen (TYLENOL) 500 MG tablet Take 2 tablets (1,000 mg total) by mouth every 6 (six) hours as needed for mild pain or moderate pain. 30 tablet   . albuterol (PROVENTIL HFA;VENTOLIN HFA) 108 (90 Base) MCG/ACT inhaler Inhale 2 puffs into the lungs every 4 (four) hours as needed for wheezing or shortness of breath. 1 Inhaler 3  . amLODipine (NORVASC) 5 MG tablet Take 1 tablet (5 mg total) by mouth daily. 90 tablet 3  . aspirin 81 MG tablet Take 1 tablet (81 mg total) by mouth daily. 90 tablet 1  . furosemide (LASIX) 40 MG tablet Take 1 tablet (40 mg total) by mouth daily. 90 tablet 3  . losartan (COZAAR) 50 MG tablet TAKE 1 TABLET DAILY 90 tablet 1  . pravastatin (PRAVACHOL) 40 MG tablet Take 1 tablet (40 mg total) by mouth daily. 90 tablet 3   No current facility-administered medications for this visit.      No Known Allergies    Review of Systems:  Constitutional:  No  fever, no chills, No recent illness  HEENT: No  headache  Cardiac: No  chest pain, No  pressure, No palpitations  Respiratory:  No  shortness of breath. No  Cough  Neurologic: No  weakness, No  dizziness  Exam:  BP (!) 145/86 (BP Location: Left Arm, Patient Position: Sitting, Cuff Size: Normal)   Pulse 80   Temp 98 F (36.7 C) (Oral)   Wt 153 lb 0.6 oz (69.4 kg)   BMI 23.97 kg/m   Constitutional: VS see above. General Appearance: alert, well-developed, well-nourished, NAD  Neck: No masses, trachea midline. No tenderness/mass appreciated. No lymphadenopathy  Respiratory: Normal respiratory effort. no wheeze, no rhonchi, no rales  Cardiovascular: S1/S2 normal, no murmur, no rub/gallop auscultated. RRR. No lower extremity edema.   Gastrointestinal: Nontender, no masses. Bowel sounds normal.  Musculoskeletal: Gait normal.   Neurological: Normal balance/coordination. No tremor.   Skin: warm, dry, intact.   Psychiatric: Normal judgment/insight. Normal mood and affect.   No results found for this or any previous visit (from the past 72 hour(s)).  No results found.   ASSESSMENT/PLAN: The primary encounter diagnosis was Essential hypertension. Diagnoses of Need for influenza vaccination, Chronic congestive heart failure, unspecified heart failure type ([place]), and Prediabetes were also pertinent to this visit.   Patient UTD on vaccines. Received flu shot in office. Order annual labs and A1C check today. Plan to follow up in 6 months.   No orders of the defined types  were placed in this encounter.   Orders Placed This Encounter  Procedures  . Flu Vaccine QUAD High Dose(Fluad)  . CBC  . COMPLETE METABOLIC PANEL WITH GFR  . Lipid panel  . Hemoglobin A1c        Visit summary with medication list and pertinent instructions was printed for patient to review. All questions at time of visit were  answered - patient instructed to contact office with any additional concerns or updates. ER/RTC precautions were reviewed with the patient.   Follow-up plan: Return in about 6 months (around 11/08/2019) for routine check-up w/ Dr A, see me sooner if needed! .    Please note: voice recognition software was used to produce this document, and typos may escape review. Please contact Dr. Lyn Hollingshead for any needed clarifications.

## 2019-05-11 LAB — COMPLETE METABOLIC PANEL WITH GFR
AG Ratio: 1.4 (calc) (ref 1.0–2.5)
ALT: 9 U/L (ref 6–29)
AST: 18 U/L (ref 10–35)
Albumin: 4.2 g/dL (ref 3.6–5.1)
Alkaline phosphatase (APISO): 58 U/L (ref 37–153)
BUN: 13 mg/dL (ref 7–25)
CO2: 31 mmol/L (ref 20–32)
Calcium: 9 mg/dL (ref 8.6–10.4)
Chloride: 105 mmol/L (ref 98–110)
Creat: 0.92 mg/dL (ref 0.60–0.93)
GFR, Est African American: 70 mL/min/{1.73_m2} (ref >=60)
GFR, Est Non African American: 60 mL/min/{1.73_m2} (ref >=60)
Globulin: 2.9 g/dL (calc) (ref 1.9–3.7)
Glucose, Bld: 103 mg/dL — ABNORMAL HIGH (ref 65–99)
Potassium: 4.2 mmol/L (ref 3.5–5.3)
Sodium: 144 mmol/L (ref 135–146)
Total Bilirubin: 0.4 mg/dL (ref 0.2–1.2)
Total Protein: 7.1 g/dL (ref 6.1–8.1)

## 2019-05-11 LAB — CBC
HCT: 44.1 % (ref 35.0–45.0)
Hemoglobin: 14.7 g/dL (ref 11.7–15.5)
MCH: 30 pg (ref 27.0–33.0)
MCHC: 33.3 g/dL (ref 32.0–36.0)
MCV: 90 fL (ref 80.0–100.0)
MPV: 12.2 fL (ref 7.5–12.5)
Platelets: 161 10*3/uL (ref 140–400)
RBC: 4.9 10*6/uL (ref 3.80–5.10)
RDW: 16.2 % — ABNORMAL HIGH (ref 11.0–15.0)
WBC: 5.5 10*3/uL (ref 3.8–10.8)

## 2019-05-11 LAB — LIPID PANEL
Cholesterol: 145 mg/dL (ref <200)
HDL: 47 mg/dL — ABNORMAL LOW (ref >=50)
LDL Cholesterol (Calc): 84 mg/dL (calc)
Non-HDL Cholesterol (Calc): 98 mg/dL (calc) (ref <130)
Total CHOL/HDL Ratio: 3.1 (calc) (ref <5.0)
Triglycerides: 64 mg/dL (ref <150)

## 2019-05-11 LAB — HEMOGLOBIN A1C
Hgb A1c MFr Bld: 5.6 % of total Hgb (ref <5.7)
Mean Plasma Glucose: 114 (calc)
eAG (mmol/L): 6.3 (calc)

## 2019-06-07 ENCOUNTER — Other Ambulatory Visit: Payer: Self-pay

## 2019-06-23 NOTE — Progress Notes (Signed)
Subjective:   Angelica Mcbride is a 78 y.o. female who presents for Medicare Annual (Subsequent) preventive examination.  Review of Systems:  No ROS.  Medicare Wellness Virtual Visit.  Visual/audio telehealth visit, UTA vital signs.   See social history for additional risk factors.    Cardiac Risk Factors include: advanced age (>47men, >49 women);hypertension;smoking/ tobacco exposure;sedentary lifestyle Sleep patterns: Getting 6 hours of sleep a night. Does not wake up to void during the night.Wakes up and feels rested but there are times that she feels tired   Home Safety/Smoke Alarms: Feels safe in home. Smoke alarms in place.  Living environment; Lives with daughter in a 1 story home. No stairs in or around the home. Shower is a step over tub combo with grab bars in place as well as a chair seat in there as well. Seat Belt Safety/Bike Helmet: Wears seat belt.   Female:   Pap- Aged out      Mammo- Aged out       Dexa scan- UTD       CCS- Aged out     Objective:     Vitals: There were no vitals taken for this visit.  There is no height or weight on file to calculate BMI.  Advanced Directives 06/29/2019 06/23/2018 06/02/2015 11/25/2014 06/21/2014 07/29/2013  Does Patient Have a Medical Advance Directive? No No No Yes No Patient does not have advance directive  Type of Advance Directive - - - Healthcare Power of Attorney - -  Copy of Healthcare Power of Attorney in Chart? - - - No - copy requested - -  Would patient like information on creating a medical advance directive? No - Patient declined Yes (MAU/Ambulatory/Procedural Areas - Information given) Yes - Educational materials given - No - patient declined information -    Tobacco Social History   Tobacco Use  Smoking Status Current Every Day Smoker  . Packs/day: 1.00  . Years: 51.00  . Pack years: 51.00  . Types: Cigarettes  . Start date: 07/15/1961  Smokeless Tobacco Never Used     Ready to quit: No Counseling given:  Not Answered   Clinical Intake:  Pre-visit preparation completed: Yes  Pain : No/denies pain     Nutritional Risks: None Diabetes: No  How often do you need to have someone help you when you read instructions, pamphlets, or other written materials from your doctor or pharmacy?: 1 - Never What is the last grade level you completed in school?: 14  Interpreter Needed?: No  Information entered by :: Carolin Sicks, LPN  Past Medical History:  Diagnosis Date  . CHF (congestive heart failure) (HCC) 07/2013   ACUTE  . Chronic congestive heart failure (HCC) 08/14/2015  . Chronic obstructive pulmonary disease (HCC) 08/14/2015  . COPD (chronic obstructive pulmonary disease) (HCC)   . Essential hypertension 08/14/2015  . HTN (hypertension), malignant    HX OF  . Prediabetes 08/14/2015  . Respiratory failure (HCC)   . Shortness of breath    History reviewed. No pertinent surgical history. Family History  Problem Relation Age of Onset  . Diabetes Daughter   . Hypertension Father   . Stroke Brother   . Heart attack Mother    Social History   Socioeconomic History  . Marital status: Single    Spouse name: Not on file  . Number of children: 1  . Years of education: 5  . Highest education level: Associate degree: academic program  Occupational History  . Occupation: retired  Comment: Corporate investment bankerComputers Operator  Tobacco Use  . Smoking status: Current Every Day Smoker    Packs/day: 1.00    Years: 51.00    Pack years: 51.00    Types: Cigarettes    Start date: 07/15/1961  . Smokeless tobacco: Never Used  Substance and Sexual Activity  . Alcohol use: No    Alcohol/week: 0.0 standard drinks  . Drug use: No  . Sexual activity: Not Currently  Other Topics Concern  . Not on file  Social History Narrative   Stays at home mostly. Lives with daughter. Sleeps when her daughter is at home. Does do house work. Drinks coffee in the mornings.    Works Corporate treasurerpuzzles alot   Social Determinants of  Corporate investment bankerHealth   Financial Resource Strain:   . Difficulty of Paying Living Expenses: Not on file  Food Insecurity:   . Worried About Programme researcher, broadcasting/film/videounning Out of Food in the Last Year: Not on file  . Ran Out of Food in the Last Year: Not on file  Transportation Needs:   . Lack of Transportation (Medical): Not on file  . Lack of Transportation (Non-Medical): Not on file  Physical Activity:   . Days of Exercise per Week: Not on file  . Minutes of Exercise per Session: Not on file  Stress:   . Feeling of Stress : Not on file  Social Connections:   . Frequency of Communication with Friends and Family: Not on file  . Frequency of Social Gatherings with Friends and Family: Not on file  . Attends Religious Services: Not on file  . Active Member of Clubs or Organizations: Not on file  . Attends BankerClub or Organization Meetings: Not on file  . Marital Status: Not on file    Outpatient Encounter Medications as of 06/29/2019  Medication Sig  . acetaminophen (TYLENOL) 500 MG tablet Take 2 tablets (1,000 mg total) by mouth every 6 (six) hours as needed for mild pain or moderate pain.  Marland Kitchen. albuterol (PROVENTIL HFA;VENTOLIN HFA) 108 (90 Base) MCG/ACT inhaler Inhale 2 puffs into the lungs every 4 (four) hours as needed for wheezing or shortness of breath.  Marland Kitchen. amLODipine (NORVASC) 5 MG tablet Take 1 tablet (5 mg total) by mouth daily.  Marland Kitchen. aspirin 81 MG tablet Take 1 tablet (81 mg total) by mouth daily.  . furosemide (LASIX) 40 MG tablet Take 1 tablet (40 mg total) by mouth daily.  Marland Kitchen. losartan (COZAAR) 50 MG tablet TAKE 1 TABLET DAILY  . pravastatin (PRAVACHOL) 40 MG tablet Take 1 tablet (40 mg total) by mouth daily.   No facility-administered encounter medications on file as of 06/29/2019.    Activities of Daily Living In your present state of health, do you have any difficulty performing the following activities: 06/29/2019  Hearing? N  Vision? Y  Comment has cataract in right eye  Difficulty concentrating or making  decisions? N  Walking or climbing stairs? Y  Comment avoids stairs  Dressing or bathing? N  Doing errands, shopping? N  Preparing Food and eating ? N  Using the Toilet? N  In the past six months, have you accidently leaked urine? N  Do you have problems with loss of bowel control? N  Managing your Medications? N  Managing your Finances? N  Housekeeping or managing your Housekeeping? N  Some recent data might be hidden    Patient Care Team: Sunnie NielsenAlexander, Natalie, DO as PCP - General (Osteopathic Medicine) Berton BonMikell, Asiyah Zahra, MD (Inactive)    Assessment:   This  is a routine wellness examination for Janeal.Physical assessment deferred to PCP.   Exercise Activities and Dietary recommendations Current Exercise Habits: The patient does not participate in regular exercise at present, Exercise limited by: cardiac condition(s);respiratory conditions(s)(COPD and heart failure) Diet Eats a healthy diet of vegetables and proteins. Breakfast: coffee, cinnamon roll Lunch: eats out Dinner:  Meat mostly chicken and vegetables. Drinks a lot of soda daily ( pepsi) Advised to drink more water.     Goals    . Patient Stated     Patient stated would like to try and cut down on her smoking to less than a pack a day.       Fall Risk Fall Risk  06/29/2019 06/23/2018 03/04/2018 11/11/2016 06/02/2015  Falls in the past year? 0 0 No No No  Number falls in past yr: 0 - - - -  Injury with Fall? 0 - - - -  Risk for fall due to : Impaired balance/gait - - - -  Follow up Education provided - - - -   Is the patient's home free of loose throw rugs in walkways, pet beds, electrical cords, etc?   yes      Grab bars in the bathroom? yes      Handrails on the stairs?   no      Adequate lighting?   yes  Depression Screen PHQ 2/9 Scores 06/29/2019 06/23/2018 03/04/2018 11/10/2017  PHQ - 2 Score 0 0 0 0  PHQ- 9 Score - - - 0     Cognitive Function     6CIT Screen 06/29/2019 06/23/2018  What Year? 4  points 0 points  What month? 0 points 0 points  What time? 0 points 0 points  Count back from 20 0 points 0 points  Months in reverse 0 points 0 points  Repeat phrase 0 points 0 points  Total Score 4 0    Immunization History  Administered Date(s) Administered  . Fluad Quad(high Dose 65+) 05/10/2019  . Influenza, High Dose Seasonal PF 05/14/2016, 05/12/2017  . Influenza,inj,Quad PF,6+ Mos 07/25/2013, 06/21/2014, 06/02/2015, 03/04/2018  . Pneumococcal Conjugate-13 12/08/2015  . Pneumococcal Polysaccharide-23 07/25/2013  . Tdap 05/14/2016    Screening Tests Health Maintenance  Topic Date Due  . TETANUS/TDAP  05/14/2026  . INFLUENZA VACCINE  Completed  . DEXA SCAN  Completed  . PNA vac Low Risk Adult  Completed        Plan:    Please schedule your next medicare wellness visit with me in 1 yr.  Ms. Calzada , Thank you for taking time to come for your Medicare Wellness Visit. I appreciate your ongoing commitment to your health goals. Please review the following plan we discussed and let me know if I can assist you in the future.  Continue doing brain stimulating activities (puzzles, reading, adult coloring books, staying active) to keep memory sharp.     These are the goals we discussed: Goals    . Patient Stated     Patient stated would like to try and cut down on her smoking to less than a pack a day.       This is a list of the screening recommended for you and due dates:  Health Maintenance  Topic Date Due  . Tetanus Vaccine  05/14/2026  . Flu Shot  Completed  . DEXA scan (bone density measurement)  Completed  . Pneumonia vaccines  Completed      I have personally reviewed and noted the following  in the patient's chart:   . Medical and social history . Use of alcohol, tobacco or illicit drugs  . Current medications and supplements . Functional ability and status . Nutritional status . Physical activity . Advanced directives . List of other  physicians . Hospitalizations, surgeries, and ER visits in previous 12 months . Vitals . Screenings to include cognitive, depression, and falls . Referrals and appointments  In addition, I have reviewed and discussed with patient certain preventive protocols, quality metrics, and best practice recommendations. A written personalized care plan for preventive services as well as general preventive health recommendations were provided to patient.     Normand Sloop, LPN  81/19/1478

## 2019-06-29 ENCOUNTER — Ambulatory Visit (INDEPENDENT_AMBULATORY_CARE_PROVIDER_SITE_OTHER): Payer: Medicare Other | Admitting: *Deleted

## 2019-06-29 VITALS — BP 139/82 | Temp 96.7°F | Ht 67.5 in | Wt 153.0 lb

## 2019-06-29 DIAGNOSIS — Z0000 Encounter for general adult medical examination without abnormal findings: Secondary | ICD-10-CM

## 2019-06-29 NOTE — Patient Instructions (Addendum)
Please schedule your next medicare wellness visit with me in 1 yr.  Angelica Mcbride , Thank you for taking time to come for your Medicare Wellness Visit. I appreciate your ongoing commitment to your health goals. Please review the following plan we discussed and let me know if I can assist you in the future.  Continue doing brain stimulating activities (puzzles, reading, adult coloring books, staying active) to keep memory sharp.   These are the goals we discussed: Goals    . Patient Stated     Patient stated would like to try and cut down on her smoking to less than a pack a day.      Advised to increase water intake and cut back on the sodas. Instructed on how to do some chair exercises to help maintain muscle mass.

## 2019-10-12 ENCOUNTER — Other Ambulatory Visit: Payer: Self-pay | Admitting: Osteopathic Medicine

## 2019-10-12 DIAGNOSIS — I509 Heart failure, unspecified: Secondary | ICD-10-CM

## 2019-11-29 ENCOUNTER — Ambulatory Visit: Payer: Medicare Other | Admitting: Osteopathic Medicine

## 2019-12-10 DIAGNOSIS — Z23 Encounter for immunization: Secondary | ICD-10-CM | POA: Diagnosis not present

## 2020-02-04 ENCOUNTER — Other Ambulatory Visit: Payer: Self-pay

## 2020-02-04 ENCOUNTER — Encounter: Payer: Self-pay | Admitting: Osteopathic Medicine

## 2020-02-04 ENCOUNTER — Ambulatory Visit (INDEPENDENT_AMBULATORY_CARE_PROVIDER_SITE_OTHER): Payer: Medicare Other | Admitting: Osteopathic Medicine

## 2020-02-04 VITALS — BP 127/74 | HR 79 | Wt 145.0 lb

## 2020-02-04 DIAGNOSIS — R7303 Prediabetes: Secondary | ICD-10-CM | POA: Diagnosis not present

## 2020-02-04 DIAGNOSIS — I10 Essential (primary) hypertension: Secondary | ICD-10-CM

## 2020-02-04 DIAGNOSIS — J449 Chronic obstructive pulmonary disease, unspecified: Secondary | ICD-10-CM

## 2020-02-04 DIAGNOSIS — I509 Heart failure, unspecified: Secondary | ICD-10-CM

## 2020-02-04 DIAGNOSIS — E559 Vitamin D deficiency, unspecified: Secondary | ICD-10-CM | POA: Diagnosis not present

## 2020-02-04 LAB — CBC
HCT: 43.7 % (ref 35.0–45.0)
Hemoglobin: 14.6 g/dL (ref 11.7–15.5)
MCH: 30.9 pg (ref 27.0–33.0)
MCHC: 33.4 g/dL (ref 32.0–36.0)
MCV: 92.6 fL (ref 80.0–100.0)
MPV: 12.4 fL (ref 7.5–12.5)
Platelets: 168 10*3/uL (ref 140–400)
RBC: 4.72 10*6/uL (ref 3.80–5.10)
RDW: 14.9 % (ref 11.0–15.0)
WBC: 4.6 10*3/uL (ref 3.8–10.8)

## 2020-02-04 LAB — LIPID PANEL
Cholesterol: 132 mg/dL (ref <200)
HDL: 47 mg/dL — ABNORMAL LOW (ref >=50)
LDL Cholesterol (Calc): 70 mg/dL (calc)
Non-HDL Cholesterol (Calc): 85 mg/dL (calc) (ref <130)
Total CHOL/HDL Ratio: 2.8 (calc) (ref <5.0)
Triglycerides: 71 mg/dL (ref <150)

## 2020-02-04 LAB — COMPLETE METABOLIC PANEL WITH GFR
AG Ratio: 1.5 (calc) (ref 1.0–2.5)
ALT: 8 U/L (ref 6–29)
AST: 16 U/L (ref 10–35)
Albumin: 4.1 g/dL (ref 3.6–5.1)
Alkaline phosphatase (APISO): 63 U/L (ref 37–153)
BUN/Creatinine Ratio: 11 (calc) (ref 6–22)
BUN: 11 mg/dL (ref 7–25)
CO2: 33 mmol/L — ABNORMAL HIGH (ref 20–32)
Calcium: 9 mg/dL (ref 8.6–10.4)
Chloride: 105 mmol/L (ref 98–110)
Creat: 1.01 mg/dL — ABNORMAL HIGH (ref 0.60–0.93)
GFR, Est African American: 62 mL/min/{1.73_m2} (ref >=60)
GFR, Est Non African American: 54 mL/min/{1.73_m2} — ABNORMAL LOW (ref >=60)
Globulin: 2.8 g/dL (calc) (ref 1.9–3.7)
Glucose, Bld: 89 mg/dL (ref 65–139)
Potassium: 4.2 mmol/L (ref 3.5–5.3)
Sodium: 147 mmol/L — ABNORMAL HIGH (ref 135–146)
Total Bilirubin: 0.4 mg/dL (ref 0.2–1.2)
Total Protein: 6.9 g/dL (ref 6.1–8.1)

## 2020-02-04 LAB — VITAMIN D 25 HYDROXY (VIT D DEFICIENCY, FRACTURES): Vit D, 25-Hydroxy: 42 ng/mL (ref 30–100)

## 2020-02-04 MED ORDER — AMLODIPINE BESYLATE 5 MG PO TABS: 5.0000 mg | ORAL_TABLET | Freq: Every day | ORAL | 3 refills | Status: DC

## 2020-02-04 MED ORDER — PRAVASTATIN SODIUM 40 MG PO TABS: 40.0000 mg | ORAL_TABLET | Freq: Every day | ORAL | 3 refills | Status: DC

## 2020-02-04 MED ORDER — FUROSEMIDE 40 MG PO TABS: 40.0000 mg | ORAL_TABLET | Freq: Every day | ORAL | 3 refills | Status: DC

## 2020-02-04 MED ORDER — ALBUTEROL SULFATE HFA 108 (90 BASE) MCG/ACT IN AERS: 2.0000 | INHALATION_SPRAY | RESPIRATORY_TRACT | 99 refills | Status: DC | PRN

## 2020-02-04 MED ORDER — LOSARTAN POTASSIUM 50 MG PO TABS: 50.0000 mg | ORAL_TABLET | Freq: Every day | ORAL | 3 refills | Status: DC

## 2020-02-04 NOTE — Progress Notes (Signed)
Angelica Mcbride is a 79 y.o. female who presents to  [facility] at [facility]  today, 02/04/20, seeking care for the following:   Routine check-up - see below   No complaints today, doing welll     ASSESSMENT & PLAN with other pertinent findings:  The primary encounter diagnosis was Essential hypertension. Diagnoses of Chronic congestive heart failure (HCC), Prediabetes, Vitamin D deficiency, and Chronic obstructive pulmonary disease, unspecified COPD type (HCC) were also pertinent to this visit.  There are no Patient Instructions on file for this visit.  Orders Placed This Encounter  Procedures   CBC   COMPLETE METABOLIC PANEL WITH GFR   Lipid panel   VITAMIN D 25 Hydroxy (Vit-D Deficiency, Fractures)    Meds ordered this encounter  Medications   pravastatin (PRAVACHOL) 40 MG tablet    Sig: Take 1 tablet (40 mg total) by mouth daily.    Dispense:  90 tablet    Refill:  3   losartan (COZAAR) 50 MG tablet    Sig: Take 1 tablet (50 mg total) by mouth daily.    Dispense:  90 tablet    Refill:  3   furosemide (LASIX) 40 MG tablet    Sig: Take 1 tablet (40 mg total) by mouth daily.    Dispense:  90 tablet    Refill:  3   amLODipine (NORVASC) 5 MG tablet    Sig: Take 1 tablet (5 mg total) by mouth daily.    Dispense:  90 tablet    Refill:  3   albuterol (VENTOLIN HFA) 108 (90 Base) MCG/ACT inhaler    Sig: Inhale 2 puffs into the lungs every 4 (four) hours as needed for wheezing or shortness of breath.    Dispense:  18 g    Refill:  99         Follow-up instructions: Return in about 6 months (around 08/06/2020) for [employer] w/ DR A IN 1 YEAR, AND MEDICARE VISIT IN 6 MOS W/ DR A .                                         BP 127/74 (BP Location: Left Arm, Patient Position: Sitting)    Pulse 79    Wt 145 lb (65.8 kg)    SpO2 95%    BMI 22.38 kg/m   Current Meds   Medication Sig   acetaminophen (TYLENOL) 500 MG tablet Take 2 tablets (1,000 mg total) by mouth every 6 (six) hours as needed for mild pain or moderate pain.   albuterol (VENTOLIN HFA) 108 (90 Base) MCG/ACT inhaler Inhale 2 puffs into the lungs every 4 (four) hours as needed for wheezing or shortness of breath.   amLODipine (NORVASC) 5 MG tablet Take 1 tablet (5 mg total) by mouth daily.   aspirin 81 MG tablet Take 1 tablet (81 mg total) by mouth daily.   furosemide (LASIX) 40 MG tablet Take 1 tablet (40 mg total) by mouth daily.   losartan (COZAAR) 50 MG tablet Take 1 tablet (50 mg total) by mouth daily.   pravastatin (PRAVACHOL) 40 MG tablet Take 1 tablet (40 mg total) by mouth daily.   [DISCONTINUED] albuterol (PROVENTIL HFA;VENTOLIN HFA) 108 (90 Base) MCG/ACT inhaler Inhale 2 puffs into the lungs every 4 (four) hours as needed for wheezing or shortness of breath.   [DISCONTINUED] amLODipine (  NORVASC) 5 MG tablet Take 1 tablet (5 mg total) by mouth daily. NEEDS APPT   [DISCONTINUED] furosemide (LASIX) 40 MG tablet Take 1 tablet (40 mg total) by mouth daily. NEEDS APPT   [DISCONTINUED] losartan (COZAAR) 50 MG tablet Take 1 tablet (50 mg total) by mouth daily. NEEDS APPT   [DISCONTINUED] pravastatin (PRAVACHOL) 40 MG tablet Take 1 tablet (40 mg total) by mouth daily. NEEDS APPT    No results found for this or any previous visit (from the past 72 hour(s)).  No results found.     All questions at time of visit were answered - patient instructed to contact office with any additional concerns or updates.  ER/RTC precautions were reviewed with the patient as applicable.   Please note: voice recognition software was used to produce this document, and typos may escape review. Please contact Dr. Lyn Hollingshead for any needed clarifications.   Total encounter time: 30 minutes.

## 2020-02-07 ENCOUNTER — Encounter: Payer: Self-pay | Admitting: Osteopathic Medicine

## 2020-05-01 ENCOUNTER — Other Ambulatory Visit: Payer: Self-pay

## 2020-05-01 MED ORDER — AMLODIPINE BESYLATE 5 MG PO TABS: 5.0000 mg | ORAL_TABLET | Freq: Every day | ORAL | 3 refills | Status: DC

## 2020-05-01 MED ORDER — PRAVASTATIN SODIUM 40 MG PO TABS: 40.0000 mg | ORAL_TABLET | Freq: Every day | ORAL | 3 refills | Status: DC

## 2020-05-01 MED ORDER — FUROSEMIDE 40 MG PO TABS: 40.0000 mg | ORAL_TABLET | Freq: Every day | ORAL | 3 refills | Status: DC

## 2020-05-01 MED ORDER — LOSARTAN POTASSIUM 50 MG PO TABS: 50.0000 mg | ORAL_TABLET | Freq: Every day | ORAL | 3 refills | Status: DC

## 2020-07-05 DIAGNOSIS — Z23 Encounter for immunization: Secondary | ICD-10-CM | POA: Diagnosis not present

## 2020-08-08 ENCOUNTER — Encounter: Payer: Self-pay | Admitting: Osteopathic Medicine

## 2020-08-08 ENCOUNTER — Ambulatory Visit (INDEPENDENT_AMBULATORY_CARE_PROVIDER_SITE_OTHER): Payer: Medicare Other | Admitting: Osteopathic Medicine

## 2020-08-08 VITALS — BP 144/88 | Wt 154.8 lb

## 2020-08-08 DIAGNOSIS — E559 Vitamin D deficiency, unspecified: Secondary | ICD-10-CM

## 2020-08-08 DIAGNOSIS — I10 Essential (primary) hypertension: Secondary | ICD-10-CM

## 2020-08-08 DIAGNOSIS — M810 Age-related osteoporosis without current pathological fracture: Secondary | ICD-10-CM | POA: Diagnosis not present

## 2020-08-08 DIAGNOSIS — J449 Chronic obstructive pulmonary disease, unspecified: Secondary | ICD-10-CM | POA: Diagnosis not present

## 2020-08-08 DIAGNOSIS — I509 Heart failure, unspecified: Secondary | ICD-10-CM | POA: Diagnosis not present

## 2020-08-08 DIAGNOSIS — R7303 Prediabetes: Secondary | ICD-10-CM | POA: Diagnosis not present

## 2020-08-08 NOTE — Progress Notes (Signed)
Telemedicine Visit via  Audio only - telephone (patient preference /  technical difficulty with MyChart video application)  I connected with Angelica Mcbride on 08/08/20 at 10:07 AM  by phone or  telemedicine application as noted above  I verified that I am speaking with or regarding  the correct patient using two identifiers.  Participants: Myself, Dr Sunnie Nielsen DO Patient: Angelica Mcbride Patient proxy if applicable: none Other, if applicable: none  Patient is at home I am in office at [facility]    I discussed the limitations of evaluation and management  by telemedicine and the availability of in person appointments.  The participant(s) above expressed understanding and  agreed to proceed with this appointment via telemedicine.       History of Present Illness: Angelica Mcbride is a 80 y.o. female who would like to discuss routine follow-up chronic conditions and update health maintenance    Square blue pills - pravastatin - seems a little smaller? Advised her to d/w pharmacy, ensure she has correct dose vs manufacturer change or other reason for pill appearance   Last week w/ the cold weather she noticed feeling more sluggish but no SOB, LE edema, CP   Last labs reviewed - no concerns 6 mos ago   Observations/Objective: BP (!) 144/88   Wt 154 lb 12.8 oz (70.2 kg)   BMI 23.89 kg/m  BP Readings from Last 3 Encounters:  08/08/20 (!) 144/88  02/04/20 127/74  06/29/19 139/82   Exam: Normal Speech.  NAD  Lab and Radiology Results No results found for this or any previous visit (from the past 72 hour(s)). No results found.     Assessment and Plan: 80 y.o. female with The primary encounter diagnosis was Essential hypertension. Diagnoses of Chronic congestive heart failure, unspecified heart failure type (HCC), Prediabetes, Chronic obstructive pulmonary disease, unspecified COPD type (HCC), Osteoporosis without current pathological fracture,  unspecified osteoporosis type, and Vitamin D deficiency were also pertinent to this visit.  Stable chronic conditions above including HTN, COPD, CHF Would consider repeat DEXA scan, last (+)OP    PDMP not reviewed this encounter. No orders of the defined types were placed in this encounter.  No orders of the defined types were placed in this encounter.  There are no Patient Instructions on file for this visit.  Instructions sent via MyChart.   Follow Up Instructions: Return for KEEP CURRENTLY SCHEDULED APPOINTMENT. 6 MOS IN-OFFICE W/ LABS WITH DR A .    I discussed the assessment and treatment plan with the patient. The patient was provided an opportunity to ask questions and all were answered. The patient agreed with the plan and demonstrated an understanding of the instructions.   The patient was advised to call back or seek an in-person evaluation if any new concerns, if symptoms worsen or if the condition fails to improve as anticipated.  21 minutes of non-face-to-face time was provided during this encounter.      . . . . . . . . . . . . . Marland Kitchen                   Historical information moved to improve visibility of documentation.  Past Medical History:  Diagnosis Date  . CHF (congestive heart failure) (HCC) 07/2013   ACUTE  . Chronic congestive heart failure (HCC) 08/14/2015  . Chronic obstructive pulmonary disease (HCC) 08/14/2015  . COPD (chronic obstructive pulmonary disease) (HCC)   . Essential hypertension 08/14/2015  . HTN (hypertension), malignant  HX OF  . Prediabetes 08/14/2015  . Respiratory failure (HCC)   . Shortness of breath    No past surgical history on file. Social History   Tobacco Use  . Smoking status: Current Every Day Smoker    Packs/day: 1.00    Years: 51.00    Pack years: 51.00    Types: Cigarettes    Start date: 07/15/1961  . Smokeless tobacco: Never Used  Substance Use Topics  . Alcohol use: No     Alcohol/week: 0.0 standard drinks   family history includes Diabetes in her daughter; Heart attack in her mother; Hypertension in her father; Stroke in her brother.  Medications: Current Outpatient Medications  Medication Sig Dispense Refill  . acetaminophen (TYLENOL) 500 MG tablet Take 2 tablets (1,000 mg total) by mouth every 6 (six) hours as needed for mild pain or moderate pain. 30 tablet   . albuterol (VENTOLIN HFA) 108 (90 Base) MCG/ACT inhaler Inhale 2 puffs into the lungs every 4 (four) hours as needed for wheezing or shortness of breath. 18 g 99  . amLODipine (NORVASC) 5 MG tablet Take 1 tablet (5 mg total) by mouth daily. 90 tablet 3  . aspirin 81 MG tablet Take 1 tablet (81 mg total) by mouth daily. 90 tablet 1  . furosemide (LASIX) 40 MG tablet Take 1 tablet (40 mg total) by mouth daily. 90 tablet 3  . losartan (COZAAR) 50 MG tablet Take 1 tablet (50 mg total) by mouth daily. 90 tablet 3  . pravastatin (PRAVACHOL) 40 MG tablet Take 1 tablet (40 mg total) by mouth daily. 90 tablet 3   No current facility-administered medications for this visit.   No Known Allergies   If phone visit, billing and coding can please add appropriate modifier if needed

## 2020-10-02 ENCOUNTER — Encounter: Payer: Self-pay | Admitting: Osteopathic Medicine

## 2020-10-20 ENCOUNTER — Other Ambulatory Visit: Payer: Self-pay

## 2020-10-20 ENCOUNTER — Encounter: Payer: Self-pay | Admitting: Osteopathic Medicine

## 2020-10-20 ENCOUNTER — Ambulatory Visit (INDEPENDENT_AMBULATORY_CARE_PROVIDER_SITE_OTHER): Payer: Medicare Other | Admitting: Osteopathic Medicine

## 2020-10-20 VITALS — BP 132/80 | HR 100 | Temp 97.7°F | Wt 150.1 lb

## 2020-10-20 DIAGNOSIS — R7303 Prediabetes: Secondary | ICD-10-CM

## 2020-10-20 DIAGNOSIS — M810 Age-related osteoporosis without current pathological fracture: Secondary | ICD-10-CM | POA: Diagnosis not present

## 2020-10-20 DIAGNOSIS — I5032 Chronic diastolic (congestive) heart failure: Secondary | ICD-10-CM | POA: Diagnosis not present

## 2020-10-20 DIAGNOSIS — J449 Chronic obstructive pulmonary disease, unspecified: Secondary | ICD-10-CM | POA: Diagnosis not present

## 2020-10-20 DIAGNOSIS — I10 Essential (primary) hypertension: Secondary | ICD-10-CM

## 2020-10-20 DIAGNOSIS — E559 Vitamin D deficiency, unspecified: Secondary | ICD-10-CM | POA: Diagnosis not present

## 2020-10-20 NOTE — Progress Notes (Signed)
Angelica Mcbride is a 80 y.o. female who presents to  [facility] at [facility]  today, 10/20/20, seeking care for the following:  Marland Kitchen Monitor HTN . Due to q6 mos labs . Smoked a bit this morning since nothing to eat, wheezing on exam, pt denies SOB     ASSESSMENT & PLAN with other pertinent findings:  The primary encounter diagnosis was Essential hypertension. Diagnoses of Prediabetes, Chronic obstructive pulmonary disease, unspecified COPD type ([place]), Osteoporosis without current pathological fracture, unspecified osteoporosis type, Vitamin D deficiency, and Chronic diastolic congestive heart failure ([place]) were also pertinent to this visit.    There are no Patient Instructions on file for this visit.  Orders Placed This Encounter  Procedures  . CBC  . CMP14+EGFR  . Lipid panel  . Hemoglobin A1c  . VITAMIN D 25 Hydroxy (Vit-D Deficiency, Fractures)    No orders of the defined types were placed in this encounter.    See below for relevant physical exam findings  See below for recent lab and imaging results reviewed  Medications, allergies, PMH, PSH, SocH, FamH reviewed below    Follow-up instructions: Return for MEDICARE WELLNESS W/ RN IN 6 MOS (VIRTUAL/PHONE OK), VISIT W/ DR A FEW WEEKS AFTER THAN .                                        Exam:  BP 132/80 (BP Location: Left Arm, Patient Position: Sitting, Cuff Size: Normal)   Pulse 100   Temp 97.7 F (36.5 C) (Oral)   Wt 150 lb 1.3 oz (68.1 kg)   BMI 23.16 kg/m   Constitutional: VS see above. General Appearance: alert, well-developed, well-nourished, NAD  Neck: No masses, trachea midline.   Respiratory: Normal respiratory effort. Faint bilateral wheeze, no rhonchi, no rales  Cardiovascular: S1/S2 normal, 1/6 systolic murmur, no rub/gallop auscultated. RRR.   Musculoskeletal: Gait normal. Symmetric and independent movement of all  extremities  Abdominal: non-tender, non-distended, no appreciable organomegaly, neg Murphy's, BS WNLx4  Neurological: Normal balance/coordination. No tremor.  Skin: warm, dry, intact.   Psychiatric: Normal judgment/insight. Normal mood and affect. Oriented x3.   Current Meds  Medication Sig  . acetaminophen (TYLENOL) 500 MG tablet Take 2 tablets (1,000 mg total) by mouth every 6 (six) hours as needed for mild pain or moderate pain.  Marland Kitchen albuterol (VENTOLIN HFA) 108 (90 Base) MCG/ACT inhaler Inhale 2 puffs into the lungs every 4 (four) hours as needed for wheezing or shortness of breath.  Marland Kitchen amLODipine (NORVASC) 5 MG tablet Take 1 tablet (5 mg total) by mouth daily.  Marland Kitchen aspirin 81 MG tablet Take 1 tablet (81 mg total) by mouth daily.  . furosemide (LASIX) 40 MG tablet Take 1 tablet (40 mg total) by mouth daily.  Marland Kitchen losartan (COZAAR) 50 MG tablet Take 1 tablet (50 mg total) by mouth daily.  . pravastatin (PRAVACHOL) 40 MG tablet Take 1 tablet (40 mg total) by mouth daily.    No Known Allergies  Patient Active Problem List   Diagnosis Date Noted  . Osteoporosis of femur without pathological fracture 11/19/2017  . Vitamin D deficiency 12/08/2015  . Prediabetes 06/02/2015  . Headache(784.0) 12/01/2013  . COPD (chronic obstructive pulmonary disease) ([place]) 09/22/2013  . 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 Chronic diastolic congestive heart failure ([place]) 08/26/2013  . Tobacco abuse 08/02/2013  . 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 HTN (hypertension) 07/19/2013    Family History  Problem Relation Age of Onset  . Diabetes Daughter   . Hypertension Father   . Stroke Brother   . Heart attack Mother     Social History   Tobacco Use  Smoking Status Current Every Day Smoker  . Packs/day: 1.00  . Years: 51.00  . Pack years: 51.00  . Types: Cigarettes  . Start date: 07/15/1961  Smokeless Tobacco Never Used    No past surgical history on file.  Immunization History   Administered Date(s) Administered  . Fluad Quad(high Dose 65+) 05/10/2019  . Influenza, High Dose Seasonal PF 05/14/2016, 05/12/2017  . Influenza,inj,Quad PF,6+ Mos 07/25/2013, 06/21/2014, 06/02/2015, 03/04/2018  . Influenza-Unspecified 07/05/2020  . Moderna Sars-Covid-2 Vaccination 12/07/2019, 01/07/2020, 07/17/2020  . Pneumococcal Conjugate-13 12/08/2015  . Pneumococcal Polysaccharide-23 07/25/2013  . Tdap 05/14/2016    No results found for this or any previous visit (from the past 2160 hour(s)).  No results found.     All questions at time of visit were answered - patient instructed to contact office with any additional concerns or updates. ER/RTC precautions were reviewed with the patient as applicable.   Please note: manual typing as well as voice recognition software may have been used to produce this document - typos may escape review. Please contact Dr. Sheppard Coil for any needed clarifications.

## 2020-10-21 LAB — CMP14+EGFR
ALT: 16 IU/L (ref 0–32)
AST: 26 IU/L (ref 0–40)
Albumin/Globulin Ratio: 1.5 (ref 1.2–2.2)
Albumin: 4.3 g/dL (ref 3.7–4.7)
Alkaline Phosphatase: 82 IU/L (ref 44–121)
BUN/Creatinine Ratio: 12 (ref 12–28)
BUN: 14 mg/dL (ref 8–27)
Bilirubin Total: 0.4 mg/dL (ref 0.0–1.2)
CO2: 28 mmol/L (ref 20–29)
Calcium: 9.4 mg/dL (ref 8.7–10.3)
Chloride: 100 mmol/L (ref 96–106)
Creatinine, Ser: 1.13 mg/dL — ABNORMAL HIGH (ref 0.57–1.00)
Globulin, Total: 2.8 g/dL (ref 1.5–4.5)
Glucose: 110 mg/dL — ABNORMAL HIGH (ref 65–99)
Potassium: 4.9 mmol/L (ref 3.5–5.2)
Sodium: 145 mmol/L — ABNORMAL HIGH (ref 134–144)
Total Protein: 7.1 g/dL (ref 6.0–8.5)
eGFR: 50 mL/min/{1.73_m2} — ABNORMAL LOW (ref >59)

## 2020-10-21 LAB — HEMOGLOBIN A1C
Est. average glucose Bld gHb Est-mCnc: 123 mg/dL
Hgb A1c MFr Bld: 5.9 % — ABNORMAL HIGH (ref 4.8–5.6)

## 2020-10-21 LAB — CBC
Hematocrit: 46.8 % — ABNORMAL HIGH (ref 34.0–46.6)
Hemoglobin: 15.5 g/dL (ref 11.1–15.9)
MCH: 28.4 pg (ref 26.6–33.0)
MCHC: 33.1 g/dL (ref 31.5–35.7)
MCV: 86 fL (ref 79–97)
Platelets: 179 10*3/uL (ref 150–450)
RBC: 5.46 x10E6/uL — ABNORMAL HIGH (ref 3.77–5.28)
RDW: 14.5 % (ref 11.7–15.4)
WBC: 6.6 10*3/uL (ref 3.4–10.8)

## 2020-10-21 LAB — LIPID PANEL
Chol/HDL Ratio: 3.4 ratio (ref 0.0–4.4)
Cholesterol, Total: 154 mg/dL (ref 100–199)
HDL: 45 mg/dL (ref >39)
LDL Chol Calc (NIH): 96 mg/dL (ref 0–99)
Triglycerides: 65 mg/dL (ref 0–149)
VLDL Cholesterol Cal: 13 mg/dL (ref 5–40)

## 2020-10-21 LAB — VITAMIN D 25 HYDROXY (VIT D DEFICIENCY, FRACTURES): Vit D, 25-Hydroxy: 68 ng/mL (ref 30.0–100.0)

## 2020-10-23 ENCOUNTER — Other Ambulatory Visit: Payer: Self-pay | Admitting: Osteopathic Medicine

## 2020-10-23 MED ORDER — FUROSEMIDE 40 MG PO TABS: 20.0000 mg | ORAL_TABLET | Freq: Every day | ORAL | 3 refills | Status: DC

## 2021-05-07 ENCOUNTER — Ambulatory Visit (INDEPENDENT_AMBULATORY_CARE_PROVIDER_SITE_OTHER): Payer: Medicare Other | Admitting: Medical-Surgical

## 2021-05-07 DIAGNOSIS — Z0000 Encounter for general adult medical examination without abnormal findings: Secondary | ICD-10-CM | POA: Diagnosis not present

## 2021-05-07 DIAGNOSIS — Z780 Asymptomatic menopausal state: Secondary | ICD-10-CM

## 2021-05-07 NOTE — Progress Notes (Signed)
MEDICARE ANNUAL WELLNESS VISIT  05/07/2021  Telephone Visit Disclaimer This Medicare AWV was conducted by telephone due to national recommendations for restrictions regarding the COVID-19 Pandemic (e.g. social distancing).  I verified, using two identifiers, that I am speaking with Faye Ramsay or their authorized healthcare agent. I discussed the limitations, risks, security, and privacy concerns of performing an evaluation and management service by telephone and the potential availability of an in-person appointment in the future. The patient expressed understanding and agreed to proceed.  Location of Patient: Home Location of Provider (nurse):  Provider home.   Subjective:    Chistina Jadwin is a 80 y.o. female patient of Sunnie Nielsen, DO who had a Medicare Annual Wellness Visit today via telephone. Elizibeth is Retired and lives with their daughter. she has 1 child. she reports that she is socially active and does interact with friends/family regularly. she is minimally physically active and enjoys doing crossword puzzles.  Patient Care Team: Sunnie Nielsen, DO as PCP - General (Osteopathic Medicine) Berton Bon, MD (Inactive)  Advanced Directives 05/07/2021 06/29/2019 06/23/2018 06/02/2015 11/25/2014 06/21/2014 07/29/2013  Does Patient Have a Medical Advance Directive? Yes No No No Yes No Patient does not have advance directive  Type of Advance Directive Living will;Healthcare Power of Attorney - - - Healthcare Power of Attorney - -  Does patient want to make changes to medical advance directive? No - Patient declined - - - - - -  Copy of Healthcare Power of Attorney in Chart? No - copy requested - - - No - copy requested - -  Would patient like information on creating a medical advance directive? - No - Patient declined Yes (MAU/Ambulatory/Procedural Areas - Information given) Yes - Educational materials given - No - patient declined information -    Hospital  Utilization Over the Past 12 Months: # of hospitalizations or ER visits: 0 # of surgeries: 0  Review of Systems    Patient reports that her overall health is unchanged compared to last year.  History obtained from chart review and the patient  Patient Reported Readings (BP, Pulse, CBG, Weight, etc) none  Pain Assessment Pain : No/denies pain     Current Medications & Allergies (verified) Allergies as of 05/07/2021   No Known Allergies      Medication List        Accurate as of May 07, 2021 11:13 AM. If you have any questions, ask your nurse or doctor.          acetaminophen 500 MG tablet Commonly known as: TYLENOL Take 2 tablets (1,000 mg total) by mouth every 6 (six) hours as needed for mild pain or moderate pain.   albuterol 108 (90 Base) MCG/ACT inhaler Commonly known as: VENTOLIN HFA Inhale 2 puffs into the lungs every 4 (four) hours as needed for wheezing or shortness of breath.   amLODipine 5 MG tablet Commonly known as: NORVASC Take 1 tablet (5 mg total) by mouth daily.   aspirin 81 MG tablet Take 1 tablet (81 mg total) by mouth daily.   furosemide 40 MG tablet Commonly known as: LASIX Take 0.5 tablets (20 mg total) by mouth daily.   losartan 50 MG tablet Commonly known as: COZAAR Take 1 tablet (50 mg total) by mouth daily.   pravastatin 40 MG tablet Commonly known as: PRAVACHOL Take 1 tablet (40 mg total) by mouth daily.        History (reviewed): Past Medical History:  Diagnosis Date   CHF (  congestive heart failure) (HCC) 07/2013   ACUTE   Chronic congestive heart failure (HCC) 08/14/2015   Chronic obstructive pulmonary disease (HCC) 08/14/2015   COPD (chronic obstructive pulmonary disease) (HCC)    Essential hypertension 08/14/2015   HTN (hypertension), malignant    HX OF   Prediabetes 08/14/2015   Respiratory failure (HCC)    Shortness of breath    History reviewed. No pertinent surgical history. Family History  Problem  Relation Age of Onset   Diabetes Daughter    Hypertension Father    Stroke Brother    Heart attack Mother    Social History   Socioeconomic History   Marital status: Single    Spouse name: Not on file   Number of children: 1   Years of education: 14   Highest education level: Associate degree: academic program  Occupational History   Occupation: retired    Comment: Corporate investment banker  Tobacco Use   Smoking status: Every Day    Packs/day: 1.00    Years: 51.00    Pack years: 51.00    Types: Cigarettes    Start date: 07/15/1961   Smokeless tobacco: Never  Vaping Use   Vaping Use: Never used  Substance and Sexual Activity   Alcohol use: No    Alcohol/week: 0.0 standard drinks   Drug use: No   Sexual activity: Not Currently  Other Topics Concern   Not on file  Social History Narrative   Stays at home mostly. Lives with daughter. Sleeps when her daughter is at home. Does do house work. Enjoys doing crossword puzzles.   Social Determinants of Health   Financial Resource Strain: Low Risk    Difficulty of Paying Living Expenses: Not hard at all  Food Insecurity: No Food Insecurity   Worried About Programme researcher, broadcasting/film/video in the Last Year: Never true   Ran Out of Food in the Last Year: Never true  Transportation Needs: No Transportation Needs   Lack of Transportation (Medical): No   Lack of Transportation (Non-Medical): No  Physical Activity: Inactive   Days of Exercise per Week: 0 days   Minutes of Exercise per Session: 0 min  Stress: No Stress Concern Present   Feeling of Stress : Not at all  Social Connections: Socially Isolated   Frequency of Communication with Friends and Family: More than three times a week   Frequency of Social Gatherings with Friends and Family: More than three times a week   Attends Religious Services: Never   Database administrator or Organizations: No   Attends Banker Meetings: Never   Marital Status: Never married    Activities of  Daily Living In your present state of health, do you have any difficulty performing the following activities: 05/07/2021  Hearing? N  Vision? Y  Comment She has cataract in one eye.  Difficulty concentrating or making decisions? N  Walking or climbing stairs? N  Dressing or bathing? N  Doing errands, shopping? Y  Comment She doesn't drive anymore. She does her errands with her daughter.  Preparing Food and eating ? N  Using the Toilet? N  In the past six months, have you accidently leaked urine? Y  Comment When she has a urgency, she mgiht leak a little while.  Do you have problems with loss of bowel control? N  Managing your Medications? N  Managing your Finances? N  Housekeeping or managing your Housekeeping? N  Some recent data might be hidden  Patient Education/ Literacy How often do you need to have someone help you when you read instructions, pamphlets, or other written materials from your doctor or pharmacy?: 1 - Never What is the last grade level you completed in school?: Some College  Exercise Current Exercise Habits: The patient does not participate in regular exercise at present, Exercise limited by: None identified  Diet Patient reports consuming 3 meals a day and 3 snack(s) a day Patient reports that her primary diet is: Regular Patient reports that she does have regular access to food.   Depression Screen PHQ 2/9 Scores 05/07/2021 06/29/2019 06/23/2018 03/04/2018 11/10/2017 05/12/2017 11/11/2016  PHQ - 2 Score 0 0 0 0 0 0 0  PHQ- 9 Score - - - - 0 0 -     Fall Risk Fall Risk  05/07/2021 06/29/2019 06/07/2019 06/23/2018 03/04/2018  Falls in the past year? 0 0 0 0 No  Comment - - Emmi Telephone Survey: data to providers prior to load - -  Number falls in past yr: 0 0 - - -  Injury with Fall? 0 0 - - -  Risk for fall due to : No Fall Risks Impaired balance/gait - - -  Follow up Falls evaluation completed;Education provided;Falls prevention discussed Education  provided - - -     Objective:  Bettina Warn seemed alert and oriented and she participated appropriately during our telephone visit.  Blood Pressure Weight BMI  BP Readings from Last 3 Encounters:  10/20/20 132/80  08/08/20 (!) 144/88  02/04/20 127/74   Wt Readings from Last 3 Encounters:  10/20/20 150 lb 1.3 oz (68.1 kg)  08/08/20 154 lb 12.8 oz (70.2 kg)  02/04/20 145 lb (65.8 kg)   BMI Readings from Last 1 Encounters:  10/20/20 23.16 kg/m    *Unable to obtain current vital signs, weight, and BMI due to telephone visit type  Hearing/Vision  Shar did not seem to have difficulty with hearing/understanding during the telephone conversation Reports that she has had a formal eye exam by an eye care professional within the past year Reports that she has not had a formal hearing evaluation within the past year *Unable to fully assess hearing and vision during telephone visit type  Cognitive Function: 6CIT Screen 05/07/2021 06/29/2019 06/23/2018  What Year? 0 points 4 points 0 points  What month? 0 points 0 points 0 points  What time? 0 points 0 points 0 points  Count back from 20 0 points 0 points 0 points  Months in reverse 0 points 0 points 0 points  Repeat phrase 0 points 0 points 0 points  Total Score 0 4 0   (Normal:0-7, Significant for Dysfunction: >8)  Normal Cognitive Function Screening: Yes   Immunization & Health Maintenance Record Immunization History  Administered Date(s) Administered   Fluad Quad(high Dose 65+) 05/10/2019   Influenza, High Dose Seasonal PF 05/14/2016, 05/12/2017   Influenza,inj,Quad PF,6+ Mos 07/25/2013, 06/21/2014, 06/02/2015, 03/04/2018   Influenza-Unspecified 07/05/2020   Moderna Sars-Covid-2 Vaccination 12/07/2019, 01/07/2020, 07/17/2020   Pneumococcal Conjugate-13 12/08/2015   Pneumococcal Polysaccharide-23 07/25/2013   Tdap 05/14/2016    Health Maintenance  Topic Date Due   COVID-19 Vaccine (4 - Booster for Moderna series)  05/23/2021 (Originally 09/11/2020)   Zoster Vaccines- Shingrix (1 of 2) 08/07/2021 (Originally 05/09/1992)   INFLUENZA VACCINE  10/12/2021 (Originally 02/12/2021)   Hepatitis C Screening  05/07/2022 (Originally 05/09/1959)   TETANUS/TDAP  05/14/2026   Pneumonia Vaccine 62+ Years old  Completed   DEXA SCAN  Completed  HPV VACCINES  Aged Out       Assessment  This is a routine wellness examination for [employer].  Health Maintenance: Due or Overdue There are no preventive care reminders to display for this patient.   Neriyah Cercone does not need a referral for Community Assistance: Care Management:   no Social Work:    no Prescription Assistance:  no Nutrition/Diabetes Education:  no   Plan:  Personalized Goals  Goals Addressed               This Visit's Progress     Patient Stated (pt-stated)        05/07/2021 AWV Goal: Exercise for General Health  Patient will verbalize understanding of the benefits of increased physical activity: Exercising regularly is important. It will improve your overall fitness, flexibility, and endurance. Regular exercise also will improve your overall health. It can help you control your weight, reduce stress, and improve your bone density. Over the next year, patient will increase physical activity as tolerated with a goal of at least 150 minutes of moderate physical activity per week.  You can tell that you are exercising at a moderate intensity if your heart starts beating faster and you start breathing faster but can still hold a conversation. Moderate-intensity exercise ideas include: Walking 1 mile (1.6 km) in about 15 minutes Biking Hiking Golfing Dancing Water aerobics Patient will verbalize understanding of everyday activities that increase physical activity by providing examples like the following: Yard work, such as: Barista Gardening Washing windows or floors Patient will be able to explain general safety guidelines for exercising:  Before you start a new exercise program, talk with your health care provider. Do not exercise so much that you hurt yourself, feel dizzy, or get very short of breath. Wear comfortable clothes and wear shoes with good support. Drink plenty of water while you exercise to prevent dehydration or heat stroke. Work out until your breathing and your heartbeat get faster.        Personalized Health Maintenance & Screening Recommendations  Influenza vaccine Bone densitometry screening Shingles vaccine Lung Cancer Screening  Lung Cancer Screening Recommended: yes; patient will let us know when she is ready.  (Low Dose CT Chest recommended if Age 82-80 years, 30 pack-year currently smoking OR have quit w/in past 15 years) Hepatitis C Screening recommended: yes HIV Screening recommended: no  Advanced Directives: Written information was not prepared per patient's request.  Referrals & Orders Orders Placed This Encounter  Procedures   DEXAScan     Follow-up Plan Follow-up with Sunnie Nielsen, DO as planned Schedule your shingles vaccine at your pharmacy. Bone density referral has been sent and they will call you to schedule. Please let us know when you are ready for the lung cancer screening.  Flu vaccine can be done when you come for your appointment Thursday to see Christen Butter, NP. Medicare wellness visit in one year.  Patient will access AVS on mychart.   I have personally reviewed and noted the following in the patient's chart:   Medical and social history Use of alcohol, tobacco or illicit drugs  Current medications and supplements Functional ability and status Nutritional status Physical activity Advanced directives List of other physicians Hospitalizations, surgeries, and ER visits in previous 12 months Vitals Screenings to include cognitive,  depression, and falls Referrals and appointments  In addition, I have reviewed and discussed with  Faye Ramsay certain preventive protocols, quality metrics, and best practice recommendations. A written personalized care plan for preventive services as well as general preventive health recommendations is available and can be mailed to the patient at her request.      Modesto Charon, RN  05/07/2021

## 2021-05-07 NOTE — Patient Instructions (Addendum)
MEDICARE ANNUAL WELLNESS VISIT Health Maintenance Summary and Written Plan of Care  Ms. Staubs ,  Thank you for allowing me to perform your Medicare Annual Wellness Visit and for your ongoing commitment to your health.   Health Maintenance & Immunization History Health Maintenance  Topic Date Due   COVID-19 Vaccine (4 - Booster for Moderna series) 05/23/2021 (Originally 09/11/2020)   Zoster Vaccines- Shingrix (1 of 2) 08/07/2021 (Originally 05/09/1992)   INFLUENZA VACCINE  10/12/2021 (Originally 02/12/2021)   Hepatitis C Screening  05/07/2022 (Originally 05/09/1959)   TETANUS/TDAP  05/14/2026   Pneumonia Vaccine 11+ Years old  Completed   DEXA SCAN  Completed   HPV VACCINES  Aged Out   Immunization History  Administered Date(s) Administered   Fluad Quad(high Dose 65+) 05/10/2019   Influenza, High Dose Seasonal PF 05/14/2016, 05/12/2017   Influenza,inj,Quad PF,6+ Mos 07/25/2013, 06/21/2014, 06/02/2015, 03/04/2018   Influenza-Unspecified 07/05/2020   Moderna Sars-Covid-2 Vaccination 12/07/2019, 01/07/2020, 07/17/2020   Pneumococcal Conjugate-13 12/08/2015   Pneumococcal Polysaccharide-23 07/25/2013   Tdap 05/14/2016    These are the patient goals that we discussed:  Goals Addressed               This Visit's Progress     Patient Stated (pt-stated)        05/07/2021 AWV Goal: Exercise for General Health  Patient will verbalize understanding of the benefits of increased physical activity: Exercising regularly is important. It will improve your overall fitness, flexibility, and endurance. Regular exercise also will improve your overall health. It can help you control your weight, reduce stress, and improve your bone density. Over the next year, patient will increase physical activity as tolerated with a goal of at least 150 minutes of moderate physical activity per week.  You can tell that you are exercising at a moderate intensity if your heart starts beating faster and  you start breathing faster but can still hold a conversation. Moderate-intensity exercise ideas include: Walking 1 mile (1.6 km) in about 15 minutes Biking Hiking Golfing Dancing Water aerobics Patient will verbalize understanding of everyday activities that increase physical activity by providing examples like the following: Yard work, such as: Insurance underwriter Gardening Washing windows or floors Patient will be able to explain general safety guidelines for exercising:  Before you start a new exercise program, talk with your health care provider. Do not exercise so much that you hurt yourself, feel dizzy, or get very short of breath. Wear comfortable clothes and wear shoes with good support. Drink plenty of water while you exercise to prevent dehydration or heat stroke. Work out until your breathing and your heartbeat get faster.          This is a list of Health Maintenance Items that are overdue or due now: Influenza vaccine Bone densitometry screening Shingles vaccine  Orders/Referrals Placed Today: Orders Placed This Encounter  Procedures   DEXAScan    Standing Status:   Future    Standing Expiration Date:   05/07/2022    Scheduling Instructions:     Please call patient to schedule.    Order Specific Question:   Reason for exam:    Answer:   Post Menopausal    Order Specific Question:   Preferred imaging location?    Answer:   [facility]    (Contact our referral department at [phone] if you have not spoken with someone about your referral appointment within the  next 5 days)    Follow-up Plan Follow-up with Sunnie Nielsen, DO as planned Schedule your shingles vaccine at your pharmacy. Bone density referral has been sent and they will call you to schedule. Please let us know when you are ready for the lung cancer screening.  Flu vaccine can be done when you  come for your appointment Thursday to see Christen Butter, NP. Medicare wellness visit in one year.  Patient will access AVS on mychart.      Health Maintenance, Female Adopting a healthy lifestyle and getting preventive care are important in promoting health and wellness. Ask your health care provider about: The right schedule for you to have regular tests and exams. Things you can do on your own to prevent diseases and keep yourself healthy. What should I know about diet, weight, and exercise? Eat a healthy diet  Eat a diet that includes plenty of vegetables, fruits, low-fat dairy products, and lean protein. Do not eat a lot of foods that are high in solid fats, added sugars, or sodium. Maintain a healthy weight Body mass index (BMI) is used to identify weight problems. It estimates body fat based on height and weight. Your health care provider can help determine your BMI and help you achieve or maintain a healthy weight. Get regular exercise Get regular exercise. This is one of the most important things you can do for your health. Most adults should: Exercise for at least 150 minutes each week. The exercise should increase your heart rate and make you sweat (moderate-intensity exercise). Do strengthening exercises at least twice a week. This is in addition to the moderate-intensity exercise. Spend less time sitting. Even light physical activity can be beneficial. Watch cholesterol and blood lipids Have your blood tested for lipids and cholesterol at 79 years of age, then have this test every 5 years. Have your cholesterol levels checked more often if: Your lipid or cholesterol levels are high. You are older than 79 years of age. You are at high risk for heart disease. What should I know about cancer screening? Depending on your health history and family history, you may need to have cancer screening at various ages. This may include screening for: Breast cancer. Cervical  cancer. Colorectal cancer. Skin cancer. Lung cancer. What should I know about heart disease, diabetes, and high blood pressure? Blood pressure and heart disease High blood pressure causes heart disease and increases the risk of stroke. This is more likely to develop in people who have high blood pressure readings, are of African descent, or are overweight. Have your blood pressure checked: Every 3-5 years if you are 40-49 years of age. Every year if you are 48 years old or older. Diabetes Have regular diabetes screenings. This checks your fasting blood sugar level. Have the screening done: Once every three years after age 48 if you are at a normal weight and have a low risk for diabetes. More often and at a younger age if you are overweight or have a high risk for diabetes. What should I know about preventing infection? Hepatitis B If you have a higher risk for hepatitis B, you should be screened for this virus. Talk with your health care provider to find out if you are at risk for hepatitis B infection. Hepatitis C Testing is recommended for: Everyone born from [birth year] through 1964. Anyone with known risk factors for hepatitis C. Sexually transmitted infections (STIs) Get screened for STIs, including gonorrhea and chlamydia, if: You are sexually active and  are younger than 79 years of age. You are older than 79 years of age and your health care provider tells you that you are at risk for this type of infection. Your sexual activity has changed since you were last screened, and you are at increased risk for chlamydia or gonorrhea. Ask your health care provider if you are at risk. Ask your health care provider about whether you are at high risk for HIV. Your health care provider may recommend a prescription medicine to help prevent HIV infection. If you choose to take medicine to prevent HIV, you should first get tested for HIV. You should then be tested every 3 months for as long as you are  taking the medicine. Pregnancy If you are about to stop having your period (premenopausal) and you may become pregnant, seek counseling before you get pregnant. Take 400 to 800 micrograms (mcg) of folic acid every day if you become pregnant. Ask for birth control (contraception) if you want to prevent pregnancy. Osteoporosis and menopause Osteoporosis is a disease in which the bones lose minerals and strength with aging. This can result in bone fractures. If you are 38 years old or older, or if you are at risk for osteoporosis and fractures, ask your health care provider if you should: Be screened for bone loss. Take a calcium or vitamin D supplement to lower your risk of fractures. Be given hormone replacement therapy (HRT) to treat symptoms of menopause. Follow these instructions at home: Lifestyle Do not use any products that contain nicotine or tobacco, such as cigarettes, e-cigarettes, and chewing tobacco. If you need help quitting, ask your health care provider. Do not use street drugs. Do not share needles. Ask your health care provider for help if you need support or information about quitting drugs. Alcohol use Do not drink alcohol if: Your health care provider tells you not to drink. You are pregnant, may be pregnant, or are planning to become pregnant. If you drink alcohol: Limit how much you use to 0-1 drink a day. Limit intake if you are breastfeeding. Be aware of how much alcohol is in your drink. In the [place] one drink equals one 12 oz bottle of beer (355 mL), one 5 oz glass of wine (148 mL), or one 1 oz glass of hard liquor (44 mL). General instructions Schedule regular health, dental, and eye exams. Stay current with your vaccines. Tell your health care provider if: You often feel depressed. You have ever been abused or do not feel safe at home. Summary Adopting a healthy lifestyle and getting preventive care are important in promoting health and wellness. Follow  your health care provider's instructions about healthy diet, exercising, and getting tested or screened for diseases. Follow your health care provider's instructions on monitoring your cholesterol and blood pressure. This information is not intended to replace advice given to you by your health care provider. Make sure you discuss any questions you have with your health care provider. Document Revised: 09/08/2020 Document Reviewed: 06/24/2018 Elsevier Patient Education  2022 [employer].

## 2021-05-10 ENCOUNTER — Other Ambulatory Visit: Payer: Self-pay

## 2021-05-10 ENCOUNTER — Ambulatory Visit (INDEPENDENT_AMBULATORY_CARE_PROVIDER_SITE_OTHER): Payer: Medicare Other | Admitting: Medical-Surgical

## 2021-05-10 ENCOUNTER — Ambulatory Visit: Payer: Medicare Other | Admitting: Osteopathic Medicine

## 2021-05-10 ENCOUNTER — Encounter: Payer: Self-pay | Admitting: Medical-Surgical

## 2021-05-10 VITALS — BP 145/78 | HR 86 | Resp 20 | Ht 67.5 in | Wt 152.8 lb

## 2021-05-10 DIAGNOSIS — Z720 Tobacco use: Secondary | ICD-10-CM

## 2021-05-10 DIAGNOSIS — Z7689 Persons encountering health services in other specified circumstances: Secondary | ICD-10-CM

## 2021-05-10 DIAGNOSIS — I10 Essential (primary) hypertension: Secondary | ICD-10-CM

## 2021-05-10 DIAGNOSIS — J42 Unspecified chronic bronchitis: Secondary | ICD-10-CM

## 2021-05-10 DIAGNOSIS — Z23 Encounter for immunization: Secondary | ICD-10-CM | POA: Diagnosis not present

## 2021-05-10 DIAGNOSIS — R7303 Prediabetes: Secondary | ICD-10-CM

## 2021-05-10 DIAGNOSIS — I5032 Chronic diastolic (congestive) heart failure: Secondary | ICD-10-CM

## 2021-05-10 DIAGNOSIS — Z789 Other specified health status: Secondary | ICD-10-CM | POA: Diagnosis not present

## 2021-05-10 MED ORDER — AMLODIPINE BESYLATE 5 MG PO TABS: 5.0000 mg | ORAL_TABLET | Freq: Every day | ORAL | 3 refills | Status: DC

## 2021-05-10 MED ORDER — LOSARTAN POTASSIUM 50 MG PO TABS: 50.0000 mg | ORAL_TABLET | Freq: Every day | ORAL | 3 refills | Status: DC

## 2021-05-10 MED ORDER — PRAVASTATIN SODIUM 40 MG PO TABS: 40.0000 mg | ORAL_TABLET | Freq: Every day | ORAL | 3 refills | Status: DC

## 2021-05-10 NOTE — Progress Notes (Signed)
  HPI with pertinent ROS:   CC: Transfer of care, chronic disease follow-up  HPI: Pleasant 80 year old female presenting today to transfer care to a new PCP and for chronic disease follow-up.  Prediabetes-last hemoglobin A1c showing 5.9% indicating prediabetes.  COPD-currently using albuterol inhaler on as-needed basis.  Continues to use tobacco products.  Hypertension-taking amlodipine 5 mg daily, Lasix 2 refresh note 0 mg daily, and losartan 50 mg daily as prescribed.  Tolerating all medications well without side effects. Denies CP, SOB, palpitations, lower extremity edema, dizziness, headaches, or vision changes.  CHF-taking Lasix 40 mg daily along with her other cardiac medications.  I reviewed the past medical history, family history, social history, surgical history, and allergies today and no changes were needed.  Please see the problem list section below in epic for further details.   Physical exam:   General: Well Developed, well nourished, and in no acute distress.  Neuro: Alert and oriented x3.  HEENT: Normocephalic, atraumatic.  Skin: Warm and dry. Cardiac: Regular rate and rhythm, no murmurs rubs or gallops, no lower extremity edema.  Respiratory: Clear to auscultation bilaterally. Not using accessory muscles, speaking in full sentences.  Impression and Recommendations:    1. Encounter to establish care Reviewed available information and discussed care concerns with patient.      2. Tobacco abuse Recommend smoking cessation.  Cessation options are available for treatment.  3. Prediabetes Checking hemoglobin A1c today. - Hemoglobin A1c  4. Chronic bronchitis, unspecified chronic bronchitis type (HCC) Continue albuterol inhaler as needed.  Recommend smoking cessation.  5. Primary hypertension Blood pressure slightly elevated but at an acceptable range for her age.  Continue amlodipine 5 mg daily, losartan 50 mg daily, and furosemide 20 mg daily.  Checking CBC  with differential and CMP today. - CBC with Differential/Platelet - CMP14+EGFR  6. Chronic diastolic congestive heart failure ([place]) Checking labs as below.  Continue furosemide and hypertension meds as prescribed. - CBC with Differential/Platelet - CMP14+EGFR  7. Flu vaccine need Flu vaccine in office today. - Flu Vaccine QUAD High Dose(Fluad)  8. Needs assistance with community resources Per patient request, entering referral for social work to inquire about age for food and transportation resources. - Ambulatory referral to Social Work  Return in about 6 months (around 11/08/2021) for HLD/HTN/prediabetes follow up. ___________________________________________ Clearnce Sorrel, DNP, APRN, FNP-BC [facility] [place]

## 2021-05-11 LAB — CBC WITH DIFFERENTIAL/PLATELET
Basophils Absolute: 0 10*3/uL (ref 0.0–0.2)
Basos: 1 %
EOS (ABSOLUTE): 0.2 10*3/uL (ref 0.0–0.4)
Eos: 3 %
Hematocrit: 46.2 % (ref 34.0–46.6)
Hemoglobin: 15.2 g/dL (ref 11.1–15.9)
Immature Grans (Abs): 0 10*3/uL (ref 0.0–0.1)
Immature Granulocytes: 0 %
Lymphocytes Absolute: 1.7 10*3/uL (ref 0.7–3.1)
Lymphs: 31 %
MCH: 27.8 pg (ref 26.6–33.0)
MCHC: 32.9 g/dL (ref 31.5–35.7)
MCV: 85 fL (ref 79–97)
Monocytes Absolute: 0.6 10*3/uL (ref 0.1–0.9)
Monocytes: 10 %
Neutrophils Absolute: 3.1 10*3/uL (ref 1.4–7.0)
Neutrophils: 55 %
Platelets: 167 10*3/uL (ref 150–450)
RBC: 5.46 x10E6/uL — ABNORMAL HIGH (ref 3.77–5.28)
RDW: 14.5 % (ref 11.7–15.4)
WBC: 5.7 10*3/uL (ref 3.4–10.8)

## 2021-05-11 LAB — CMP14+EGFR
ALT: 8 IU/L (ref 0–32)
AST: 20 IU/L (ref 0–40)
Albumin/Globulin Ratio: 1.6 (ref 1.2–2.2)
Albumin: 4.4 g/dL (ref 3.7–4.7)
Alkaline Phosphatase: 80 IU/L (ref 44–121)
BUN/Creatinine Ratio: 9 — ABNORMAL LOW (ref 12–28)
BUN: 9 mg/dL (ref 8–27)
Bilirubin Total: 0.3 mg/dL (ref 0.0–1.2)
CO2: 28 mmol/L (ref 20–29)
Calcium: 9.3 mg/dL (ref 8.7–10.3)
Chloride: 104 mmol/L (ref 96–106)
Creatinine, Ser: 0.97 mg/dL (ref 0.57–1.00)
Globulin, Total: 2.8 g/dL (ref 1.5–4.5)
Glucose: 101 mg/dL — ABNORMAL HIGH (ref 70–99)
Potassium: 4.2 mmol/L (ref 3.5–5.2)
Sodium: 146 mmol/L — ABNORMAL HIGH (ref 134–144)
Total Protein: 7.2 g/dL (ref 6.0–8.5)
eGFR: 59 mL/min/{1.73_m2} — ABNORMAL LOW (ref >59)

## 2021-05-11 LAB — HEMOGLOBIN A1C
Est. average glucose Bld gHb Est-mCnc: 126 mg/dL
Hgb A1c MFr Bld: 6 % — ABNORMAL HIGH (ref 4.8–5.6)

## 2021-06-14 ENCOUNTER — Other Ambulatory Visit: Payer: Self-pay

## 2021-06-14 DIAGNOSIS — I5032 Chronic diastolic (congestive) heart failure: Secondary | ICD-10-CM

## 2021-06-14 MED ORDER — FUROSEMIDE 40 MG PO TABS: 20.0000 mg | ORAL_TABLET | Freq: Every day | ORAL | 3 refills | Status: DC

## 2021-06-20 ENCOUNTER — Encounter: Payer: Self-pay | Admitting: Medical-Surgical

## 2021-06-20 DIAGNOSIS — I5032 Chronic diastolic (congestive) heart failure: Secondary | ICD-10-CM

## 2021-06-21 NOTE — Telephone Encounter (Signed)
I would have her continue with her current strength of 40mg /day for now.  Copying Joy on this message as well to see if she wants her to reduce.

## 2021-06-22 MED ORDER — FUROSEMIDE 40 MG PO TABS: 40.0000 mg | ORAL_TABLET | Freq: Every day | ORAL | 3 refills | Status: DC

## 2021-06-22 NOTE — Telephone Encounter (Signed)
Called and spoke with pt.  Advised her of Joy Jessup's comments.  Pt expressed understanding and is agreeable.  Tiajuana Amass, CMA

## 2021-06-25 DIAGNOSIS — Z20822 Contact with and (suspected) exposure to covid-19: Secondary | ICD-10-CM | POA: Diagnosis not present

## 2021-06-27 ENCOUNTER — Ambulatory Visit (INDEPENDENT_AMBULATORY_CARE_PROVIDER_SITE_OTHER): Payer: Medicare Other

## 2021-06-27 ENCOUNTER — Other Ambulatory Visit: Payer: Self-pay

## 2021-06-27 DIAGNOSIS — Z780 Asymptomatic menopausal state: Secondary | ICD-10-CM | POA: Diagnosis not present

## 2021-06-27 DIAGNOSIS — M810 Age-related osteoporosis without current pathological fracture: Secondary | ICD-10-CM | POA: Diagnosis not present

## 2021-06-27 DIAGNOSIS — Z0000 Encounter for general adult medical examination without abnormal findings: Secondary | ICD-10-CM | POA: Diagnosis not present

## 2021-06-29 ENCOUNTER — Encounter: Payer: Self-pay | Admitting: Medical-Surgical

## 2021-07-18 ENCOUNTER — Other Ambulatory Visit: Payer: Self-pay

## 2021-07-18 DIAGNOSIS — M810 Age-related osteoporosis without current pathological fracture: Secondary | ICD-10-CM

## 2021-07-18 MED ORDER — ALENDRONATE SODIUM 70 MG PO TABS: 70.0000 mg | ORAL_TABLET | ORAL | 3 refills | Status: DC

## 2021-07-18 NOTE — Progress Notes (Signed)
Order signed for Fosamax 70mg  once weekly.   ___________________________________________ , DNP, APRN, FNP-BC Primary Care and Sports Medicine [facility] [place]

## 2021-07-18 NOTE — Progress Notes (Signed)
See pt message from 12/16. Angelica Mcbride states that Fosamax is covered by her insurance and would like to start it. New prescription pended.

## 2021-09-06 ENCOUNTER — Other Ambulatory Visit: Payer: Self-pay

## 2021-09-06 DIAGNOSIS — I5032 Chronic diastolic (congestive) heart failure: Secondary | ICD-10-CM

## 2021-09-06 MED ORDER — AMLODIPINE BESYLATE 5 MG PO TABS: 5.0000 mg | ORAL_TABLET | Freq: Every day | ORAL | 0 refills | Status: DC

## 2021-09-06 MED ORDER — LOSARTAN POTASSIUM 50 MG PO TABS: 50.0000 mg | ORAL_TABLET | Freq: Every day | ORAL | 0 refills | Status: DC

## 2021-09-06 MED ORDER — PRAVASTATIN SODIUM 40 MG PO TABS: 40.0000 mg | ORAL_TABLET | Freq: Every day | ORAL | 0 refills | Status: DC

## 2021-09-06 MED ORDER — FUROSEMIDE 40 MG PO TABS: 40.0000 mg | ORAL_TABLET | Freq: Every day | ORAL | 0 refills | Status: DC

## 2021-10-24 ENCOUNTER — Other Ambulatory Visit: Payer: Self-pay | Admitting: Medical-Surgical

## 2021-10-24 DIAGNOSIS — I5032 Chronic diastolic (congestive) heart failure: Secondary | ICD-10-CM

## 2021-11-07 ENCOUNTER — Other Ambulatory Visit: Payer: Self-pay | Admitting: Medical-Surgical

## 2021-11-08 ENCOUNTER — Ambulatory Visit (INDEPENDENT_AMBULATORY_CARE_PROVIDER_SITE_OTHER): Payer: Medicare Other | Admitting: Medical-Surgical

## 2021-11-08 ENCOUNTER — Encounter: Payer: Self-pay | Admitting: Medical-Surgical

## 2021-11-08 VITALS — BP 122/78 | HR 78 | Resp 20 | Ht 67.5 in | Wt 145.6 lb

## 2021-11-08 DIAGNOSIS — R7303 Prediabetes: Secondary | ICD-10-CM | POA: Diagnosis not present

## 2021-11-08 DIAGNOSIS — I10 Essential (primary) hypertension: Secondary | ICD-10-CM | POA: Diagnosis not present

## 2021-11-08 DIAGNOSIS — E559 Vitamin D deficiency, unspecified: Secondary | ICD-10-CM | POA: Diagnosis not present

## 2021-11-08 DIAGNOSIS — M810 Age-related osteoporosis without current pathological fracture: Secondary | ICD-10-CM | POA: Diagnosis not present

## 2021-11-08 DIAGNOSIS — J42 Unspecified chronic bronchitis: Secondary | ICD-10-CM

## 2021-11-08 MED ORDER — FUROSEMIDE 20 MG PO TABS: 20.0000 mg | ORAL_TABLET | Freq: Every day | ORAL | 0 refills | Status: DC

## 2021-11-08 NOTE — Progress Notes (Signed)
?  HPI with pertinent ROS:  ? ?CC: Chronic disease follow-up ? ?HPI: ?Pleasant 81 year old female presenting today for the following: ? ?Hypertension- taking losartan 50mg , Lasix 40mg , and Amlodipine 5mg  daily as prescribed. Having trouble with excessive urination for several hours after taking Lasix that she has noticed over the past couple of months. No other urinary symptoms. Denies CP, SOB, palpitations, lower extremity edema, dizziness, headaches, or vision changes.  ? ?COPD- doing well overall. Using albuterol inhaler prn but not needing it frequently. Not followed by pulmonology.  ? ?Prediabetes- last HgbA1c 6.0%. Not checking sugars at home. No current medications.  ? ?Vitamin D deficiency- Stable for the past several years. Taking oral vitamin D supplementation daily.  ? ?Osteoporosis- started Fosamax 70mg  weekly, tolerating well without side effects. Taking it as instructed. Staying active as much as possible.  ? ?I reviewed the past medical history, family history, social history, surgical history, and allergies today and no changes were needed.  Please see the problem list section below in epic for further details. ? ? ?Physical exam:  ? ?General: Well Developed, well nourished, and in no acute distress.  ?Neuro: Alert and oriented x3.  ?HEENT: Normocephalic, atraumatic.  ?Skin: Warm and dry. ?Cardiac: Regular rate and rhythm, no murmurs rubs or gallops, no lower extremity edema.  ?Respiratory: Clear to auscultation bilaterally. Not using accessory muscles, speaking in full sentences. ? ?Impression and Recommendations:   ? ?1. Primary hypertension ?Checking labs. BP at goal. Continue Losartan 50mg  and Amlodipine 5mg  daily. Can try to reduce Lasix to 20mg  daily to help with urinary frequency but monitor for edema or worsening shortness of breath. If this occurs, return to 40mg  daily.  ?- CBC with Differential/Platelet ?- COMPLETE METABOLIC PANEL WITH GFR ?- Lipid panel ? ?2. Chronic bronchitis, unspecified  chronic bronchitis type ([place]) ?Stable. Continue prn albuterol inhaler.  ? ?3. Prediabetes ?Checking A1c.  ?- Hemoglobin A1c ? ?4. Vitamin D deficiency ?Stable. Continue oral supplementation.  ? ?5. Osteoporosis of femur without pathological fracture ?Continue Fosamax 70mg  weekly. Continue to work on increasing activity and weight bearing exercise.  ? ?Return in about 6 months (around 05/10/2022) for chronic disease follow up. ?___________________________________________ ?Clearnce Sorrel, DNP, APRN, FNP-BC ?Primary Care and Sports Medicine ?[place] ?

## 2021-11-09 LAB — HEMOGLOBIN A1C
Hgb A1c MFr Bld: 5.8 % of total Hgb — ABNORMAL HIGH (ref <5.7)
Mean Plasma Glucose: 120 mg/dL
eAG (mmol/L): 6.6 mmol/L

## 2021-11-09 LAB — LIPID PANEL
Cholesterol: 129 mg/dL (ref <200)
HDL: 48 mg/dL — ABNORMAL LOW (ref >=50)
LDL Cholesterol (Calc): 65 mg/dL (calc)
Non-HDL Cholesterol (Calc): 81 mg/dL (calc) (ref <130)
Total CHOL/HDL Ratio: 2.7 (calc) (ref <5.0)
Triglycerides: 76 mg/dL (ref <150)

## 2021-11-09 LAB — COMPLETE METABOLIC PANEL WITH GFR
AG Ratio: 1.2 (calc) (ref 1.0–2.5)
ALT: 5 U/L — ABNORMAL LOW (ref 6–29)
AST: 15 U/L (ref 10–35)
Albumin: 4.1 g/dL (ref 3.6–5.1)
Alkaline phosphatase (APISO): 69 U/L (ref 37–153)
BUN/Creatinine Ratio: 10 (calc) (ref 6–22)
BUN: 11 mg/dL (ref 7–25)
CO2: 32 mmol/L (ref 20–32)
Calcium: 9.2 mg/dL (ref 8.6–10.4)
Chloride: 105 mmol/L (ref 98–110)
Creat: 1.07 mg/dL — ABNORMAL HIGH (ref 0.60–1.00)
Globulin: 3.5 g/dL (calc) (ref 1.9–3.7)
Glucose, Bld: 86 mg/dL (ref 65–99)
Potassium: 4.5 mmol/L (ref 3.5–5.3)
Sodium: 145 mmol/L (ref 135–146)
Total Bilirubin: 0.5 mg/dL (ref 0.2–1.2)
Total Protein: 7.6 g/dL (ref 6.1–8.1)
eGFR: 53 mL/min/{1.73_m2} — ABNORMAL LOW (ref >=60)

## 2021-11-09 LAB — CBC WITH DIFFERENTIAL/PLATELET
Absolute Monocytes: 562 cells/uL (ref 200–950)
Basophils Absolute: 32 cells/uL (ref 0–200)
Basophils Relative: 0.6 %
Eosinophils Absolute: 211 cells/uL (ref 15–500)
Eosinophils Relative: 3.9 %
HCT: 49.1 % — ABNORMAL HIGH (ref 35.0–45.0)
Hemoglobin: 15.9 g/dL — ABNORMAL HIGH (ref 11.7–15.5)
Lymphs Abs: 1598 cells/uL (ref 850–3900)
MCH: 28.4 pg (ref 27.0–33.0)
MCHC: 32.4 g/dL (ref 32.0–36.0)
MCV: 87.8 fL (ref 80.0–100.0)
MPV: 12.1 fL (ref 7.5–12.5)
Monocytes Relative: 10.4 %
Neutro Abs: 2997 cells/uL (ref 1500–7800)
Neutrophils Relative %: 55.5 %
Platelets: 174 10*3/uL (ref 140–400)
RBC: 5.59 10*6/uL — ABNORMAL HIGH (ref 3.80–5.10)
RDW: 14.9 % (ref 11.0–15.0)
Total Lymphocyte: 29.6 %
WBC: 5.4 10*3/uL (ref 3.8–10.8)

## 2021-12-14 ENCOUNTER — Other Ambulatory Visit: Payer: Self-pay | Admitting: Medical-Surgical

## 2021-12-14 DIAGNOSIS — I5032 Chronic diastolic (congestive) heart failure: Secondary | ICD-10-CM

## 2021-12-17 MED ORDER — FUROSEMIDE 40 MG PO TABS: 40.0000 mg | ORAL_TABLET | Freq: Every day | ORAL | 3 refills | Status: DC

## 2021-12-17 NOTE — Telephone Encounter (Signed)
Pt is requesting a dosage change.  Please review and refill if appropriate.  Tiajuana Amass, CMA

## 2021-12-31 ENCOUNTER — Other Ambulatory Visit: Payer: Self-pay | Admitting: *Deleted

## 2021-12-31 ENCOUNTER — Encounter: Payer: Self-pay | Admitting: Medical-Surgical

## 2021-12-31 DIAGNOSIS — J449 Chronic obstructive pulmonary disease, unspecified: Secondary | ICD-10-CM

## 2021-12-31 MED ORDER — ALBUTEROL SULFATE HFA 108 (90 BASE) MCG/ACT IN AERS: 2.0000 | INHALATION_SPRAY | RESPIRATORY_TRACT | 99 refills | Status: AC | PRN

## 2022-01-02 ENCOUNTER — Other Ambulatory Visit: Payer: Self-pay | Admitting: Medical-Surgical

## 2022-01-05 DIAGNOSIS — R918 Other nonspecific abnormal finding of lung field: Secondary | ICD-10-CM | POA: Diagnosis not present

## 2022-01-05 DIAGNOSIS — Z8249 Family history of ischemic heart disease and other diseases of the circulatory system: Secondary | ICD-10-CM | POA: Diagnosis not present

## 2022-01-05 DIAGNOSIS — J9601 Acute respiratory failure with hypoxia: Secondary | ICD-10-CM | POA: Diagnosis not present

## 2022-01-05 DIAGNOSIS — J9811 Atelectasis: Secondary | ICD-10-CM | POA: Diagnosis not present

## 2022-01-05 DIAGNOSIS — Z7951 Long term (current) use of inhaled steroids: Secondary | ICD-10-CM | POA: Diagnosis not present

## 2022-01-05 DIAGNOSIS — N3000 Acute cystitis without hematuria: Secondary | ICD-10-CM | POA: Diagnosis not present

## 2022-01-05 DIAGNOSIS — I110 Hypertensive heart disease with heart failure: Secondary | ICD-10-CM | POA: Diagnosis not present

## 2022-01-05 DIAGNOSIS — R7303 Prediabetes: Secondary | ICD-10-CM | POA: Diagnosis not present

## 2022-01-05 DIAGNOSIS — J449 Chronic obstructive pulmonary disease, unspecified: Secondary | ICD-10-CM | POA: Diagnosis not present

## 2022-01-05 DIAGNOSIS — I3139 Other pericardial effusion (noninflammatory): Secondary | ICD-10-CM | POA: Diagnosis not present

## 2022-01-05 DIAGNOSIS — Z79899 Other long term (current) drug therapy: Secondary | ICD-10-CM | POA: Diagnosis not present

## 2022-01-05 DIAGNOSIS — E876 Hypokalemia: Secondary | ICD-10-CM | POA: Diagnosis not present

## 2022-01-05 DIAGNOSIS — I5032 Chronic diastolic (congestive) heart failure: Secondary | ICD-10-CM | POA: Diagnosis not present

## 2022-01-05 DIAGNOSIS — I10 Essential (primary) hypertension: Secondary | ICD-10-CM | POA: Diagnosis not present

## 2022-01-05 DIAGNOSIS — J441 Chronic obstructive pulmonary disease with (acute) exacerbation: Secondary | ICD-10-CM | POA: Diagnosis not present

## 2022-01-05 DIAGNOSIS — R0602 Shortness of breath: Secondary | ICD-10-CM | POA: Diagnosis not present

## 2022-01-05 DIAGNOSIS — J189 Pneumonia, unspecified organism: Secondary | ICD-10-CM | POA: Insufficient documentation

## 2022-01-05 DIAGNOSIS — Z20822 Contact with and (suspected) exposure to covid-19: Secondary | ICD-10-CM | POA: Diagnosis not present

## 2022-01-05 DIAGNOSIS — F17210 Nicotine dependence, cigarettes, uncomplicated: Secondary | ICD-10-CM | POA: Diagnosis not present

## 2022-01-05 DIAGNOSIS — Z720 Tobacco use: Secondary | ICD-10-CM | POA: Diagnosis not present

## 2022-01-09 NOTE — Progress Notes (Signed)
   Established Patient Office Visit  Subjective   Patient ID: Angelica Mcbride, female   DOB: 25-Dec-1940 Age: 81 y.o. MRN: [id number]   Chief Complaint  Patient presents with   Hospitalization Follow-up    HPI Pleasant 81 year old female presenting today for a hospital discharge follow up. On 6/24, she presented to urgent care for urinary symptoms x 1 week but had an oxygen level of 86% on room air. She was taken to the ED for further evaluation where she was admitted with Acute respiratory failure with hypoxia, Pneumonia, and COPD exacerbation. She was discharged on 6/26 on oral steroids and antibiotics. Using oxygen 2L via [place] on a prn basis and is using that when at rest most of the time. Monitoring her POX and notes that most of the time she has good number but if it drops to 88% or below she makes sure to put her oxygen on. Has been taking her steroids and antibiotics as prescribed, tolerating well without side effects. Notes that she quit smoking after almost 60 years. She stopped on Monday and is now using Nicotine patches instead. Has had an increase in her appetite since then.    Objective:    Vitals:   01/10/22 1053  BP: 129/81  Pulse: 86  Resp: 20  Height: 5' 7.5" (1.715 m)  Weight: 140 lb 0.6 oz (63.5 kg)  SpO2: 91%  BMI (Calculated): 21.6    Physical Exam Vitals and nursing note reviewed.  Constitutional:      General: She is not in acute distress.    Appearance: Normal appearance. She is normal weight. She is not ill-appearing.  HENT:     Head: Normocephalic and atraumatic.  Cardiovascular:     Rate and Rhythm: Normal rate and regular rhythm.     Pulses: Normal pulses.     Heart sounds: Normal heart sounds.  Pulmonary:     Effort: Pulmonary effort is normal. No respiratory distress.     Breath sounds: Decreased breath sounds present. No wheezing, rhonchi or rales.  Skin:    General: Skin is warm and dry.  Neurological:     Mental Status: She is alert and oriented  to person, place, and time.  Psychiatric:        Mood and Affect: Mood normal.        Behavior: Behavior normal.        Thought Content: Thought content normal.        Judgment: Judgment normal.   No results found for this or any previous visit (from the past 24 hour(s)).     The ASCVD Risk score (Arnett DK, et al., 2019) failed to calculate for the following reasons:   The valid total cholesterol range is 130 to 320 mg/dL   Assessment & Plan:   1. Hospital discharge follow-up 2. Chronic bronchitis, unspecified chronic bronchitis type [facility]) Reviewed hospital records. Continue steroids and antibiotics as prescribed. Avoid smoking and continue nicotine patches daily. Oxygen at 2L, have [employer] send any order requests or inquiries to our office. Continue Albuterol prn. Repeat chest x-ray in 2-3 weeks. Order placed.  - DG Chest 2 View; Future   Return in about 3 months (around 04/12/2022) for COPD follow up.  ___________________________________________ Thayer Ohm, DNP, APRN, FNP-BC Primary Care and Sports Medicine [facility] [place]

## 2022-01-10 ENCOUNTER — Ambulatory Visit (INDEPENDENT_AMBULATORY_CARE_PROVIDER_SITE_OTHER): Payer: Medicare Other | Admitting: Medical-Surgical

## 2022-01-10 ENCOUNTER — Encounter: Payer: Self-pay | Admitting: Medical-Surgical

## 2022-01-10 VITALS — BP 129/81 | HR 86 | Resp 20 | Ht 67.5 in | Wt 140.0 lb

## 2022-01-10 DIAGNOSIS — Z09 Encounter for follow-up examination after completed treatment for conditions other than malignant neoplasm: Secondary | ICD-10-CM | POA: Diagnosis not present

## 2022-01-10 DIAGNOSIS — J42 Unspecified chronic bronchitis: Secondary | ICD-10-CM

## 2022-02-06 DIAGNOSIS — J9601 Acute respiratory failure with hypoxia: Secondary | ICD-10-CM | POA: Diagnosis not present

## 2022-02-07 ENCOUNTER — Ambulatory Visit (INDEPENDENT_AMBULATORY_CARE_PROVIDER_SITE_OTHER): Payer: Medicare Other

## 2022-02-07 DIAGNOSIS — J42 Unspecified chronic bronchitis: Secondary | ICD-10-CM | POA: Diagnosis not present

## 2022-02-07 DIAGNOSIS — Z09 Encounter for follow-up examination after completed treatment for conditions other than malignant neoplasm: Secondary | ICD-10-CM

## 2022-02-07 DIAGNOSIS — J449 Chronic obstructive pulmonary disease, unspecified: Secondary | ICD-10-CM | POA: Diagnosis not present

## 2022-03-09 DIAGNOSIS — J9601 Acute respiratory failure with hypoxia: Secondary | ICD-10-CM | POA: Diagnosis not present

## 2022-03-30 ENCOUNTER — Other Ambulatory Visit: Payer: Self-pay | Admitting: Medical-Surgical

## 2022-03-30 DIAGNOSIS — M810 Age-related osteoporosis without current pathological fracture: Secondary | ICD-10-CM

## 2022-04-05 ENCOUNTER — Other Ambulatory Visit: Payer: Self-pay | Admitting: Medical-Surgical

## 2022-04-09 DIAGNOSIS — J9601 Acute respiratory failure with hypoxia: Secondary | ICD-10-CM | POA: Diagnosis not present

## 2022-05-01 ENCOUNTER — Other Ambulatory Visit: Payer: Self-pay

## 2022-05-01 ENCOUNTER — Encounter: Payer: Self-pay | Admitting: Medical-Surgical

## 2022-05-01 DIAGNOSIS — I5032 Chronic diastolic (congestive) heart failure: Secondary | ICD-10-CM

## 2022-05-01 MED ORDER — FUROSEMIDE 40 MG PO TABS: 40.0000 mg | ORAL_TABLET | Freq: Every day | ORAL | 1 refills | Status: DC

## 2022-05-08 ENCOUNTER — Ambulatory Visit (INDEPENDENT_AMBULATORY_CARE_PROVIDER_SITE_OTHER): Payer: Medicare Other | Admitting: Medical-Surgical

## 2022-05-08 DIAGNOSIS — Z0000 Encounter for general adult medical examination without abnormal findings: Secondary | ICD-10-CM

## 2022-05-08 NOTE — Patient Instructions (Addendum)
[facility] Maintenance Summary and Written Plan of Care  Ms. Daversa ,  Thank you for allowing me to perform your Medicare Annual Wellness Visit and for your ongoing commitment to your health.   Health Maintenance & Immunization History Health Maintenance  Topic Date Due  . Lung Cancer Screening  05/10/2022 (Originally 07/23/2014)  . COVID-19 Vaccine (4 - Moderna risk series) 05/24/2022 (Originally 09/11/2020)  . Zoster Vaccines- Shingrix (1 of 2) 08/08/2022 (Originally 05/09/1960)  . INFLUENZA VACCINE  10/13/2022 (Originally 02/12/2022)  . Hepatitis C Screening  05/09/2023 (Originally 05/09/1959)  . Medicare Annual Wellness (AWV)  06/08/2023  . TETANUS/TDAP  05/14/2026  . Pneumonia Vaccine 80+ Years old  Completed  . DEXA SCAN  Completed  . HPV VACCINES  Aged Out   Immunization History  Administered Date(s) Administered  . Fluad Quad(high Dose 65+) 05/10/2019, 05/10/2021  . Influenza, High Dose Seasonal PF 05/14/2016, 05/12/2017  . Influenza,inj,Quad PF,6+ Mos 07/25/2013, 06/21/2014, 06/02/2015, 03/04/2018  . Influenza-Unspecified 07/05/2020  . Moderna Sars-Covid-2 Vaccination 12/07/2019, 01/07/2020, 07/17/2020  . Pneumococcal Conjugate-13 12/08/2015  . Pneumococcal Polysaccharide-23 07/25/2013  . Tdap 05/14/2016    These are the patient goals that we discussed:  Goals Addressed              This Visit's Progress   .  Patient Stated (pt-stated)        05/08/2022 AWV Goal: Improved Nutrition/Diet  Patient will verbalize understanding that diet plays an important role in overall health and that a poor diet is a risk factor for many chronic medical conditions.  Over the next year, patient will improve self management of their diet by incorporating more water. Patient will utilize available community resources to help with food acquisition if needed (ex: food pantries, [address], etc) Patient will work with nutrition specialist if a referral was  made         This is a list of Health Maintenance Items that are overdue or due now: Influenza vaccine Shingles vaccine  Orders/Referrals Placed Today: No orders of the defined types were placed in this encounter.  (Contact our referral department at [phone] if you have not spoken with someone about your referral appointment within the next 5 days)    Follow-up Plan Follow-up with Samuel Bouche, NP as planned Schedule your shingles vaccine at your pharmacy.  Influenza vaccine can be done at your visit with Joy on Thursday. Medicare wellness visit in one year.  Patient will access AVS on my chart.      Health Maintenance, Female Adopting a healthy lifestyle and getting preventive care are important in promoting health and wellness. Ask your health care provider about: The right schedule for you to have regular tests and exams. Things you can do on your own to prevent diseases and keep yourself healthy. What should I know about diet, weight, and exercise? Eat a healthy diet  Eat a diet that includes plenty of vegetables, fruits, low-fat dairy products, and lean protein. Do not eat a lot of foods that are high in solid fats, added sugars, or sodium. Maintain a healthy weight Body mass index (BMI) is used to identify weight problems. It estimates body fat based on height and weight. Your health care provider can help determine your BMI and help you achieve or maintain a healthy weight. Get regular exercise Get regular exercise. This is one of the most important things you can do for your health. Most adults should: Exercise for at least 150 minutes each  week. The exercise should increase your heart rate and make you sweat (moderate-intensity exercise). Do strengthening exercises at least twice a week. This is in addition to the moderate-intensity exercise. Spend less time sitting. Even light physical activity can be beneficial. Watch cholesterol and blood lipids Have your  blood tested for lipids and cholesterol at 80 years of age, then have this test every 5 years. Have your cholesterol levels checked more often if: Your lipid or cholesterol levels are high. You are older than 80 years of age. You are at high risk for heart disease. What should I know about cancer screening? Depending on your health history and family history, you may need to have cancer screening at various ages. This may include screening for: Breast cancer. Cervical cancer. Colorectal cancer. Skin cancer. Lung cancer. What should I know about heart disease, diabetes, and high blood pressure? Blood pressure and heart disease High blood pressure causes heart disease and increases the risk of stroke. This is more likely to develop in people who have high blood pressure readings or are overweight. Have your blood pressure checked: Every 3-5 years if you are 80-80 years of age. Every year if you are 80 years old or older. Diabetes Have regular diabetes screenings. This checks your fasting blood sugar level. Have the screening done: Once every three years after age 80 if you are at a normal weight and have a low risk for diabetes. More often and at a younger age if you are overweight or have a high risk for diabetes. What should I know about preventing infection? Hepatitis B If you have a higher risk for hepatitis B, you should be screened for this virus. Talk with your health care provider to find out if you are at risk for hepatitis B infection. Hepatitis C Testing is recommended for: Everyone born from [birth year] through 1964. Anyone with known risk factors for hepatitis C. Sexually transmitted infections (STIs) Get screened for STIs, including gonorrhea and chlamydia, if: You are sexually active and are younger than 80 years of age. You are older than 80 years of age and your health care provider tells you that you are at risk for this type of infection. Your sexual activity has changed  since you were last screened, and you are at increased risk for chlamydia or gonorrhea. Ask your health care provider if you are at risk. Ask your health care provider about whether you are at high risk for HIV. Your health care provider may recommend a prescription medicine to help prevent HIV infection. If you choose to take medicine to prevent HIV, you should first get tested for HIV. You should then be tested every 3 months for as long as you are taking the medicine. Pregnancy If you are about to stop having your period (premenopausal) and you may become pregnant, seek counseling before you get pregnant. Take 400 to 800 micrograms (mcg) of folic acid every day if you become pregnant. Ask for birth control (contraception) if you want to prevent pregnancy. Osteoporosis and menopause Osteoporosis is a disease in which the bones lose minerals and strength with aging. This can result in bone fractures. If you are 34 years old or older, or if you are at risk for osteoporosis and fractures, ask your health care provider if you should: Be screened for bone loss. Take a calcium or vitamin D supplement to lower your risk of fractures. Be given hormone replacement therapy (HRT) to treat symptoms of menopause. Follow these instructions at home:  Alcohol use Do not drink alcohol if: Your health care provider tells you not to drink. You are pregnant, may be pregnant, or are planning to become pregnant. If you drink alcohol: Limit how much you have to: 0-1 drink a day. Know how much alcohol is in your drink. In the [place] one drink equals one 12 oz bottle of beer (355 mL), one 5 oz glass of wine (148 mL), or one 1 oz glass of hard liquor (44 mL). Lifestyle Do not use any products that contain nicotine or tobacco. These products include cigarettes, chewing tobacco, and vaping devices, such as e-cigarettes. If you need help quitting, ask your health care provider. Do not use street drugs. Do not share  needles. Ask your health care provider for help if you need support or information about quitting drugs. General instructions Schedule regular health, dental, and eye exams. Stay current with your vaccines. Tell your health care provider if: You often feel depressed. You have ever been abused or do not feel safe at home. Summary Adopting a healthy lifestyle and getting preventive care are important in promoting health and wellness. Follow your health care provider's instructions about healthy diet, exercising, and getting tested or screened for diseases. Follow your health care provider's instructions on monitoring your cholesterol and blood pressure. This information is not intended to replace advice given to you by your health care provider. Make sure you discuss any questions you have with your health care provider. Document Revised: 11/20/2020 Document Reviewed: 11/20/2020 Elsevier Patient Education  [place].

## 2022-05-08 NOTE — Progress Notes (Signed)
MEDICARE ANNUAL WELLNESS VISIT  05/08/2022  Telephone Visit Disclaimer This Medicare AWV was conducted by telephone due to national recommendations for restrictions regarding the COVID-19 Pandemic (e.g. social distancing).  I verified, using two identifiers, that I am speaking with Margy Clarks or their authorized healthcare agent. I discussed the limitations, risks, security, and privacy concerns of performing an evaluation and management service by telephone and the potential availability of an in-person appointment in the future. The patient expressed understanding and agreed to proceed.  Location of Patient: Home Location of Provider (nurse):  In the office.  Subjective:    Alythia Rorke is a 81 y.o. female patient of Samuel Bouche, NP who had a Medicare Annual Wellness Visit today via telephone. Cozetta is Retired and lives with their daughter. she has 1 child. she reports that she is socially active and does interact with friends/family regularly. she is minimally physically active and enjoys working on crossword puzzles.  Patient Care Team: Samuel Bouche, NP as PCP - General (Nurse Practitioner) Tonette Bihari, MD (Inactive)     05/08/2022    9:33 AM 05/07/2021   10:48 AM 06/29/2019   10:04 AM 06/23/2018   10:02 AM 06/02/2015    1:41 PM 11/25/2014    4:09 PM 06/21/2014    3:00 PM  Advanced Directives  Does Patient Have a Medical Advance Directive? No Yes No No No Yes No  Type of Advance Directive  Living will;Healthcare Power of [place]   Does patient want to make changes to medical advance directive?  No - Patient declined       Copy of [place] in Chart?  No - copy requested    No - copy requested   Would patient like information on creating a medical advance directive? No - Patient declined  No - Patient declined Yes (MAU/Ambulatory/Procedural Areas - Information given) Yes - Educational materials given  No - patient  declined information    Hospital Utilization Over the Past 12 Months: # of hospitalizations or ER visits: 1 # of surgeries: 0  Review of Systems    Patient reports that her overall health is better compared to last year.  History obtained from chart review and the patient  Patient Reported Readings (BP, Pulse, CBG, Weight, etc) BP: 150/73 Pulse:  Weight: 155.6 lbs Temp: 96.36F  Pain Assessment Pain : No/denies pain     Current Medications & Allergies (verified) Allergies as of 05/08/2022   No Known Allergies      Medication List        Accurate as of May 08, 2022  9:50 AM. If you have any questions, ask your nurse or doctor.          acetaminophen 500 MG tablet Commonly known as: TYLENOL Take 2 tablets (1,000 mg total) by mouth every 6 (six) hours as needed for mild pain or moderate pain.   albuterol 108 (90 Base) MCG/ACT inhaler Commonly known as: VENTOLIN HFA Inhale 2 puffs into the lungs every 4 (four) hours as needed for wheezing or shortness of breath.   alendronate 70 MG tablet Commonly known as: FOSAMAX TAKE 1 TABLET BY MOUTH ONCE A  WEEK   amLODipine 5 MG tablet Commonly known as: NORVASC TAKE 1 TABLET BY MOUTH DAILY   aspirin 81 MG tablet Take 1 tablet (81 mg total) by mouth daily.   cefdinir 300 MG capsule Commonly known as: OMNICEF Take 300 mg by mouth  2 (two) times daily.   furosemide 40 MG tablet Commonly known as: LASIX Take 1 tablet (40 mg total) by mouth daily.   losartan 50 MG tablet Commonly known as: COZAAR TAKE 1 TABLET BY MOUTH DAILY   pravastatin 40 MG tablet Commonly known as: PRAVACHOL TAKE 1 TABLET BY MOUTH DAILY   predniSONE 20 MG tablet Commonly known as: DELTASONE Take 40 mg by mouth daily.        History (reviewed): Past Medical History:  Diagnosis Date   CHF (congestive heart failure) ([facility]) 07/2013   ACUTE   Chronic congestive heart failure ([facility]) 08/14/2015   Chronic obstructive pulmonary disease  ([place]) 08/14/2015   COPD (chronic obstructive pulmonary disease) ([place])    Essential hypertension 08/14/2015   HTN (hypertension), malignant    HX OF   Prediabetes 08/14/2015   Respiratory failure ([facility])    Shortness of breath    History reviewed. No pertinent surgical history. Family History  Problem Relation Age of Onset   Diabetes Daughter    Hypertension Father    Stroke Brother    Heart attack Mother    Social History   Socioeconomic History   Marital status: Single    Spouse name: Not on file   Number of children: 1   Years of education: 14   Highest education level: Associate degree: academic program  Occupational History   Occupation: retired    Comment: Sales executive  Tobacco Use   Smoking status: Former    Packs/day: 1.00    Years: 51.00    Total pack years: 51.00    Types: Cigarettes    Start date: 07/15/1961    Quit date: 01/05/2022    Years since quitting: 0.3   Smokeless tobacco: Never  Vaping Use   Vaping Use: Never used  Substance and Sexual Activity   Alcohol use: No    Alcohol/week: 0.0 standard drinks of alcohol   Drug use: No   Sexual activity: Not Currently  Other Topics Concern   Not on file  Social History Narrative   Stays at home mostly. Lives with daughter. Sleeps when her daughter is at home. Does do house work. Enjoys doing crossword puzzles.   Social Determinants of Health   Financial Resource Strain: Low Risk  (05/08/2022)   Overall Financial Resource Strain (CARDIA)    Difficulty of Paying Living Expenses: Not hard at all  Food Insecurity: No Food Insecurity (05/08/2022)   Hunger Vital Sign    Worried About Running Out of Food in the Last Year: Never true    Ran Out of Food in the Last Year: Never true  Transportation Needs: No Transportation Needs (05/08/2022)   PRAPARE - Hydrologist (Medical): No    Lack of Transportation (Non-Medical): No  Physical Activity: Inactive (05/08/2022)   Exercise  Vital Sign    Days of Exercise per Week: 0 days    Minutes of Exercise per Session: 0 min  Stress: No Stress Concern Present (05/08/2022)   [place]    Feeling of Stress : Not at all  Social Connections: Socially Isolated (05/08/2022)   Social Connection and Isolation Panel [NHANES]    Frequency of Communication with Friends and Family: Twice a week    Frequency of Social Gatherings with Friends and Family: More than three times a week    Attends Religious Services: Never    Marine scientist or Organizations: No  Attends Banker Meetings: Never    Marital Status: Never married    Activities of Daily Living    05/08/2022    9:40 AM  In your present state of health, do you have any difficulty performing the following activities:  Hearing? 0  Vision? 0  Difficulty concentrating or making decisions? 0  Walking or climbing stairs? 0  Dressing or bathing? 0  Doing errands, shopping? 0  Preparing Food and eating ? N  Using the Toilet? N  In the past six months, have you accidently leaked urine? N  Do you have problems with loss of bowel control? N  Managing your Medications? N  Managing your Finances? N  Housekeeping or managing your Housekeeping? N    Patient Education/ Literacy How often do you need to have someone help you when you read instructions, pamphlets, or other written materials from your doctor or pharmacy?: 1 - Never What is the last grade level you completed in school?: 2 years of college  Exercise Current Exercise Habits: The patient does not participate in regular exercise at present, Exercise limited by: None identified  Diet Patient reports consuming 3 meals a day and 3-4 snack(s) a day Patient reports that her primary diet is: Regular Patient reports that she does have regular access to food.   Depression Screen    05/08/2022    9:34 AM 01/10/2022   10:55 AM  05/10/2021   10:53 AM 05/07/2021   10:47 AM 06/29/2019   10:06 AM 06/23/2018   10:06 AM 03/04/2018   11:32 AM  PHQ 2/9 Scores  PHQ - 2 Score 0 1 0 0 0 0 0  PHQ- 9 Score   1         Fall Risk    05/08/2022    9:34 AM 01/10/2022   10:55 AM 05/10/2021   10:53 AM 05/07/2021   10:47 AM 06/29/2019   10:05 AM  Fall Risk   Falls in the past year? 0 0 0 0 0  Number falls in past yr: 0 0 0 0 0  Injury with Fall? 0 0 0 0 0  Risk for fall due to : No Fall Risks No Fall Risks No Fall Risks No Fall Risks Impaired balance/gait  Follow up Falls evaluation completed Falls evaluation completed Falls evaluation completed Falls evaluation completed;Education provided;Falls prevention discussed Education provided     Objective:  Lissete Rooker seemed alert and oriented and she participated appropriately during our telephone visit.  Blood Pressure Weight BMI  BP Readings from Last 3 Encounters:  01/10/22 129/81  11/08/21 122/78  05/10/21 (!) 145/78   Wt Readings from Last 3 Encounters:  01/10/22 140 lb 0.6 oz (63.5 kg)  11/08/21 145 lb 9.6 oz (66 kg)  05/10/21 152 lb 12.8 oz (69.3 kg)   BMI Readings from Last 1 Encounters:  01/10/22 21.61 kg/m    *Unable to obtain current vital signs, weight, and BMI due to telephone visit type  Hearing/Vision  Midajah did not seem to have difficulty with hearing/understanding during the telephone conversation Reports that she has not had a formal eye exam by an eye care professional within the past year Reports that she has not had a formal hearing evaluation within the past year *Unable to fully assess hearing and vision during telephone visit type  Cognitive Function:    05/08/2022    9:42 AM 05/07/2021   10:59 AM 06/29/2019   10:12 AM 06/23/2018   10:12 AM  6CIT Screen  What Year? 0 points 0 points 4 points 0 points  What month? 0 points 0 points 0 points 0 points  What time? 0 points 0 points 0 points 0 points  Count back from 20 0 points 0  points 0 points 0 points  Months in reverse 0 points 0 points 0 points 0 points  Repeat phrase 0 points 0 points 0 points 0 points  Total Score 0 points 0 points 4 points 0 points   (Normal:0-7, Significant for Dysfunction: >8)  Normal Cognitive Function Screening: Yes   Immunization & Health Maintenance Record Immunization History  Administered Date(s) Administered   Fluad Quad(high Dose 65+) 05/10/2019, 05/10/2021   Influenza, High Dose Seasonal PF 05/14/2016, 05/12/2017   Influenza,inj,Quad PF,6+ Mos 07/25/2013, 06/21/2014, 06/02/2015, 03/04/2018   Influenza-Unspecified 07/05/2020   Moderna Sars-Covid-2 Vaccination 12/07/2019, 01/07/2020, 07/17/2020   Pneumococcal Conjugate-13 12/08/2015   Pneumococcal Polysaccharide-23 07/25/2013   Tdap 05/14/2016    Health Maintenance  Topic Date Due   Lung Cancer Screening  05/10/2022 (Originally 07/23/2014)   COVID-19 Vaccine (4 - Moderna risk series) 05/24/2022 (Originally 09/11/2020)   Zoster Vaccines- Shingrix (1 of 2) 08/08/2022 (Originally 05/09/1960)   INFLUENZA VACCINE  10/13/2022 (Originally 02/12/2022)   Hepatitis C Screening  05/09/2023 (Originally 05/09/1959)   Medicare Annual Wellness (AWV)  06/08/2023   TETANUS/TDAP  05/14/2026   Pneumonia Vaccine 76+ Years old  Completed   DEXA SCAN  Completed   HPV VACCINES  Aged Out       Assessment  This is a routine wellness examination for [employer].  Health Maintenance: Due or Overdue There are no preventive care reminders to display for this patient.   Terrance Lanahan does not need a referral for Community Assistance: Care Management:   no Social Work:    no Prescription Assistance:  no Nutrition/Diabetes Education:  no   Plan:  Personalized Goals  Goals Addressed               This Visit's Progress     Patient Stated (pt-stated)        05/08/2022 AWV Goal: Improved Nutrition/Diet  Patient will verbalize understanding that diet plays an important role in  overall health and that a poor diet is a risk factor for many chronic medical conditions.  Over the next year, patient will improve self management of their diet by incorporating more water. Patient will utilize available community resources to help with food acquisition if needed (ex: food pantries, [address], etc) Patient will work with nutrition specialist if a referral was made        Personalized Health Maintenance & Screening Recommendations  Influenza vaccine Shingles vaccine  Lung Cancer Screening Recommended: yes; but not application since patient turns 81 tomorrow. (Low Dose CT Chest recommended if Age 16-80 years, 30 pack-year currently smoking OR have quit w/in past 15 years) Hepatitis C Screening recommended: yes HIV Screening recommended: no  Advanced Directives: Written information was not prepared per patient's request.  Referrals & Orders No orders of the defined types were placed in this encounter.   Follow-up Plan Follow-up with Samuel Bouche, NP as planned Schedule your shingles vaccine at your pharmacy.  Influenza vaccine can be done at your visit with Joy on Thursday. Medicare wellness visit in one year.  Patient will access AVS on my chart.   I have personally reviewed and noted the following in the patient's chart:   Medical and social history Use of alcohol, tobacco or illicit drugs  Current medications and  supplements Functional ability and status Nutritional status Physical activity Advanced directives List of other physicians Hospitalizations, surgeries, and ER visits in previous 12 months Vitals Screenings to include cognitive, depression, and falls Referrals and appointments  In addition, I have reviewed and discussed with Margy Clarks certain preventive protocols, quality metrics, and best practice recommendations. A written personalized care plan for preventive services as well as general preventive health recommendations is available and can  be mailed to the patient at her request.      Tinnie Gens, RN BSN  05/08/2022

## 2022-05-09 DIAGNOSIS — J9601 Acute respiratory failure with hypoxia: Secondary | ICD-10-CM | POA: Diagnosis not present

## 2022-05-10 ENCOUNTER — Encounter: Payer: Self-pay | Admitting: Medical-Surgical

## 2022-05-10 ENCOUNTER — Ambulatory Visit (INDEPENDENT_AMBULATORY_CARE_PROVIDER_SITE_OTHER): Payer: Medicare Other | Admitting: Medical-Surgical

## 2022-05-10 VITALS — BP 125/67 | HR 74 | Ht 67.5 in | Wt 155.0 lb

## 2022-05-10 DIAGNOSIS — J42 Unspecified chronic bronchitis: Secondary | ICD-10-CM

## 2022-05-10 DIAGNOSIS — R7303 Prediabetes: Secondary | ICD-10-CM | POA: Diagnosis not present

## 2022-05-10 DIAGNOSIS — I10 Essential (primary) hypertension: Secondary | ICD-10-CM

## 2022-05-10 DIAGNOSIS — Z23 Encounter for immunization: Secondary | ICD-10-CM | POA: Diagnosis not present

## 2022-05-10 DIAGNOSIS — M7632 Iliotibial band syndrome, left leg: Secondary | ICD-10-CM

## 2022-05-10 DIAGNOSIS — I5032 Chronic diastolic (congestive) heart failure: Secondary | ICD-10-CM

## 2022-05-10 LAB — POCT GLYCOSYLATED HEMOGLOBIN (HGB A1C): HbA1c, POC (prediabetic range): 5.8 % (ref 5.7–6.4)

## 2022-05-10 NOTE — Progress Notes (Signed)
Established Patient Office Visit  Subjective   Patient ID: Angelica Mcbride, female   DOB: Jul 04, 1941 Age: 81 y.o. MRN: [id number]   Chief Complaint  Patient presents with   Diabetes   Follow-up    HPI Pleasant 81 year old female presenting today for the following:  Prediabetes: Does not check sugars or take medications at this point to help manage prediabetes.  She admits that she has been sneaking lately with Junior mints and has gained a little bit of weight.  Thinks that her A1c may have gotten worse rather than better.  Hypertension: Occasionally checks blood pressures at home and notes her readings are usually at the goal of 130/80 or lower.  Taking amlodipine 5 mg daily and losartan 50 mg daily as prescribed, tolerating well without side effects.  Following a low-sodium diet.  Activity as tolerated. Denies CP, SOB, palpitations, lower extremity edema, dizziness, headaches, or vision changes.  CHF: Taking furosemide 40 mg once daily, tolerating well without side effects.  Notes that she is feeling great with no shortness of breath, coughing, or lower extremity edema.  COPD: Stopped smoking in June after smoking for greater than 50 years.  She feels that her respiratory status has gotten much better and she no longer has shortness of breath with activity or difficulty lying flat at night.  Left leg pain: For the last several months has been experiencing a sharp, stabbing pain in the lateral left leg from the knee extending to the ankle.  Over the past few weeks this has become more frequent and is now happening at least once daily.  It seems to happen more when she is sitting.  She does have a history of low back pain however there is no pain in her thigh.   Objective:    Vitals:   05/10/22 1000 05/10/22 1008  BP: (!) 146/68 125/67  Pulse: 74   Height: 5' 7.5" (1.715 m)   Weight: 155 lb (70.3 kg)   SpO2: 92%   BMI (Calculated): 23.9     Physical Exam Vitals and nursing note  reviewed.  Constitutional:      General: She is not in acute distress.    Appearance: Normal appearance. She is normal weight. She is not ill-appearing.  HENT:     Head: Normocephalic and atraumatic.  Cardiovascular:     Rate and Rhythm: Normal rate and regular rhythm.     Pulses: Normal pulses.     Heart sounds: Normal heart sounds.  Pulmonary:     Effort: Pulmonary effort is normal. No respiratory distress.     Breath sounds: Normal breath sounds. No wheezing, rhonchi or rales.  Skin:    General: Skin is warm and dry.  Neurological:     Mental Status: She is alert and oriented to person, place, and time.  Psychiatric:        Mood and Affect: Mood normal.        Behavior: Behavior normal.        Thought Content: Thought content normal.        Judgment: Judgment normal.    Results for orders placed or performed in visit on 05/10/22 (from the past 24 hour(s))  POCT HgB A1C     Status: None   Collection Time: 05/10/22 10:14 AM  Result Value Ref Range   Hemoglobin A1C     HbA1c POC (<> result, manual entry)     HbA1c, POC (prediabetic range) 5.8 5.7 - 6.4 %   HbA1c, POC (  controlled diabetic range)         The ASCVD Risk score (Arnett DK, et al., 2019) failed to calculate for the following reasons:   The 2019 ASCVD risk score is only valid for ages 28 to 48   Assessment & Plan:   1. Prediabetes POCT hemoglobin A1c 5.8% today down from 6.2%.  Recommend continuing efforts to avoid simple carbohydrates and concentrated sweets. - POCT HgB A1C  2. Chronic diastolic congestive heart failure (HCC) Symptoms stable.  Continue furosemide 40 mg daily.  Low-sodium diet recommended.  3. Primary hypertension Blood pressure elevated on arrival however recheck was at goal.  Continue amlodipine 5 mg and losartan 50 mg daily as prescribed.  Monitor blood pressure at home 2-3 times weekly with a goal of 130/80 or less.  Low-sodium diet, maintenance of a healthy weight, and regular  intentional activity recommended.  4. Chronic bronchitis, unspecified chronic bronchitis type (HCC) Symptoms stable.  Stop smoking in June and has been able to stick with it.  Continue albuterol inhaler as needed.  5. It band syndrome, left Discomfort and the left IT band area.  Printed home exercises to complete.  If no improvement in a few weeks, may benefit from further evaluation.  6. Need for immunization against influenza Flu vaccine given in office today. - Flu Vaccine QUAD High Dose(Fluad)  Return in about 6 months (around 11/09/2022) for prediabetes/HTN/HLD follow up.  ___________________________________________ Clearnce Sorrel, DNP, APRN, FNP-BC Primary Care and [place]

## 2022-06-09 DIAGNOSIS — J9601 Acute respiratory failure with hypoxia: Secondary | ICD-10-CM | POA: Diagnosis not present

## 2022-06-30 ENCOUNTER — Other Ambulatory Visit: Payer: Self-pay | Admitting: Medical-Surgical

## 2022-07-05 ENCOUNTER — Other Ambulatory Visit: Payer: Self-pay | Admitting: Medical-Surgical

## 2022-07-05 DIAGNOSIS — I5032 Chronic diastolic (congestive) heart failure: Secondary | ICD-10-CM

## 2022-07-06 ENCOUNTER — Encounter: Payer: Self-pay | Admitting: Medical-Surgical

## 2022-07-09 DIAGNOSIS — J9601 Acute respiratory failure with hypoxia: Secondary | ICD-10-CM | POA: Diagnosis not present

## 2022-08-09 DIAGNOSIS — J9601 Acute respiratory failure with hypoxia: Secondary | ICD-10-CM | POA: Diagnosis not present

## 2022-08-28 ENCOUNTER — Other Ambulatory Visit: Payer: Self-pay | Admitting: Medical-Surgical

## 2022-08-29 ENCOUNTER — Other Ambulatory Visit: Payer: Self-pay | Admitting: Medical-Surgical

## 2022-09-09 DIAGNOSIS — J9601 Acute respiratory failure with hypoxia: Secondary | ICD-10-CM | POA: Diagnosis not present

## 2022-09-15 ENCOUNTER — Other Ambulatory Visit: Payer: Self-pay | Admitting: Medical-Surgical

## 2022-09-16 ENCOUNTER — Other Ambulatory Visit: Payer: Self-pay | Admitting: Medical-Surgical

## 2022-09-16 DIAGNOSIS — I5032 Chronic diastolic (congestive) heart failure: Secondary | ICD-10-CM

## 2022-10-08 DIAGNOSIS — J9601 Acute respiratory failure with hypoxia: Secondary | ICD-10-CM | POA: Diagnosis not present

## 2022-11-08 DIAGNOSIS — J9601 Acute respiratory failure with hypoxia: Secondary | ICD-10-CM | POA: Diagnosis not present

## 2022-11-11 ENCOUNTER — Ambulatory Visit: Payer: Medicare Other | Admitting: Medical-Surgical

## 2022-11-11 ENCOUNTER — Other Ambulatory Visit: Payer: Self-pay | Admitting: Medical-Surgical

## 2022-11-29 ENCOUNTER — Ambulatory Visit (INDEPENDENT_AMBULATORY_CARE_PROVIDER_SITE_OTHER): Payer: 59 | Admitting: Medical-Surgical

## 2022-11-29 ENCOUNTER — Ambulatory Visit (INDEPENDENT_AMBULATORY_CARE_PROVIDER_SITE_OTHER): Payer: Medicare Other

## 2022-11-29 ENCOUNTER — Encounter: Payer: Self-pay | Admitting: Medical-Surgical

## 2022-11-29 VITALS — BP 137/75 | HR 80 | Resp 20 | Ht 67.5 in | Wt 167.1 lb

## 2022-11-29 DIAGNOSIS — W19XXXA Unspecified fall, initial encounter: Secondary | ICD-10-CM | POA: Diagnosis not present

## 2022-11-29 DIAGNOSIS — R7303 Prediabetes: Secondary | ICD-10-CM | POA: Diagnosis not present

## 2022-11-29 DIAGNOSIS — Y92009 Unspecified place in unspecified non-institutional (private) residence as the place of occurrence of the external cause: Secondary | ICD-10-CM

## 2022-11-29 DIAGNOSIS — J42 Unspecified chronic bronchitis: Secondary | ICD-10-CM | POA: Diagnosis not present

## 2022-11-29 DIAGNOSIS — I10 Essential (primary) hypertension: Secondary | ICD-10-CM

## 2022-11-29 DIAGNOSIS — M79672 Pain in left foot: Secondary | ICD-10-CM | POA: Diagnosis not present

## 2022-11-29 DIAGNOSIS — I5032 Chronic diastolic (congestive) heart failure: Secondary | ICD-10-CM | POA: Diagnosis not present

## 2022-11-29 DIAGNOSIS — Z720 Tobacco use: Secondary | ICD-10-CM

## 2022-11-29 LAB — POCT GLYCOSYLATED HEMOGLOBIN (HGB A1C)
HbA1c, POC (controlled diabetic range): 6.2 % (ref 0.0–7.0)
Hemoglobin A1C: 6.2 % — AB (ref 4.0–5.6)

## 2022-11-29 NOTE — Progress Notes (Signed)
        Established patient visit  History, exam, impression, and plan:  1. Prediabetes Very pleasant 82 year old female presenting today to follow-up on prediabetes.  Last hemoglobin A1c checked in October 2023 at 5.8%.  Admits that she has room for improvement in her diet and has been eating sweets/carbohydrates more often.  POCT hemoglobin A1c 6.2% today.  Recommend working on a low carbohydrate diet and avoiding concentrated sweets whenever possible.  If indulging, only do so once or twice a week and in very small portions. - POCT HgB A1C  2. Primary hypertension Currently on amlodipine 5 mg daily, losartan 50 mg daily, and furosemide 40 mg daily.  Tolerating all medications well without side effects.  Denies CP, SOB, palpitations, LE edema, headache, dizziness, and abrupt vision changes.  HRR, S1/S2 normal.  Lungs CTA, respirations even and unlabored.  Mild edema of the left lower extremity (see below).  Blood pressure is at goal today.  Continue current medications.  Continue a low-sodium heart healthy diet and regular activity as tolerated. - CBC with Differential/Platelet - COMPLETE METABOLIC PANEL WITH GFR - Lipid panel  3. Fall in home, initial encounter A few weeks ago, was standing in the kitchen and tried to turn but her left leg did not move as it was expected to.  She had a significant fall and was unable to get out of the floor without assistance.  She did not seek medical care at that time.  Since then, she has been using a cane or walker with ambulation.  Notes that her balance is off due to pain in her left foot.  Discussed referral to physical therapy to help with balance and ambulation issues.  She would like to think on this and will let me know.  4. Chronic bronchitis, unspecified chronic bronchitis type (HCC) Refer out to report that she has continued smoking cessation for almost a year.  Does get dyspneic on exertion at times but otherwise does not have any difficulty with  shortness of breath at rest.  Using albuterol as needed with good response.  5. Chronic diastolic congestive heart failure (HCC) Has been doing well with furosemide 40 mg daily.  Mild edema related to left foot pain however otherwise in good fluid balance.  6. Left foot pain Notes that the pain is at the forefoot and between the third and fourth toes.  On exam, the left foot is mildly edematous with good pulses.  No abnormal temperature changes or discolorations  Pain reproducible with palpation of the space between the third and fourth metatarsals and with lateral compression at the ball of the foot and toes bilaterally.  Suspect this is related to either osteoarthritis or possibly a Morton's neuroma.  Okay to use ibuprofen 800 mg every 8 hours and Tylenol every 8 hours as needed.  Consider using heat or ice for comfort.  Getting x-rays of the left foot today.   - DG Foot Complete Left; Future  7. Tobacco abuse Greater than 20 pack years of cigarette smoking quit less than 1 year ago.  Referral placed for lung cancer screening. - Ambulatory Referral Lung Cancer Screening [facility]  Procedures performed this visit: None.  Return in about 6 months (around 06/01/2023) for chronic disease follow up.  __________________________________ Thayer Ohm, DNP, APRN, FNP-BC Primary Care and Sports Medicine [facility]

## 2022-11-30 ENCOUNTER — Other Ambulatory Visit: Payer: Self-pay | Admitting: Medical-Surgical

## 2022-11-30 LAB — CBC WITH DIFFERENTIAL/PLATELET
Absolute Monocytes: 480 cells/uL (ref 200–950)
Basophils Absolute: 18 cells/uL (ref 0–200)
Basophils Relative: 0.3 %
Eosinophils Absolute: 300 cells/uL (ref 15–500)
Eosinophils Relative: 5 %
HCT: 38.4 % (ref 35.0–45.0)
Hemoglobin: 12.9 g/dL (ref 11.7–15.5)
Lymphs Abs: 1356 cells/uL (ref 850–3900)
MCH: 30.9 pg (ref 27.0–33.0)
MCHC: 33.6 g/dL (ref 32.0–36.0)
MCV: 92.1 fL (ref 80.0–100.0)
MPV: 11.4 fL (ref 7.5–12.5)
Monocytes Relative: 8 %
Neutro Abs: 3846 cells/uL (ref 1500–7800)
Neutrophils Relative %: 64.1 %
Platelets: 207 10*3/uL (ref 140–400)
RBC: 4.17 10*6/uL (ref 3.80–5.10)
RDW: 16 % — ABNORMAL HIGH (ref 11.0–15.0)
Total Lymphocyte: 22.6 %
WBC: 6 10*3/uL (ref 3.8–10.8)

## 2022-11-30 LAB — COMPLETE METABOLIC PANEL WITH GFR
AG Ratio: 1.6 (calc) (ref 1.0–2.5)
ALT: 12 U/L (ref 6–29)
AST: 18 U/L (ref 10–35)
Albumin: 4.3 g/dL (ref 3.6–5.1)
Alkaline phosphatase (APISO): 74 U/L (ref 37–153)
BUN/Creatinine Ratio: 12 (calc) (ref 6–22)
BUN: 14 mg/dL (ref 7–25)
CO2: 26 mmol/L (ref 20–32)
Calcium: 8.5 mg/dL — ABNORMAL LOW (ref 8.6–10.4)
Chloride: 105 mmol/L (ref 98–110)
Creat: 1.13 mg/dL — ABNORMAL HIGH (ref 0.60–0.95)
Globulin: 2.7 g/dL (calc) (ref 1.9–3.7)
Glucose, Bld: 92 mg/dL (ref 65–99)
Potassium: 4.1 mmol/L (ref 3.5–5.3)
Sodium: 143 mmol/L (ref 135–146)
Total Bilirubin: 0.4 mg/dL (ref 0.2–1.2)
Total Protein: 7 g/dL (ref 6.1–8.1)
eGFR: 49 mL/min/{1.73_m2} — ABNORMAL LOW (ref >=60)

## 2022-11-30 LAB — LIPID PANEL
Cholesterol: 148 mg/dL (ref <200)
HDL: 46 mg/dL — ABNORMAL LOW (ref >=50)
LDL Cholesterol (Calc): 83 mg/dL (calc)
Non-HDL Cholesterol (Calc): 102 mg/dL (calc) (ref <130)
Total CHOL/HDL Ratio: 3.2 (calc) (ref <5.0)
Triglycerides: 97 mg/dL (ref <150)

## 2022-12-03 ENCOUNTER — Other Ambulatory Visit: Payer: Self-pay | Admitting: Medical-Surgical

## 2022-12-03 DIAGNOSIS — I5032 Chronic diastolic (congestive) heart failure: Secondary | ICD-10-CM

## 2022-12-04 ENCOUNTER — Encounter: Payer: Self-pay | Admitting: Medical-Surgical

## 2022-12-05 MED ORDER — TRAMADOL HCL 50 MG PO TABS: 50.0000 mg | ORAL_TABLET | Freq: Three times a day (TID) | ORAL | 0 refills | Status: DC | PRN

## 2022-12-06 ENCOUNTER — Ambulatory Visit (INDEPENDENT_AMBULATORY_CARE_PROVIDER_SITE_OTHER): Payer: Medicare Other | Admitting: Medical-Surgical

## 2022-12-06 VITALS — Ht 67.5 in | Wt 167.0 lb

## 2022-12-06 DIAGNOSIS — M84475A Pathological fracture, left foot, initial encounter for fracture: Secondary | ICD-10-CM

## 2022-12-06 NOTE — Progress Notes (Signed)
Agree with documentation as below.  ___________________________________________ Josiephine Simao L. Dreana Britz, DNP, APRN, FNP-BC Primary Care and Sports Medicine [facility]  

## 2022-12-06 NOTE — Progress Notes (Signed)
Pt presented for Post Op shoe fitting.   Per Imaging note: Multiple left metatarsal nondisplaced fractures. Recommend post op shoes for 8 weeks. Ok to have her come by the office to get this. Plan follow up with Dr. Karie Schwalbe in 4 weeks.

## 2022-12-08 DIAGNOSIS — J9601 Acute respiratory failure with hypoxia: Secondary | ICD-10-CM | POA: Diagnosis not present

## 2022-12-24 ENCOUNTER — Encounter: Payer: Self-pay | Admitting: Medical-Surgical

## 2022-12-25 MED ORDER — TRAMADOL HCL 50 MG PO TABS: 50.0000 mg | ORAL_TABLET | Freq: Three times a day (TID) | ORAL | 0 refills | Status: DC | PRN

## 2023-01-08 DIAGNOSIS — J9601 Acute respiratory failure with hypoxia: Secondary | ICD-10-CM | POA: Diagnosis not present

## 2023-01-31 ENCOUNTER — Ambulatory Visit (INDEPENDENT_AMBULATORY_CARE_PROVIDER_SITE_OTHER): Payer: 59 | Admitting: Sports Medicine

## 2023-01-31 DIAGNOSIS — S92302G Fracture of unspecified metatarsal bone(s), left foot, subsequent encounter for fracture with delayed healing: Secondary | ICD-10-CM | POA: Diagnosis not present

## 2023-01-31 DIAGNOSIS — S92302A Fracture of unspecified metatarsal bone(s), left foot, initial encounter for closed fracture: Secondary | ICD-10-CM | POA: Insufficient documentation

## 2023-01-31 MED ORDER — TRAMADOL HCL 50 MG PO TABS
50.0000 mg | ORAL_TABLET | Freq: Three times a day (TID) | ORAL | 0 refills | Status: DC | PRN
Start: 2023-01-31 — End: 2023-10-28

## 2023-01-31 MED ORDER — CALCIUM 600+D PLUS MINERALS 600-400 MG-UNIT PO TABS
ORAL_TABLET | ORAL | 3 refills | Status: DC
Start: 2023-01-31 — End: 2024-05-31

## 2023-01-31 NOTE — Assessment & Plan Note (Addendum)
Fell at home 2 mos ago. PCP noted MT fxs. In post op shoe x8 wks. Still painful over MT necks. CT to eval non-union. Adding Ca/Vit D. If mal/nonunion, will consider ABIs/Casting.  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  I would like a consultation with podiatry regarding her nonunion fifth metatarsal neck fracture.

## 2023-01-31 NOTE — Progress Notes (Addendum)
    Procedures performed today:    None.  Independent interpretation of notes and tests performed by another provider:   XR personally reviewed, subacute fx 3-5 MT necks  Brief History, Exam, Impression, and Recommendations:    Closed fx 3rd-5th MT necks Fell at home 2 mos ago. PCP noted MT fxs. In post op shoe x8 wks. Still painful over MT necks. CT to eval non-union. Adding Ca/Vit D. If mal/nonunion, will consider ABIs/Casting.  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  I would like a consultation with podiatry regarding her nonunion fifth metatarsal neck fracture.    ____________________________________________ Ihor Austin. Benjamin Stain, M.D., ABFM., CAQSM., AME. Primary Care and Sports Medicine [facility]  Adjunct Professor of Family Medicine  [employer]  Restaurant manager, [employer]

## 2023-02-01 ENCOUNTER — Other Ambulatory Visit: Payer: Self-pay | Admitting: Medical-Surgical

## 2023-02-01 DIAGNOSIS — M810 Age-related osteoporosis without current pathological fracture: Secondary | ICD-10-CM

## 2023-02-07 DIAGNOSIS — J9601 Acute respiratory failure with hypoxia: Secondary | ICD-10-CM | POA: Diagnosis not present

## 2023-02-12 ENCOUNTER — Ambulatory Visit (INDEPENDENT_AMBULATORY_CARE_PROVIDER_SITE_OTHER): Payer: 59

## 2023-02-12 DIAGNOSIS — S92352K Displaced fracture of fifth metatarsal bone, left foot, subsequent encounter for fracture with nonunion: Secondary | ICD-10-CM | POA: Diagnosis not present

## 2023-02-12 DIAGNOSIS — S92302G Fracture of unspecified metatarsal bone(s), left foot, subsequent encounter for fracture with delayed healing: Secondary | ICD-10-CM

## 2023-02-12 DIAGNOSIS — M85872 Other specified disorders of bone density and structure, left ankle and foot: Secondary | ICD-10-CM | POA: Diagnosis not present

## 2023-02-24 ENCOUNTER — Other Ambulatory Visit: Payer: Self-pay

## 2023-02-24 NOTE — Addendum Note (Signed)
Addended by: Monica Becton on: 02/24/2023 02:50 PM   Modules accepted: Orders

## 2023-03-10 DIAGNOSIS — J9601 Acute respiratory failure with hypoxia: Secondary | ICD-10-CM | POA: Diagnosis not present

## 2023-04-10 DIAGNOSIS — J9601 Acute respiratory failure with hypoxia: Secondary | ICD-10-CM | POA: Diagnosis not present

## 2023-05-10 DIAGNOSIS — J9601 Acute respiratory failure with hypoxia: Secondary | ICD-10-CM | POA: Diagnosis not present

## 2023-05-13 ENCOUNTER — Ambulatory Visit: Payer: 59 | Admitting: Medical-Surgical

## 2023-05-13 DIAGNOSIS — Z0000 Encounter for general adult medical examination without abnormal findings: Secondary | ICD-10-CM | POA: Diagnosis not present

## 2023-05-13 NOTE — Progress Notes (Signed)
MEDICARE ANNUAL WELLNESS VISIT  05/13/2023  Telephone Visit Disclaimer This Medicare AWV was conducted by telephone due to national recommendations for restrictions regarding the COVID-19 Pandemic (e.g. social distancing).  I verified, using two identifiers, that I am speaking with Angelica Mcbride or their authorized healthcare agent. I discussed the limitations, risks, security, and privacy concerns of performing an evaluation and management service by telephone and the potential availability of an in-person appointment in the future. The patient expressed understanding and agreed to proceed.  Location of Patient: Home Location of Provider (nurse):  In the office.   Subjective:    Angelica Mcbride is a 82 y.o. female patient of Christen Butter, NP who had a Medicare Annual Wellness Visit today via telephone. Lauron is Retired and lives with their daughter. she has 1 child. she reports that she is socially active and does interact with friends/family regularly. she is moderately physically active and enjoys doing crossword puzzles.  Patient Care Team: Christen Butter, NP as PCP - General (Nurse Practitioner) Berton Bon, MD     05/13/2023    9:38 AM 05/08/2022    9:33 AM 05/07/2021   10:48 AM 06/29/2019   10:04 AM 06/23/2018   10:02 AM 06/02/2015    1:41 PM 11/25/2014    4:09 PM  Advanced Directives  Does Patient Have a Medical Advance Directive? No No Yes No No No Yes  Type of Advance Directive   Living will;Healthcare Power of [employer] Power of Attorney  Does patient want to make changes to medical advance directive?   No - Patient declined      Copy of Healthcare Power of Attorney in Chart?   No - copy requested    No - copy requested  Would patient like information on creating a medical advance directive? No - Patient declined No - Patient declined  No - Patient declined Yes (MAU/Ambulatory/Procedural Areas - Information given) Yes - Educational materials given      Hospital Utilization Over the Past 12 Months: # of hospitalizations or ER visits: 0 # of surgeries: 0  Review of Systems    Patient reports that her overall health is unchanged compared to last year.  History obtained from chart review and the patient  Patient Reported Readings (BP, Pulse, CBG, Weight, etc) none Per patient no change in vitals since last visit, unable to obtain new vitals due to telehealth visit  Pain Assessment Pain : 0-10 Pain Score: 9  Pain Type: Chronic pain Pain Location: Foot Pain Orientation: Left, Right Pain Descriptors / Indicators: Aching Pain Onset: More than a month ago Pain Frequency: Intermittent Pain Relieving Factors: ibuprofen  Pain Relieving Factors: ibuprofen  Current Medications & Allergies (verified) Allergies as of 05/13/2023   No Known Allergies      Medication List        Accurate as of May 13, 2023  9:53 AM. If you have any questions, ask your nurse or doctor.          acetaminophen 500 MG tablet Commonly known as: TYLENOL Take 2 tablets (1,000 mg total) by mouth every 6 (six) hours as needed for mild pain or moderate pain.   albuterol 108 (90 Base) MCG/ACT inhaler Commonly known as: VENTOLIN HFA Inhale 2 puffs into the lungs every 4 (four) hours as needed for wheezing or shortness of breath.   alendronate 70 MG tablet Commonly known as: FOSAMAX TAKE 1 TABLET BY MOUTH WEEKLY  WITH 8 OZ OF PLAIN  WATER 30  MINUTES BEFORE FIRST FOOD, DRINK OR MEDS. STAY UPRIGHT FOR 30  MINS   amLODipine 5 MG tablet Commonly known as: NORVASC TAKE 1 TABLET BY MOUTH DAILY   aspirin 81 MG tablet Take 1 tablet (81 mg total) by mouth daily.   Calcium 600+D Plus Minerals 600-400 MG-UNIT Tabs 1 tab p.o. twice daily   furosemide 40 MG tablet Commonly known as: LASIX TAKE 1 TABLET BY MOUTH DAILY   losartan 50 MG tablet Commonly known as: COZAAR TAKE 1 TABLET BY MOUTH DAILY   pravastatin 40 MG tablet Commonly known as:  PRAVACHOL TAKE 1 TABLET BY MOUTH DAILY   traMADol 50 MG tablet Commonly known as: ULTRAM Take 1 tablet (50 mg total) by mouth every 8 (eight) hours as needed for moderate pain.        History (reviewed): Past Medical History:  Diagnosis Date   CHF (congestive heart failure) (HCC) 07/2013   ACUTE   Chronic congestive heart failure (HCC) 08/14/2015   Chronic obstructive pulmonary disease (HCC) 08/14/2015   COPD (chronic obstructive pulmonary disease) (HCC)    Essential hypertension 08/14/2015   HTN (hypertension), malignant    HX OF   Prediabetes 08/14/2015   Respiratory failure (HCC)    Shortness of breath    History reviewed. No pertinent surgical history. Family History  Problem Relation Age of Onset   Diabetes Daughter    Hypertension Father    Stroke Brother    Heart attack Mother    Social History   Socioeconomic History   Marital status: Single    Spouse name: Not on file   Number of children: 1   Years of education: 14   Highest education level: Some college, no degree  Occupational History   Occupation: retired    Comment: Corporate investment banker  Tobacco Use   Smoking status: Former    Current packs/day: 0.00    Average packs/day: 1 pack/day for 60.5 years (60.5 ttl pk-yrs)    Types: Cigarettes    Start date: 07/15/1961    Quit date: 01/05/2022    Years since quitting: 1.3   Smokeless tobacco: Never  Vaping Use   Vaping status: Never Used  Substance and Sexual Activity   Alcohol use: No    Alcohol/week: 0.0 standard drinks of alcohol   Drug use: No   Sexual activity: Not Currently  Other Topics Concern   Not on file  Social History Narrative   Stays at home mostly. Lives with daughter. Sleeps when her daughter is at home. Does do house work. Enjoys doing crossword puzzles.   Social Determinants of Health   Financial Resource Strain: Low Risk  (05/13/2023)   Overall Financial Resource Strain (CARDIA)    Difficulty of Paying Living Expenses: Not hard at  all  Food Insecurity: No Food Insecurity (05/13/2023)   Hunger Vital Sign    Worried About Running Out of Food in the Last Year: Never true    Ran Out of Food in the Last Year: Never true  Transportation Needs: No Transportation Needs (05/13/2023)   PRAPARE - Administrator, Civil Service (Medical): No    Lack of Transportation (Non-Medical): No  Physical Activity: Inactive (05/13/2023)   Exercise Vital Sign    Days of Exercise per Week: 0 days    Minutes of Exercise per Session: 0 min  Stress: No Stress Concern Present (05/13/2023)   [employer] of Occupational Health - Occupational Stress Questionnaire    Feeling of Stress :  Not at all  Social Connections: Socially Isolated (05/13/2023)   Social Connection and Isolation Panel [NHANES]    Frequency of Communication with Friends and Family: Once a week    Frequency of Social Gatherings with Friends and Family: More than three times a week    Attends Religious Services: Never    Database administrator or Organizations: No    Attends Banker Meetings: Never    Marital Status: Never married    Activities of Daily Living    05/13/2023    9:44 AM  In your present state of health, do you have any difficulty performing the following activities:  Hearing? 0  Vision? 0  Difficulty concentrating or making decisions? 0  Walking or climbing stairs? 1  Comment balance issues  Dressing or bathing? 0  Doing errands, shopping? 1  Comment usually goes with daughter  Quarry manager and eating ? N  Using the Toilet? N  In the past six months, have you accidently leaked urine? N  Do you have problems with loss of bowel control? N  Managing your Medications? N  Managing your Finances? Y  Comment her daughter usually manages her finances  Housekeeping or managing your Housekeeping? N    Patient Education/ Literacy How often do you need to have someone help you when you read instructions, pamphlets, or other  written materials from your doctor or pharmacy?: 1 - Never What is the last grade level you completed in school?: some college  Exercise    Diet Patient reports consuming 3 meals a day and 3-4 snack(s) a day Patient reports that her primary diet is: Regular Patient reports that she does have regular access to food.   Depression Screen    05/13/2023    9:39 AM 11/29/2022   11:12 AM 05/08/2022    9:34 AM 01/10/2022   10:55 AM 05/10/2021   10:53 AM 05/07/2021   10:47 AM 06/29/2019   10:06 AM  PHQ 2/9 Scores  PHQ - 2 Score 0 1 0 1 0 0 0  PHQ- 9 Score     1       Fall Risk    05/13/2023    9:39 AM 11/29/2022   11:11 AM 05/08/2022    9:34 AM 01/10/2022   10:55 AM 05/10/2021   10:53 AM  Fall Risk   Falls in the past year? 0 1 0 0 0  Number falls in past yr: 0 0 0 0 0  Injury with Fall? 0 1 0 0 0  Risk for fall due to : Impaired mobility;History of fall(s) History of fall(s) No Fall Risks No Fall Risks No Fall Risks  Follow up Falls evaluation completed Falls evaluation completed Falls evaluation completed Falls evaluation completed Falls evaluation completed     Objective:  Fanchon Dudeck seemed alert and oriented and she participated appropriately during our telephone visit.  Blood Pressure Weight BMI  BP Readings from Last 3 Encounters:  11/29/22 137/75  05/10/22 125/67  01/10/22 129/81   Wt Readings from Last 3 Encounters:  12/06/22 167 lb (75.8 kg)  11/29/22 167 lb 1.9 oz (75.8 kg)  05/10/22 155 lb (70.3 kg)   BMI Readings from Last 1 Encounters:  12/06/22 25.77 kg/m    *Unable to obtain current vital signs, weight, and BMI due to telephone visit type  Hearing/Vision  Ahrianna did not seem to have difficulty with hearing/understanding during the telephone conversation Reports that she has not had a formal eye exam  by an eye care professional within the past year Reports that she has not had a formal hearing evaluation within the past year *Unable to fully assess  hearing and vision during telephone visit type  Cognitive Function:    05/13/2023    9:50 AM 05/08/2022    9:42 AM 05/07/2021   10:59 AM 06/29/2019   10:12 AM 06/23/2018   10:12 AM  6CIT Screen  What Year? 0 points 0 points 0 points 4 points 0 points  What month? 0 points 0 points 0 points 0 points 0 points  What time? 0 points 0 points 0 points 0 points 0 points  Count back from 20 0 points 0 points 0 points 0 points 0 points  Months in reverse 0 points 0 points 0 points 0 points 0 points  Repeat phrase 0 points 0 points 0 points 0 points 0 points  Total Score 0 points 0 points 0 points 4 points 0 points   (Normal:0-7, Significant for Dysfunction: >8)  Normal Cognitive Function Screening: Yes   Immunization & Health Maintenance Record Immunization History  Administered Date(s) Administered   Fluad Quad(high Dose 65+) 05/10/2019, 05/10/2021, 05/10/2022   Influenza, High Dose Seasonal PF 05/14/2016, 05/12/2017   Influenza,inj,Quad PF,6+ Mos 07/25/2013, 06/21/2014, 06/02/2015, 03/04/2018   Influenza-Unspecified 07/05/2020   Moderna Sars-Covid-2 Vaccination 12/07/2019, 01/07/2020, 07/17/2020   Pneumococcal Conjugate-13 12/08/2015   Pneumococcal Polysaccharide-23 07/25/2013   Tdap 05/14/2016    Health Maintenance  Topic Date Due   COVID-19 Vaccine (4 - 2023-24 season) 05/29/2023 (Originally 03/16/2023)   Zoster Vaccines- Shingrix (1 of 2) 08/13/2023 (Originally 05/09/1960)   INFLUENZA VACCINE  10/13/2023 (Originally 02/13/2023)   DEXA SCAN  06/28/2023   Medicare Annual Wellness (AWV)  05/12/2024   DTaP/Tdap/Td (2 - Td or Tdap) 05/14/2026   Pneumonia Vaccine 66+ Years old  Completed   HPV VACCINES  Aged Out   Lung Cancer Screening  Discontinued       Assessment  This is a routine wellness examination for [employer].  Health Maintenance: Due or Overdue There are no preventive care reminders to display for this patient.   Ladonya Mahood does not need a referral for  [employer] Assistance: Care Management:   no Social Work:    no Prescription Assistance:  no Nutrition/Diabetes Education:  no   Plan:  Personalized Goals  Goals Addressed               This Visit's Progress     Patient Stated (pt-stated)        05/13/2023 AWV Goal: Tobacco Cessation  Smoking cessation instruction/counseling given:  commended patient for quitting and reviewed strategies for preventing relapses  Patient will verbalize understanding of the health risks associated with smoking/tobacco use Lung cancer or lung disease, such as COPD Heart disease. Stroke. Heart attack Infertility Osteoporosis and bone fractures. Patient will create a plan to quit smoking/using tobacco Pick a date to quit.  Write down the reasons why you are quitting and put it where you will see it often. Identify the people, places, things, and activities that make you want to smoke (triggers) and avoid them. Make sure to take these actions: Throw away all cigarettes at home, at work, and in your car. Throw away smoking accessories, such as Set designer. Clean your car and make sure to empty the ashtray. Clean your home, including curtains and carpets. Tell your family, friends, and coworkers that you are quitting. Support from your loved ones can make quitting easier. Talk  with your health care provider about your options for quitting smoking. Find out what treatment options are covered by your health insurance. Patient will be able to demonstrate knowledge of tobacco cessation strategies that may maximize success Quitting "cold [place]" is more successful than gradually quitting. Attending in-person counseling to help you build problem-solving skills.  Finding resources and support systems that can help you to quit smoking and remain smoke-free after you quit. These resources are most helpful when you use them often. They can include: Online chats with a Veterinary surgeon. Telephone  quitlines. Printed Materials engineer. Support groups or group counseling. Text messaging programs. Mobile phone applications. Taking medicines to help you quit smoking: Nicotine patches, gum, or lozenges. Nicotine inhalers or sprays. Non-nicotine medicine that is taken by mouth. Patient will note get discouraged if the process is difficult Over the next year, patient will stop smoking or using other forms of tobacco  Smoking cessation instruction/counseling given:  commended patient for quitting and reviewed strategies for preventing relapses         Personalized Health Maintenance & Screening Recommendations  Influenza vaccine Shingles vaccine  Lung Cancer Screening Recommended: no (Low Dose CT Chest recommended if Age 36-80 years, 20 pack-year currently smoking OR have quit w/in past 15 years) Hepatitis C Screening recommended: no HIV Screening recommended: no  Advanced Directives: Written information was not prepared per patient's request.  Referrals & Orders No orders of the defined types were placed in this encounter.   Follow-up Plan Follow-up with Christen Butter, NP as planned Schedule shingles vaccine at the pharmacy.  Medicare wellness visit in one year.  AVS printed and mailed to the patient.    I have personally reviewed and noted the following in the patient's chart:   Medical and social history Use of alcohol, tobacco or illicit drugs  Current medications and supplements Functional ability and status Nutritional status Physical activity Advanced directives List of other physicians Hospitalizations, surgeries, and ER visits in previous 12 months Vitals Screenings to include cognitive, depression, and falls Referrals and appointments  In addition, I have reviewed and discussed with Angelica Mcbride certain preventive protocols, quality metrics, and best practice recommendations. A written personalized care plan for preventive services as well as general  preventive health recommendations is available and can be mailed to the patient at her request.      Modesto Charon, RN BSN  05/13/2023

## 2023-05-13 NOTE — Patient Instructions (Signed)
MEDICARE ANNUAL WELLNESS VISIT Health Maintenance Summary and Written Plan of Care  Angelica Mcbride ,  Thank you for allowing me to perform your Medicare Annual Wellness Visit and for your ongoing commitment to your health.   Health Maintenance & Immunization History Health Maintenance  Topic Date Due   COVID-19 Vaccine (4 - 2023-24 season) 05/29/2023 (Originally 03/16/2023)   Zoster Vaccines- Shingrix (1 of 2) 08/13/2023 (Originally 05/09/1960)   INFLUENZA VACCINE  10/13/2023 (Originally 02/13/2023)   DEXA SCAN  06/28/2023   Medicare Annual Wellness (AWV)  05/12/2024   DTaP/Tdap/Td (2 - Td or Tdap) 05/14/2026   Pneumonia Vaccine 51+ Years old  Completed   HPV VACCINES  Aged Out   Lung Cancer Screening  Discontinued   Immunization History  Administered Date(s) Administered   Fluad Quad(high Dose 65+) 05/10/2019, 05/10/2021, 05/10/2022   Influenza, High Dose Seasonal PF 05/14/2016, 05/12/2017   Influenza,inj,Quad PF,6+ Mos 07/25/2013, 06/21/2014, 06/02/2015, 03/04/2018   Influenza-Unspecified 07/05/2020   Moderna Sars-Covid-2 Vaccination 12/07/2019, 01/07/2020, 07/17/2020   Pneumococcal Conjugate-13 12/08/2015   Pneumococcal Polysaccharide-23 07/25/2013   Tdap 05/14/2016    These are the patient goals that we discussed:  Goals Addressed               This Visit's Progress     Patient Stated (pt-stated)        05/13/2023 AWV Goal: Tobacco Cessation  Smoking cessation instruction/counseling given:  commended patient for quitting and reviewed strategies for preventing relapses  Patient will verbalize understanding of the health risks associated with smoking/tobacco use Lung cancer or lung disease, such as COPD Heart disease. Stroke. Heart attack Infertility Osteoporosis and bone fractures. Patient will create a plan to quit smoking/using tobacco Pick a date to quit.  Write down the reasons why you are quitting and put it where you will see it often. Identify the  people, places, things, and activities that make you want to smoke (triggers) and avoid them. Make sure to take these actions: Throw away all cigarettes at home, at work, and in your car. Throw away smoking accessories, such as Set designer. Clean your car and make sure to empty the ashtray. Clean your home, including curtains and carpets. Tell your family, friends, and coworkers that you are quitting. Support from your loved ones can make quitting easier. Talk with your health care provider about your options for quitting smoking. Find out what treatment options are covered by your health insurance. Patient will be able to demonstrate knowledge of tobacco cessation strategies that may maximize success Quitting "cold [place]" is more successful than gradually quitting. Attending in-person counseling to help you build problem-solving skills.  Finding resources and support systems that can help you to quit smoking and remain smoke-free after you quit. These resources are most helpful when you use them often. They can include: Online chats with a Veterinary surgeon. Telephone quitlines. Printed Materials engineer. Support groups or group counseling. Text messaging programs. Mobile phone applications. Taking medicines to help you quit smoking: Nicotine patches, gum, or lozenges. Nicotine inhalers or sprays. Non-nicotine medicine that is taken by mouth. Patient will note get discouraged if the process is difficult Over the next year, patient will stop smoking or using other forms of tobacco  Smoking cessation instruction/counseling given:  commended patient for quitting and reviewed strategies for preventing relapses           This is a list of Health Maintenance Items that are overdue or due now: Influenza vaccine Shingles vaccine  Orders/Referrals Placed  Today: No orders of the defined types were placed in this encounter.  (Contact our referral department at [phone] if you  have not spoken with someone about your referral appointment within the next 5 days)    Follow-up Plan Follow-up with Christen Butter, NP as planned Schedule shingles vaccine at the pharmacy.  Medicare wellness visit in one year.  AVS printed and mailed to the patient.       Health Maintenance, Female Adopting a healthy lifestyle and getting preventive care are important in promoting health and wellness. Ask your health care provider about: The right schedule for you to have regular tests and exams. Things you can do on your own to prevent diseases and keep yourself healthy. What should I know about diet, weight, and exercise? Eat a healthy diet  Eat a diet that includes plenty of vegetables, fruits, low-fat dairy products, and lean protein. Do not eat a lot of foods that are high in solid fats, added sugars, or sodium. Maintain a healthy weight Body mass index (BMI) is used to identify weight problems. It estimates body fat based on height and weight. Your health care provider can help determine your BMI and help you achieve or maintain a healthy weight. Get regular exercise Get regular exercise. This is one of the most important things you can do for your health. Most adults should: Exercise for at least 150 minutes each week. The exercise should increase your heart rate and make you sweat (moderate-intensity exercise). Do strengthening exercises at least twice a week. This is in addition to the moderate-intensity exercise. Spend less time sitting. Even light physical activity can be beneficial. Watch cholesterol and blood lipids Have your blood tested for lipids and cholesterol at 81 years of age, then have this test every 5 years. Have your cholesterol levels checked more often if: Your lipid or cholesterol levels are high. You are older than 81 years of age. You are at high risk for heart disease. What should I know about cancer screening? Depending on your health history and  family history, you may need to have cancer screening at various ages. This may include screening for: Breast cancer. Cervical cancer. Colorectal cancer. Skin cancer. Lung cancer. What should I know about heart disease, diabetes, and high blood pressure? Blood pressure and heart disease High blood pressure causes heart disease and increases the risk of stroke. This is more likely to develop in people who have high blood pressure readings or are overweight. Have your blood pressure checked: Every 3-5 years if you are 5-45 years of age. Every year if you are 60 years old or older. Diabetes Have regular diabetes screenings. This checks your fasting blood sugar level. Have the screening done: Once every three years after age 77 if you are at a normal weight and have a low risk for diabetes. More often and at a younger age if you are overweight or have a high risk for diabetes. What should I know about preventing infection? Hepatitis B If you have a higher risk for hepatitis B, you should be screened for this virus. Talk with your health care provider to find out if you are at risk for hepatitis B infection. Hepatitis C Testing is recommended for: Everyone born from [birth year] through 1964. Anyone with known risk factors for hepatitis C. Sexually transmitted infections (STIs) Get screened for STIs, including gonorrhea and chlamydia, if: You are sexually active and are younger than 81 years of age. You are older than 81 years of  age and your health care provider tells you that you are at risk for this type of infection. Your sexual activity has changed since you were last screened, and you are at increased risk for chlamydia or gonorrhea. Ask your health care provider if you are at risk. Ask your health care provider about whether you are at high risk for HIV. Your health care provider may recommend a prescription medicine to help prevent HIV infection. If you choose to take medicine to prevent HIV,  you should first get tested for HIV. You should then be tested every 3 months for as long as you are taking the medicine. Pregnancy If you are about to stop having your period (premenopausal) and you may become pregnant, seek counseling before you get pregnant. Take 400 to 800 micrograms (mcg) of folic acid every day if you become pregnant. Ask for birth control (contraception) if you want to prevent pregnancy. Osteoporosis and menopause Osteoporosis is a disease in which the bones lose minerals and strength with aging. This can result in bone fractures. If you are 62 years old or older, or if you are at risk for osteoporosis and fractures, ask your health care provider if you should: Be screened for bone loss. Take a calcium or vitamin D supplement to lower your risk of fractures. Be given hormone replacement therapy (HRT) to treat symptoms of menopause. Follow these instructions at home: Alcohol use Do not drink alcohol if: Your health care provider tells you not to drink. You are pregnant, may be pregnant, or are planning to become pregnant. If you drink alcohol: Limit how much you have to: 0-1 drink a day. Know how much alcohol is in your drink. In the [place] one drink equals one 12 oz bottle of beer (355 mL), one 5 oz glass of wine (148 mL), or one 1 oz glass of hard liquor (44 mL). Lifestyle Do not use any products that contain nicotine or tobacco. These products include cigarettes, chewing tobacco, and vaping devices, such as e-cigarettes. If you need help quitting, ask your health care provider. Do not use street drugs. Do not share needles. Ask your health care provider for help if you need support or information about quitting drugs. General instructions Schedule regular health, dental, and eye exams. Stay current with your vaccines. Tell your health care provider if: You often feel depressed. You have ever been abused or do not feel safe at home. Summary Adopting a healthy  lifestyle and getting preventive care are important in promoting health and wellness. Follow your health care provider's instructions about healthy diet, exercising, and getting tested or screened for diseases. Follow your health care provider's instructions on monitoring your cholesterol and blood pressure. This information is not intended to replace advice given to you by your health care provider. Make sure you discuss any questions you have with your health care provider. Document Revised: 11/20/2020 Document Reviewed: 11/20/2020 Elsevier Patient Education  2024 [employer].

## 2023-06-03 ENCOUNTER — Ambulatory Visit: Payer: 59 | Admitting: Medical-Surgical

## 2023-06-04 ENCOUNTER — Ambulatory Visit (INDEPENDENT_AMBULATORY_CARE_PROVIDER_SITE_OTHER): Payer: 59 | Admitting: Medical-Surgical

## 2023-06-04 ENCOUNTER — Encounter: Payer: Self-pay | Admitting: Medical-Surgical

## 2023-06-04 VITALS — BP 167/76 | HR 74 | Resp 20 | Ht 67.5 in | Wt 156.1 lb

## 2023-06-04 DIAGNOSIS — Z23 Encounter for immunization: Secondary | ICD-10-CM | POA: Diagnosis not present

## 2023-06-04 DIAGNOSIS — R7303 Prediabetes: Secondary | ICD-10-CM

## 2023-06-04 DIAGNOSIS — I10 Essential (primary) hypertension: Secondary | ICD-10-CM

## 2023-06-04 DIAGNOSIS — J42 Unspecified chronic bronchitis: Secondary | ICD-10-CM

## 2023-06-04 LAB — POCT GLYCOSYLATED HEMOGLOBIN (HGB A1C)
HbA1c, POC (controlled diabetic range): 6.4 % (ref 0.0–7.0)
Hemoglobin A1C: 6.4 % — AB (ref 4.0–5.6)
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
## 2023-06-05 LAB — CMP14+EGFR
ALT: 9 [IU]/L (ref 0–32)
AST: 18 [IU]/L (ref 0–40)
Albumin: 4.1 g/dL (ref 3.7–4.7)
Alkaline Phosphatase: 65 [IU]/L (ref 44–121)
BUN/Creatinine Ratio: 13 (ref 12–28)
BUN: 12 mg/dL (ref 8–27)
Bilirubin Total: 0.3 mg/dL (ref 0.0–1.2)
CO2: 27 mmol/L (ref 20–29)
Calcium: 9.1 mg/dL (ref 8.7–10.3)
Chloride: 107 mmol/L — ABNORMAL HIGH (ref 96–106)
Creatinine, Ser: 0.91 mg/dL (ref 0.57–1.00)
Globulin, Total: 2.2 g/dL (ref 1.5–4.5)
Glucose: 92 mg/dL (ref 70–99)
Potassium: 4 mmol/L (ref 3.5–5.2)
Sodium: 147 mmol/L — ABNORMAL HIGH (ref 134–144)
Total Protein: 6.3 g/dL (ref 6.0–8.5)
eGFR: 63 mL/min/{1.73_m2} (ref >59)

## 2023-06-10 DIAGNOSIS — J9601 Acute respiratory failure with hypoxia: Secondary | ICD-10-CM | POA: Diagnosis not present

## 2023-07-10 DIAGNOSIS — J9601 Acute respiratory failure with hypoxia: Secondary | ICD-10-CM | POA: Diagnosis not present

## 2023-07-11 ENCOUNTER — Other Ambulatory Visit: Payer: Self-pay | Admitting: Medical-Surgical

## 2023-07-11 DIAGNOSIS — M810 Age-related osteoporosis without current pathological fracture: Secondary | ICD-10-CM

## 2023-08-10 DIAGNOSIS — J9601 Acute respiratory failure with hypoxia: Secondary | ICD-10-CM | POA: Diagnosis not present

## 2023-08-12 ENCOUNTER — Other Ambulatory Visit: Payer: Self-pay | Admitting: Medical-Surgical

## 2023-08-26 ENCOUNTER — Other Ambulatory Visit: Payer: Self-pay | Admitting: Medical-Surgical

## 2023-08-26 DIAGNOSIS — I5032 Chronic diastolic (congestive) heart failure: Secondary | ICD-10-CM

## 2023-09-10 DIAGNOSIS — J9601 Acute respiratory failure with hypoxia: Secondary | ICD-10-CM | POA: Diagnosis not present

## 2023-10-04 DIAGNOSIS — R21 Rash and other nonspecific skin eruption: Secondary | ICD-10-CM | POA: Diagnosis not present

## 2023-10-04 DIAGNOSIS — B029 Zoster without complications: Secondary | ICD-10-CM | POA: Diagnosis not present

## 2023-10-08 ENCOUNTER — Other Ambulatory Visit: Payer: Self-pay | Admitting: Medical-Surgical

## 2023-10-08 DIAGNOSIS — J9601 Acute respiratory failure with hypoxia: Secondary | ICD-10-CM | POA: Diagnosis not present

## 2023-10-09 ENCOUNTER — Other Ambulatory Visit: Payer: Self-pay | Admitting: Medical-Surgical

## 2023-10-13 DIAGNOSIS — B028 Zoster with other complications: Secondary | ICD-10-CM | POA: Diagnosis not present

## 2023-10-14 ENCOUNTER — Telehealth: Admitting: Family Medicine

## 2023-10-14 DIAGNOSIS — B0229 Other postherpetic nervous system involvement: Secondary | ICD-10-CM

## 2023-10-14 MED ORDER — GABAPENTIN 100 MG PO CAPS: 100.0000 mg | ORAL_CAPSULE | Freq: Two times a day (BID) | ORAL | 0 refills | Status: DC

## 2023-10-14 NOTE — Patient Instructions (Addendum)
 Faye Ramsay, thank you for joining Freddy Finner, NP for today's virtual visit.  While this provider is not your primary care provider (PCP), if your PCP is located in our provider database this encounter information will be shared with them immediately following your visit.   A [facility] MyChart account gives you access to today's visit and all your visits, tests, and labs performed at [facility] " click here if you don't have a [facility] MyChart account or go to [email or web address]  Consent: (Patient) Angelica Mcbride provided verbal consent for this virtual visit at the beginning of the encounter.  Current Medications:  Current Outpatient Medications:    gabapentin (NEURONTIN) 100 MG capsule, Take 1 capsule (100 mg total) by mouth 2 (two) times daily., Disp: 30 capsule, Rfl: 0   acetaminophen (TYLENOL) 500 MG tablet, Take 2 tablets (1,000 mg total) by mouth every 6 (six) hours as needed for mild pain or moderate pain., Disp: 30 tablet, Rfl:    albuterol (VENTOLIN HFA) 108 (90 Base) MCG/ACT inhaler, Inhale 2 puffs into the lungs every 4 (four) hours as needed for wheezing or shortness of breath., Disp: 18 g, Rfl: 99   alendronate (FOSAMAX) 70 MG tablet, TAKE 1 TABLET BY MOUTH WEEKLY  WITH 8 OZ OF PLAIN WATER 30  MINUTES BEFORE FIRST FOOD, DRINK OR MEDS. STAY UPRIGHT FOR 30  MINS, Disp: 12 tablet, Rfl: 1   amLODipine (NORVASC) 5 MG tablet, TAKE 1 TABLET BY MOUTH DAILY, Disp: 90 tablet, Rfl: 0   aspirin 81 MG tablet, Take 1 tablet (81 mg total) by mouth daily., Disp: 90 tablet, Rfl: 1   Calcium Carbonate-Vit D-Min (CALCIUM 600+D PLUS MINERALS) 600-400 MG-UNIT TABS, 1 tab p.o. twice daily, Disp: 180 tablet, Rfl: 3   furosemide (LASIX) 40 MG tablet, TAKE 1 TABLET BY MOUTH DAILY, Disp: 100 tablet, Rfl: 2   losartan (COZAAR) 50 MG tablet, TAKE 1 TABLET BY MOUTH DAILY, Disp: 90 tablet, Rfl: 0   pravastatin (PRAVACHOL) 40 MG tablet, TAKE 1 TABLET BY MOUTH DAILY, Disp: 90 tablet,  Rfl: 0   traMADol (ULTRAM) 50 MG tablet, Take 1 tablet (50 mg total) by mouth every 8 (eight) hours as needed for moderate pain., Disp: 30 tablet, Rfl: 0   Medications ordered in this encounter:  Meds ordered this encounter  Medications   gabapentin (NEURONTIN) 100 MG capsule    Sig: Take 1 capsule (100 mg total) by mouth 2 (two) times daily.    Dispense:  30 capsule    Refill:  0    Supervising Provider:   Merrilee Jansky [[id number]]     *If you need refills on other medications prior to your next appointment, please contact your pharmacy*  Follow-Up: Call back or seek an in-person evaluation if the symptoms worsen or if the condition fails to improve as anticipated.  [facility] [phone]  Other Instructions  Nerve Pain After Shingles Postherpetic neuralgia (PHN) is nerve pain you may get after you have shingles. Shingles is an infection that causes a painful rash and blisters. It's caused by the same germ that causes chickenpox. PHN affects the spot on your body where you had the shingles rash. It can last for 3 months after your rash has gone away. What are the causes? PHN may be caused by damage to your nerves. This damage may come from swelling from the shingles infection. What increases the risk? You may be more likely to get PHN if: You're older than  82 years of age. You have severe pain before your rash starts. You have a very bad rash. You have shingles in and around your eye. Your body defense system (immune system) is weak. What are the signs or symptoms? The main symptom of PHN is pain. The pain may: Be stabbing, burning, or shooting. Feel like an electric shock. Come and go, or it may be there all the time. Get worse if: Something touches your skin. The temperature goes up or down. You may also have itching. How is this diagnosed? PHN may be diagnosed based on: Your symptoms. Whether you've had shingles before. How is this  treated? There's no cure, but treatment can help with the pain. Normal pain medicines may not help. You may need to work with an expert in treating pain to find what works best for you. Treatment may include: Anti-seizure medicines. Antidepressants. Strong pain medicines. A numbing patch worn on the skin. Shots of: Numbing medicines. Medicines to treat inflammation. Botulinum toxin. This can block pain signals and stop you from feeling pain. Follow these instructions at home: Medicines Take over-the-counter and prescription medicines only as told by your health care provider. Ask your provider if the medicine prescribed to you: Requires you to avoid driving or using machinery. Can cause trouble pooping or constipation. You may need to take these actions to prevent or treat trouble pooping: Drink enough fluid to keep your pee (urine) pale yellow. Take over-the-counter or prescription medicines. Eat foods that are high in fiber, such as beans, whole grains, and fresh fruits and vegetables. Limit foods that are high in fat and processed sugars, such as fried or sweet foods. Managing pain  If told, put ice on the painful area. Put ice in a plastic bag. Place a towel between your skin and the bag. Leave the ice on for 20 minutes, 2-3 times a day. If your skin turns bright red, remove the ice right away to prevent skin damage. The risk of damage is higher if you can't feel pain, heat, or cold. Cover sensitive spots with a bandage, or dressing, to stop clothes from rubbing. Wear loose clothes. General instructions It may take a long time for you to get better. Work closely with your provider. Think about talking with a mental health care provider. They can help you find ways to cope with feeling overwhelmed or hopeless. Have a good support system at home. Think about joining a pain support group. How is this prevented? Vaccines are the best way to prevent shingles and PHN. You should get  the vaccine shot for shingles once you're older than 82 years of age. Talk with your provider about getting the shot. Contact a health care provider if: Your medicine isn't helping. You can't manage your pain at home. You feel sad or depressed. Get help right away if: You have thoughts about hurting yourself or others. Get help right away if you feel like you may hurt yourself or others, or have thoughts about taking your own life. Go to your nearest emergency room or: Call 911. Call the National Suicide Prevention Lifeline at 870-233-6274 or 988. This is open 24 hours a day. Text the Crisis Text Line at (952)359-7734. This information is not intended to replace advice given to you by your health care provider. Make sure you discuss any questions you have with your health care provider. Document Revised: 10/03/2022 Document Reviewed: 10/03/2022 Elsevier Patient Education  2024 [employer] Inc.   If you have been instructed to  have an in-person evaluation today at a local Urgent Care facility, please use the link below. It will take you to a list of all of our available [facility], including address, phone number and hours of operation. Please do not delay care.  [facility]  If you or a family member do not have a primary care provider, use the link below to schedule a visit and establish care. When you choose a [facility] primary care physician or advanced practice provider, you gain a long-term partner in health. Find a Primary Care Provider  Learn more about [facility]'s in-office and virtual care options: [facility] - Get Care Now

## 2023-10-14 NOTE — Progress Notes (Signed)
 Virtual Visit Consent   Angelica Mcbride, you are scheduled for a virtual visit with a [facility] provider today. Just as with appointments in the office, your consent must be obtained to participate. Your consent will be active for this visit and any virtual visit you may have with one of our providers in the next 365 days. If you have a MyChart account, a copy of this consent can be sent to you electronically.  As this is a virtual visit, video technology does not allow for your provider to perform a traditional examination. This may limit your provider's ability to fully assess your condition. If your provider identifies any concerns that need to be evaluated in person or the need to arrange testing (such as labs, EKG, etc.), we will make arrangements to do so. Although advances in technology are sophisticated, we cannot ensure that it will always work on either your end or our end. If the connection with a video visit is poor, the visit may have to be switched to a telephone visit. With either a video or telephone visit, we are not always able to ensure that we have a secure connection.  By engaging in this virtual visit, you consent to the provision of healthcare and authorize for your insurance to be billed (if applicable) for the services provided during this visit. Depending on your insurance coverage, you may receive a charge related to this service.  I need to obtain your verbal consent now. Are you willing to proceed with your visit today? Ailin Rochford has provided verbal consent on 10/14/2023 for a virtual visit (video or telephone). Freddy Finner, NP  Date: 10/14/2023 3:04 PM   Virtual Visit via Video Note   I, Freddy Finner, connected with  Aija Scarfo  ([id number], [birth year]) on 10/14/23 at  3:00 PM EDT by a video-enabled telemedicine application and verified that I am speaking with the correct person using two identifiers.  Location: Patient: Virtual Visit Location Patient:  Home Provider: Virtual Visit Location Provider: Home Office   I discussed the limitations of evaluation and management by telemedicine and the availability of in person appointments. The patient expressed understanding and agreed to proceed.    History of Present Illness: Angelica Mcbride is a 83 y.o. who identifies as a female who was assigned female at birth, and is being seen today for post shingles pain.  Having post herpatigc pain after shingles treatment that started on 10/04/23. Was treated at a UC via [facility].  Rash has improved- still having some of it and itching on the flank. Denies fevers, blistering, drainage, or other symptoms No CP, SHOB. Completed her antiviral (Valtrex) and also had Kenalog cream for itching. Was not provided with gabapentin.    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 STAY UPRIGHT FOR 30  MINS, Disp: 12 tablet, Rfl: 1   amLODipine (NORVASC)  5 MG tablet, TAKE 1 TABLET BY MOUTH DAILY, Disp: 90 tablet, Rfl: 0   aspirin 81 MG tablet, Take 1 tablet (81 mg total) by mouth daily., Disp: 90 tablet, Rfl: 1   Calcium Carbonate-Vit D-Min (CALCIUM 600+D PLUS MINERALS) 600-400 MG-UNIT TABS, 1 tab p.o. twice daily, Disp: 180 tablet, Rfl: 3   furosemide (LASIX) 40 MG tablet, TAKE 1 TABLET BY MOUTH DAILY, Disp: 100 tablet, Rfl: 2   losartan (COZAAR) 50 MG tablet, TAKE 1 TABLET BY MOUTH DAILY, Disp: 90 tablet, Rfl: 0   pravastatin (PRAVACHOL) 40 MG tablet, TAKE 1 TABLET BY MOUTH DAILY, Disp: 90 tablet, Rfl: 0   traMADol (ULTRAM) 50 MG tablet, Take 1 tablet (50 mg total) by mouth every 8 (eight) hours as needed for moderate pain., Disp: 30 tablet, Rfl: 0  Observations/Objective: Patient is well-developed, well-nourished in no acute distress.  Resting comfortably  at home.  Head is normocephalic, atraumatic.  No labored breathing.  Speech is clear and coherent with logical content.  Patient is alert and oriented at baseline.    Assessment and Plan:  1. Post herpetic neuralgia (Primary)  - gabapentin (NEURONTIN) 100 MG capsule; Take 1 capsule (100 mg total) by mouth 2 (two) times daily.  Dispense: 30 capsule; Refill: 0  -take medication as discussed, continue to use cream as needed -follow up if not improving or temp occurs   Reviewed side effects, risks and benefits of medication.    Patient acknowledged agreement and understanding of the plan.   Past Medical, Surgical, Social History, Allergies, and Medications have been Reviewed.    Follow Up Instructions: I discussed the assessment and treatment plan with the patient. The patient was provided an opportunity to ask questions and all were answered. The patient agreed with the plan and demonstrated an understanding of the instructions.  A copy of instructions were sent to the patient via MyChart unless otherwise noted below.    The patient was advised to call back or seek an  in-person evaluation if the symptoms worsen or if the condition fails to improve as anticipated.    Freddy Finner, NP

## 2023-10-28 ENCOUNTER — Telehealth (INDEPENDENT_AMBULATORY_CARE_PROVIDER_SITE_OTHER): Admitting: Medical-Surgical

## 2023-10-28 ENCOUNTER — Encounter: Payer: Self-pay | Admitting: Medical-Surgical

## 2023-10-28 DIAGNOSIS — B0229 Other postherpetic nervous system involvement: Secondary | ICD-10-CM

## 2023-10-28 MED ORDER — METHYLPREDNISOLONE 4 MG PO TBPK: ORAL_TABLET | ORAL | 0 refills | Status: DC

## 2023-10-28 MED ORDER — LIDOCAINE 5 % EX PTCH: 1.0000 | MEDICATED_PATCH | CUTANEOUS | 0 refills | Status: DC

## 2023-10-28 MED ORDER — GABAPENTIN 100 MG PO CAPS: 300.0000 mg | ORAL_CAPSULE | Freq: Two times a day (BID) | ORAL | 1 refills | Status: DC

## 2023-10-28 NOTE — Progress Notes (Signed)
 Virtual Visit via Video Note  I connected with Kemaya Dorner on 10/28/23 at  2:40 PM EDT by a video enabled telemedicine application and verified that I am speaking with the correct person using two identifiers.   I discussed the limitations of evaluation and management by telemedicine and the availability of in person appointments. The patient expressed understanding and agreed to proceed.  Patient location: home Provider locations: office  Subjective:    CC: Shingles follow-up  HPI: Pleasant 83 year old female presenting via MyChart video visit with reports of being diagnosed with shingles on 10/10/2023.  She was seen in urgent care where they prescribed Valtrex 1000 mg twice daily for 10 days as well as triamcinolone topically.  She has been using the cream and has completed the Valtrex orally.  Notes that she is still having significant related pain along the area of the rash.  What was on her back has now crusted over however she has new lesions that are popping up on her left side and abdomen.  The area is very itchy but her biggest complaint is the pain.  Presents today asking for pain pills to help manage symptoms.   Past medical history, Surgical history, Family history not pertinant except as noted below, Social history, Allergies, and medications have been entered into the medical record, reviewed, and corrections made.   Review of Systems: See HPI for pertinent positives and negatives.   Objective:    General: Speaking clearly in complete sentences without any shortness of breath.  Alert and oriented x3.  Normal judgment. No apparent acute distress.  Impression and Recommendations:    1. Post herpetic neuralgia (Primary) Reviewed the trouble with treating postherpetic neuralgia as it is very difficult to treat nerve pain without causing excessive sedation.  Narcotics are usually not indicated as they are not effective against nerve pain.  Adding lidocaine patches to the  affected area for 12 hours, then remove for 12 hours.  Increasing gabapentin to 300 mg twice daily.  Okay to continue triamcinolone twice daily as needed.  She is hesitant to consider steroids because she does not remember if she is ever had them before and is worried about a reaction.  After discussion, sending in Medrol Dosepak and if no improvement in her symptoms with the increase in gabapentin and the addition of lidocaine patches, she will plan to start the steroid. - gabapentin (NEURONTIN) 100 MG capsule; Take 3 capsules (300 mg total) by mouth 2 (two) times daily.  Dispense: 60 capsule; Refill: 1   I discussed the assessment and treatment plan with the patient. The patient was provided an opportunity to ask questions and all were answered. The patient agreed with the plan and demonstrated an understanding of the instructions.   The patient was advised to call back or seek an in-person evaluation if the symptoms worsen or if the condition fails to improve as anticipated.  Return if symptoms worsen or fail to improve.  Maryl Snook, DNP, APRN, FNP-BC [facility]

## 2023-11-03 ENCOUNTER — Encounter: Payer: Self-pay | Admitting: Medical-Surgical

## 2023-11-03 MED ORDER — TRIAMCINOLONE ACETONIDE 0.1 % EX CREA: TOPICAL_CREAM | Freq: Two times a day (BID) | CUTANEOUS | 1 refills | Status: DC

## 2023-11-08 DIAGNOSIS — J9601 Acute respiratory failure with hypoxia: Secondary | ICD-10-CM | POA: Diagnosis not present

## 2023-11-24 ENCOUNTER — Ambulatory Visit (INDEPENDENT_AMBULATORY_CARE_PROVIDER_SITE_OTHER): Payer: 59 | Admitting: Medical-Surgical

## 2023-11-24 VITALS — BP 111/69 | HR 89 | Resp 20 | Ht 67.5 in | Wt 148.0 lb

## 2023-11-24 DIAGNOSIS — I10 Essential (primary) hypertension: Secondary | ICD-10-CM

## 2023-11-24 DIAGNOSIS — E559 Vitamin D deficiency, unspecified: Secondary | ICD-10-CM | POA: Diagnosis not present

## 2023-11-24 DIAGNOSIS — R413 Other amnesia: Secondary | ICD-10-CM

## 2023-11-24 DIAGNOSIS — J42 Unspecified chronic bronchitis: Secondary | ICD-10-CM

## 2023-11-24 DIAGNOSIS — I5032 Chronic diastolic (congestive) heart failure: Secondary | ICD-10-CM | POA: Diagnosis not present

## 2023-11-24 DIAGNOSIS — M810 Age-related osteoporosis without current pathological fracture: Secondary | ICD-10-CM | POA: Diagnosis not present

## 2023-11-24 DIAGNOSIS — E785 Hyperlipidemia, unspecified: Secondary | ICD-10-CM | POA: Diagnosis not present

## 2023-11-24 DIAGNOSIS — B0229 Other postherpetic nervous system involvement: Secondary | ICD-10-CM | POA: Diagnosis not present

## 2023-11-24 DIAGNOSIS — R7303 Prediabetes: Secondary | ICD-10-CM

## 2023-11-24 LAB — POCT GLYCOSYLATED HEMOGLOBIN (HGB A1C): Hemoglobin A1C: 5.7 % — AB (ref 4.0–5.6)

## 2023-11-24 MED ORDER — ALENDRONATE SODIUM 70 MG PO TABS: ORAL_TABLET | ORAL | 1 refills | Status: DC

## 2023-11-24 MED ORDER — AMLODIPINE BESYLATE 5 MG PO TABS: 5.0000 mg | ORAL_TABLET | Freq: Every day | ORAL | 3 refills | Status: DC

## 2023-11-24 MED ORDER — LIDOCAINE 5 % EX PTCH: 1.0000 | MEDICATED_PATCH | CUTANEOUS | 3 refills | Status: DC

## 2023-11-24 MED ORDER — LOSARTAN POTASSIUM 50 MG PO TABS: 50.0000 mg | ORAL_TABLET | Freq: Every day | ORAL | 3 refills | Status: DC

## 2023-11-24 MED ORDER — GABAPENTIN 100 MG PO CAPS: 300.0000 mg | ORAL_CAPSULE | Freq: Two times a day (BID) | ORAL | 3 refills | Status: AC

## 2023-11-24 MED ORDER — PRAVASTATIN SODIUM 40 MG PO TABS: 40.0000 mg | ORAL_TABLET | Freq: Every day | ORAL | 3 refills | Status: DC

## 2023-11-24 MED ORDER — TRIAMCINOLONE ACETONIDE 0.1 % EX CREA: TOPICAL_CREAM | Freq: Two times a day (BID) | CUTANEOUS | 0 refills | Status: AC

## 2023-11-24 NOTE — Progress Notes (Signed)
 Established patient visit  History, exam, impression, and plan:  1. Primary hypertension Very pleasant 83 year old female presenting today with a history of hypertension currently treated with amlodipine  5 mg daily and losartan  50 mg daily.  Tolerating both medications well without side effects.  Does spot check her blood pressure at home and reports her readings are at goal.  Follows a low-sodium diet.  Stays physically active as tolerated. Denies CP, SOB, palpitations, lower extremity edema, dizziness, headaches, or vision changes.  Blood pressure is stable and at goal today.  Checking labs as below.  Continue amlodipine  and losartan  as prescribed. - amLODipine  (NORVASC ) 5 MG tablet; Take 1 tablet (5 mg total) by mouth daily.  Dispense: 90 tablet; Refill: 3 - losartan  (COZAAR ) 50 MG tablet; Take 1 tablet (50 mg total) by mouth daily.  Dispense: 90 tablet; Refill: 3 - CBC - CMP14+EGFR - Lipid panel  2. Chronic diastolic congestive heart failure (HCC) Has been doing very well overall and is controlling her hypertension beautifully.  Denies any concern for lower extremity edema, shortness of breath, orthopnea, chest pain, or dizziness.  Cardiopulmonary exam is normal today with no peripheral edema noted.  Lungs are clear with even and unlabored respirations.  3. Chronic bronchitis, unspecified chronic bronchitis type (HCC) Long history of smoking and now dealing with chronic bronchitis.  Reports that she is doing well overall and has no concerns for shortness of breath while at rest.  She does get dyspneic on exertion at times.  Has an albuterol  inhaler to use which works well for her when needed.  Cardiopulmonary exam is normal today.  Continue to use albuterol  as needed.  Continue to avoid smoking.  4. Osteoporosis of femur without pathological fracture Last DEXA scan in 2022 showed a T-score of -3.4.  She has been taking Fosamax  70 mg once weekly, tolerating well without side effects.   Stays physically active but is at high risk for fractures.  Checking metabolic panel today.  Ordering DEXA scan for update to evaluate response and tolerance to the medication. - alendronate  (FOSAMAX ) 70 MG tablet; Take with a full glass of water on an empty stomach.TAKE 1 TABLET BY MOUTH WEEKLY  WITH 8 OZ OF PLAIN WATER 30  MINUTES BEFORE FIRST FOOD, DRINK OR MEDS. STAY UPRIGHT FOR 30  MINS  Dispense: 12 tablet; Refill: 1 - DG Bone Density; Future - CMP14+EGFR  5. Prediabetes (Primary) POCT hemoglobin A1c at 5.9% at her last visit.  With her recent bout of shingles and postherpetic neuralgia, she has not been eating as much as she usually does.  Recheck of her A1c today at 5.4% indicating that she has done well bringing her overall glucose down.  Continue to avoid simple carbohydrates and concentrated sweets as much as possible. - POCT HgB A1C  6. Dyslipidemia History of dyslipidemia currently treated with pravastatin  40 mg daily.  Tolerating medication well without side effects.  Following a low-fat heart healthy diet.  Activity as tolerated.  Denies any concerning symptoms.  Checking labs as below.  Continue pravastatin  as prescribed. - pravastatin  (PRAVACHOL ) 40 MG tablet; Take 1 tablet (40 mg total) by mouth daily.  Dispense: 90 tablet; Refill: 3 - CMP14+EGFR - Lipid panel  8. Post herpetic neuralgia Unfortunately, she is still experiencing postherpetic neuralgia from the recent shingles outbreak.  Has been using lidocaine  patches, triamcinolone  cream, and gabapentin  for pain control.  Notes that these 3 do help however she continues to have periodic  episodes of severe pain.  She is requesting refills on her medications today.  Sending refills of lidocaine  patches and triamcinolone  cream.  Continue gabapentin  300 mg twice daily as prescribed. - gabapentin  (NEURONTIN ) 100 MG capsule; Take 3 capsules (300 mg total) by mouth 2 (two) times daily.  Dispense: 180 capsule; Refill: 3  9. Memory  changes Endorses memory changes that have been present for greater than 6 months.  Denies any worsening of the memory changes but is concerned.  Most of the time she has difficulty with short-term memory while her long-term memory is intact.  Suspect this is likely result of aging and to be expected but we will plan to check labs as below to rule out metabolic etiology.  Offered referral to neuropsychiatry for full evaluation however she declined at this time. - TSH - B12 and Folate Panel - C-reactive protein - Sed Rate (ESR)   Procedures performed this visit: None.  Return in about 6 months (around 05/26/2024) for chronic disease follow up.  __________________________________ Angelica Snook, DNP, APRN, FNP-BC Primary Care and Sports Medicine [facility] [place]

## 2023-11-25 ENCOUNTER — Ambulatory Visit: Payer: Self-pay | Admitting: Medical-Surgical

## 2023-11-25 LAB — SEDIMENTATION RATE: Sed Rate: 19 mm/h (ref 0–40)

## 2023-11-25 LAB — CMP14+EGFR
ALT: 26 IU/L (ref 0–32)
AST: 28 IU/L (ref 0–40)
Albumin: 4.1 g/dL (ref 3.7–4.7)
Alkaline Phosphatase: 63 IU/L (ref 44–121)
BUN/Creatinine Ratio: 14 (ref 12–28)
BUN: 17 mg/dL (ref 8–27)
Bilirubin Total: 0.3 mg/dL (ref 0.0–1.2)
CO2: 25 mmol/L (ref 20–29)
Calcium: 8.8 mg/dL (ref 8.7–10.3)
Chloride: 104 mmol/L (ref 96–106)
Creatinine, Ser: 1.2 mg/dL — ABNORMAL HIGH (ref 0.57–1.00)
Globulin, Total: 2.5 g/dL (ref 1.5–4.5)
Glucose: 98 mg/dL (ref 70–99)
Potassium: 3.8 mmol/L (ref 3.5–5.2)
Sodium: 145 mmol/L — ABNORMAL HIGH (ref 134–144)
Total Protein: 6.6 g/dL (ref 6.0–8.5)
eGFR: 45 mL/min/{1.73_m2} — ABNORMAL LOW (ref >59)

## 2023-11-25 LAB — LIPID PANEL
Chol/HDL Ratio: 3.3 ratio (ref 0.0–4.4)
Cholesterol, Total: 127 mg/dL (ref 100–199)
HDL: 38 mg/dL — ABNORMAL LOW (ref >39)
LDL Chol Calc (NIH): 72 mg/dL (ref 0–99)
Triglycerides: 85 mg/dL (ref 0–149)
VLDL Cholesterol Cal: 17 mg/dL (ref 5–40)

## 2023-11-25 LAB — CBC
Hematocrit: 41.5 % (ref 34.0–46.6)
Hemoglobin: 13.8 g/dL (ref 11.1–15.9)
MCH: 32.4 pg (ref 26.6–33.0)
MCHC: 33.3 g/dL (ref 31.5–35.7)
MCV: 97 fL (ref 79–97)
Platelets: 201 10*3/uL (ref 150–450)
RBC: 4.26 x10E6/uL (ref 3.77–5.28)
RDW: 13.2 % (ref 11.7–15.4)
WBC: 5.3 10*3/uL (ref 3.4–10.8)

## 2023-11-25 LAB — B12 AND FOLATE PANEL
Folate: 4.7 ng/mL (ref >3.0)
Vitamin B-12: 395 pg/mL (ref 232–1245)

## 2023-11-25 LAB — C-REACTIVE PROTEIN: CRP: 3 mg/L (ref 0–10)

## 2023-11-25 LAB — TSH: TSH: 0.62 u[IU]/mL (ref 0.450–4.500)

## 2023-11-26 ENCOUNTER — Other Ambulatory Visit: Payer: Self-pay | Admitting: Medical-Surgical

## 2023-11-26 DIAGNOSIS — M810 Age-related osteoporosis without current pathological fracture: Secondary | ICD-10-CM

## 2023-12-08 DIAGNOSIS — J9601 Acute respiratory failure with hypoxia: Secondary | ICD-10-CM | POA: Diagnosis not present

## 2023-12-17 ENCOUNTER — Other Ambulatory Visit: Payer: Self-pay | Admitting: Medical-Surgical

## 2023-12-17 DIAGNOSIS — E785 Hyperlipidemia, unspecified: Secondary | ICD-10-CM

## 2023-12-17 DIAGNOSIS — I10 Essential (primary) hypertension: Secondary | ICD-10-CM

## 2024-01-08 DIAGNOSIS — J9601 Acute respiratory failure with hypoxia: Secondary | ICD-10-CM | POA: Diagnosis not present

## 2024-02-07 DIAGNOSIS — J9601 Acute respiratory failure with hypoxia: Secondary | ICD-10-CM | POA: Diagnosis not present

## 2024-02-29 ENCOUNTER — Other Ambulatory Visit: Payer: Self-pay | Admitting: Medical-Surgical

## 2024-02-29 DIAGNOSIS — M810 Age-related osteoporosis without current pathological fracture: Secondary | ICD-10-CM

## 2024-03-09 DIAGNOSIS — J9601 Acute respiratory failure with hypoxia: Secondary | ICD-10-CM | POA: Diagnosis not present

## 2024-03-16 ENCOUNTER — Encounter: Payer: Self-pay | Admitting: Sports Medicine

## 2024-04-01 ENCOUNTER — Other Ambulatory Visit: Payer: Self-pay | Admitting: Medical-Surgical

## 2024-04-09 DIAGNOSIS — J9601 Acute respiratory failure with hypoxia: Secondary | ICD-10-CM | POA: Diagnosis not present

## 2024-04-19 ENCOUNTER — Other Ambulatory Visit: Payer: Self-pay | Admitting: Medical-Surgical

## 2024-04-19 DIAGNOSIS — M810 Age-related osteoporosis without current pathological fracture: Secondary | ICD-10-CM

## 2024-04-28 ENCOUNTER — Other Ambulatory Visit: Payer: Self-pay | Admitting: Medical-Surgical

## 2024-04-29 NOTE — Telephone Encounter (Signed)
 Last filled 04/01/2024  Last OV 11/24/2023  Upcoming appointment 05/31/2024

## 2024-05-08 ENCOUNTER — Other Ambulatory Visit: Payer: Self-pay | Admitting: Medical-Surgical

## 2024-05-08 DIAGNOSIS — I5032 Chronic diastolic (congestive) heart failure: Secondary | ICD-10-CM

## 2024-05-09 DIAGNOSIS — J9601 Acute respiratory failure with hypoxia: Secondary | ICD-10-CM | POA: Diagnosis not present

## 2024-05-20 ENCOUNTER — Encounter

## 2024-05-31 ENCOUNTER — Ambulatory Visit: Admitting: Medical-Surgical

## 2024-05-31 ENCOUNTER — Encounter: Payer: Self-pay | Admitting: Medical-Surgical

## 2024-05-31 VITALS — BP 161/75 | HR 88 | Resp 20 | Ht 67.5 in | Wt 157.0 lb

## 2024-05-31 DIAGNOSIS — I5032 Chronic diastolic (congestive) heart failure: Secondary | ICD-10-CM

## 2024-05-31 DIAGNOSIS — N1831 Chronic kidney disease, stage 3a: Secondary | ICD-10-CM | POA: Diagnosis not present

## 2024-05-31 DIAGNOSIS — M1990 Unspecified osteoarthritis, unspecified site: Secondary | ICD-10-CM | POA: Insufficient documentation

## 2024-05-31 DIAGNOSIS — B0229 Other postherpetic nervous system involvement: Secondary | ICD-10-CM | POA: Insufficient documentation

## 2024-05-31 DIAGNOSIS — R7303 Prediabetes: Secondary | ICD-10-CM | POA: Diagnosis not present

## 2024-05-31 DIAGNOSIS — M810 Age-related osteoporosis without current pathological fracture: Secondary | ICD-10-CM

## 2024-05-31 DIAGNOSIS — Z23 Encounter for immunization: Secondary | ICD-10-CM

## 2024-05-31 DIAGNOSIS — I10 Essential (primary) hypertension: Secondary | ICD-10-CM | POA: Diagnosis not present

## 2024-05-31 LAB — POCT GLYCOSYLATED HEMOGLOBIN (HGB A1C): Hemoglobin A1C: 5.4 % (ref 4.0–5.6)

## 2024-05-31 MED ORDER — CALCIUM 600+D PLUS MINERALS 600-400 MG-UNIT PO TABS: ORAL_TABLET | ORAL | 3 refills | Status: AC

## 2024-05-31 MED ORDER — ALENDRONATE SODIUM 70 MG PO TABS: ORAL_TABLET | ORAL | 3 refills | Status: DC

## 2024-05-31 MED ORDER — FUROSEMIDE 40 MG PO TABS: 40.0000 mg | ORAL_TABLET | Freq: Every day | ORAL | 3 refills | Status: DC

## 2024-05-31 NOTE — Progress Notes (Signed)
 Established patient visit   History of Present Illness   Discussed the use of AI scribe software for clinical note transcription with the patient, who gave verbal consent to proceed.  History of Present Illness   Angelica Mcbride is an 83 year old female who presents for follow-up of her chronic conditions.  Postherpetic neuralgia - Persistent back pain at the site of a previous rash - Uses lidocaine  patches with effective pain relief - Continues gabapentin  for nerve pain without excessive sleepiness  Dyspnea and dizziness - Experiences dizziness and shortness of breath while ambulating at home - Uses an oxygen  tank for symptom management  Hypertension - Blood pressure is elevated today due to not taking her morning medications yet - Current antihypertensive medications include amlodipine  5 mg, losartan  50 mg, and furosemide  40mg  daily - Occasional home monitoring but only when she is feeling bad  Prediabetes - Last A1c was 5.7% in May  Osteoporosis - Takes Fosamax  70mg  once weekly, along with calcium  and vitamin D  supplements - Due for a DEXA scan to update bone density status  Arthralgia and foot pain - 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  Constitutional:      General: She is not in acute distress.    Appearance: Normal appearance.  HENT:     Head: Normocephalic and atraumatic.  Cardiovascular:     Rate and Rhythm: Normal rate and regular rhythm.     Pulses: Normal pulses.     Heart sounds: Normal heart sounds. No murmur heard.    No friction rub. No gallop.  Pulmonary:     Effort: Pulmonary effort is normal. No respiratory distress.     Breath sounds: Normal breath sounds. No wheezing.  Skin:    General: Skin is warm and dry.  Neurological:     Mental Status: She is alert and oriented to person, place, and time.  Psychiatric:        Mood and  Affect: Mood normal.        Behavior: Behavior normal.        Thought Content: Thought content normal.        Judgment: Judgment normal.    Assessment & Plan   Chronic diastolic heart failure and essential hypertension Blood pressure elevated due to missed medications this morning. No recent headaches, palpitations, or chest pain. Dizziness and dyspnea present, requiring oxygen . - Continue amlodipine  5 mg, losartan  50 mg, and furosemide  40 mg daily. - Checking BMET. - Encouraged more regular BP home monitoring.  Chronic kidney disease, stage 3a (HCC) Kidney function stable but requires monitoring due to furosemide  use. Discussed diuretic use and kidney stress balance. - Recheck kidney function.  Prediabetes Last hemoglobin A1c in the prediabetic range at 5.7% 6 months ago.  Recheck today showing improvement to 5.4% without medications. - Continue dietary/lifestyle modifications. - Recheck A1c in 6 months.  Age-related osteoporosis Continues Fosamax , calcium , and vitamin D . DEXA scan needed for bone density update. Previous billing issues noted. - Continue Fosamax , calcium , and vitamin D . - Schedule DEXA scan and address billing issues with radiology.  Arthritis Reports arthritis in toes.  Plans to use lidocaine  patches for pain. Discussed alternative treatments. - Continue lidocaine  patches for pain management. - Consider Tiger Balm for additional pain relief.  Peripheral neuropathic pain Continues gabapentin  without significant side effects.  Finds topical lidocaine   patches effective. - Continue gabapentin  and lidocaine  patches for nerve pain management.  General Health Maintenance Due for flu shot. Shingles vaccine not administered due to pharmacy requirement. Discussed billing issues with DEXA scan. - Administered flu shot. - Advised to obtain shingles vaccine at pharmacy. - Addressed billing issues with previous DEXA scan.        Follow up   Return in about 6  months (around 11/28/2024) for chronic disease follow up. __________________________________ Angelica FREDRIK Palin, DNP, APRN, FNP-BC Primary Care and Sports Medicine [facility] [place]

## 2024-06-01 ENCOUNTER — Ambulatory Visit: Payer: Self-pay | Admitting: Medical-Surgical

## 2024-06-01 LAB — BASIC METABOLIC PANEL WITH GFR
BUN/Creatinine Ratio: 11 — ABNORMAL LOW (ref 12–28)
BUN: 12 mg/dL (ref 8–27)
CO2: 24 mmol/L (ref 20–29)
Calcium: 8.7 mg/dL (ref 8.7–10.3)
Chloride: 104 mmol/L (ref 96–106)
Creatinine, Ser: 1.1 mg/dL — ABNORMAL HIGH (ref 0.57–1.00)
Glucose: 84 mg/dL (ref 70–99)
Potassium: 4.1 mmol/L (ref 3.5–5.2)
Sodium: 147 mmol/L — ABNORMAL HIGH (ref 134–144)
eGFR: 50 mL/min/1.73 — ABNORMAL LOW (ref >59)

## 2024-06-02 NOTE — Progress Notes (Signed)
 Called patient and spoke with Stefanie after I called [facility]. For what they could tell me at [facility] is that the patient does not have a outstanding balance nor was she billed for anything around that DOS. I advised Stefanie to called [facility] [employer] to confirm that her mother's deductible has been met and that the Bone Density scan will be covered for this year. They are limited with speaking with me because I am not the patien or POA.

## 2024-06-18 ENCOUNTER — Other Ambulatory Visit: Payer: Self-pay | Admitting: Medical-Surgical

## 2024-06-21 ENCOUNTER — Other Ambulatory Visit: Payer: Self-pay | Admitting: Medical-Surgical

## 2024-06-21 DIAGNOSIS — I5032 Chronic diastolic (congestive) heart failure: Secondary | ICD-10-CM

## 2024-06-21 NOTE — Telephone Encounter (Signed)
 Requesting rx rf of lidocaine  patch  Last written as lidocaine  5% 04/29/2024 Last OV 05/31/2024 Upcoming appt 11/29/2024  Note from 05/31/2024 Postherpetic neuralgia - Persistent back pain at the site of a previous rash - Uses lidocaine  patches with effective pain relief - Continues gabapentin  for nerve pain without excessive sleepiness

## 2024-06-26 ENCOUNTER — Other Ambulatory Visit: Payer: Self-pay | Admitting: Medical-Surgical

## 2024-06-26 DIAGNOSIS — M810 Age-related osteoporosis without current pathological fracture: Secondary | ICD-10-CM

## 2024-11-29 ENCOUNTER — Ambulatory Visit: Admitting: Medical-Surgical
